# Patient Record
Sex: Female | Born: 1980 | Race: Black or African American | Hispanic: No | Marital: Single | Smoking: Never smoker
Health system: Southern US, Community
[De-identification: ages and names within clinical notes are randomized; demographics above are authoritative.]

## PROBLEM LIST (undated history)

## (undated) ENCOUNTER — Inpatient Hospital Stay (HOSPITAL_COMMUNITY): Payer: Self-pay

## (undated) DIAGNOSIS — H269 Unspecified cataract: Secondary | ICD-10-CM

## (undated) DIAGNOSIS — M1990 Unspecified osteoarthritis, unspecified site: Secondary | ICD-10-CM

## (undated) DIAGNOSIS — K219 Gastro-esophageal reflux disease without esophagitis: Secondary | ICD-10-CM

## (undated) DIAGNOSIS — F32A Depression, unspecified: Secondary | ICD-10-CM

## (undated) DIAGNOSIS — F419 Anxiety disorder, unspecified: Secondary | ICD-10-CM

## (undated) DIAGNOSIS — D649 Anemia, unspecified: Secondary | ICD-10-CM

## (undated) HISTORY — PX: EYE SURGERY: SHX253

## (undated) HISTORY — DX: Depression, unspecified: F32.A

## (undated) HISTORY — DX: Unspecified osteoarthritis, unspecified site: M19.90

## (undated) HISTORY — DX: Gastro-esophageal reflux disease without esophagitis: K21.9

## (undated) HISTORY — DX: Anxiety disorder, unspecified: F41.9

## (undated) HISTORY — DX: Anemia, unspecified: D64.9

## (undated) HISTORY — DX: Unspecified cataract: H26.9

---

## 2003-08-02 ENCOUNTER — Emergency Department (HOSPITAL_COMMUNITY): Admission: EM | Admit: 2003-08-02 | Discharge: 2003-08-02 | Payer: Self-pay | Admitting: Family Medicine

## 2004-05-04 ENCOUNTER — Emergency Department (HOSPITAL_COMMUNITY): Admission: EM | Admit: 2004-05-04 | Discharge: 2004-05-04 | Payer: Self-pay | Admitting: Family Medicine

## 2007-06-29 ENCOUNTER — Encounter: Admission: RE | Admit: 2007-06-29 | Discharge: 2007-06-29 | Payer: Self-pay | Admitting: Endocrinology

## 2011-11-01 ENCOUNTER — Ambulatory Visit (INDEPENDENT_AMBULATORY_CARE_PROVIDER_SITE_OTHER): Payer: BC Managed Care – PPO | Admitting: Physician Assistant

## 2011-11-01 VITALS — BP 128/84 | HR 86 | Temp 98.2°F | Resp 16 | Ht 67.5 in | Wt 257.0 lb

## 2011-11-01 DIAGNOSIS — J9801 Acute bronchospasm: Secondary | ICD-10-CM

## 2011-11-01 DIAGNOSIS — J019 Acute sinusitis, unspecified: Secondary | ICD-10-CM

## 2011-11-01 DIAGNOSIS — R05 Cough: Secondary | ICD-10-CM

## 2011-11-01 DIAGNOSIS — R059 Cough, unspecified: Secondary | ICD-10-CM

## 2011-11-01 DIAGNOSIS — H6690 Otitis media, unspecified, unspecified ear: Secondary | ICD-10-CM

## 2011-11-01 MED ORDER — FLUTICASONE PROPIONATE 50 MCG/ACT NA SUSP: 2.0000 | Freq: Every day | NASAL | Status: DC

## 2011-11-01 MED ORDER — AMOXICILLIN 500 MG PO CAPS: ORAL_CAPSULE | ORAL | Status: DC

## 2011-11-01 MED ORDER — ALBUTEROL SULFATE HFA 108 (90 BASE) MCG/ACT IN AERS: 2.0000 | INHALATION_SPRAY | Freq: Four times a day (QID) | RESPIRATORY_TRACT | Status: DC | PRN

## 2011-11-01 NOTE — Patient Instructions (Addendum)
-   Increase fluids and rest

## 2011-11-01 NOTE — Progress Notes (Signed)
  Subjective:    Patient ID: Patricia Boyer, female    DOB: 06/04/1980, 31 y.o.   MRN: [id number]  HPI 31 yr old AAF presents with a 5 day h/o sinus congestion, L ear pain, cough, wheezing, and she feels like she is getting worse.  Taking Aleve with little relief.  No f/c.  She has a h/o having to use an inhaler when she has URI.  She is a Proofreader.  She denies any health problems.  Review of Systems  All other systems reviewed and are negative.       Objective:   Physical Exam  Nursing note and vitals reviewed. Constitutional: She is oriented to person, place, and time. She appears well-developed and well-nourished.  HENT:  Head: Normocephalic and atraumatic.  Mouth/Throat: Oropharynx is clear and moist. No oropharyngeal exudate.       B TM bulging.  L is erythematous and landmarks slightly distorted.  Turbinates inflamed and greenish thick mucus in R nostril.  R maxillary TTP.  Cardiovascular: Normal rate, regular rhythm and normal heart sounds.   Pulmonary/Chest: Effort normal. No respiratory distress. She has wheezes (minimal wheezing with forced expirations.). She has no rales.  Neurological: She is alert and oriented to person, place, and time.  Skin: Skin is warm and dry.  Psychiatric: She has a normal mood and affect. Her behavior is normal.        Assessment & Plan:  Sinusitis with L AOM-add mucinex DM and sudafed. Fluids, rest.  See Rxs.  OOW tomorrow.

## 2011-11-13 ENCOUNTER — Telehealth: Payer: Self-pay

## 2011-11-13 NOTE — Telephone Encounter (Signed)
PATIENT STATES SHE SAW Patricia MCCLUNG A COUPLE OF WEEKS AGO FOR A SINUS INFECTION. SHE HAS FINISHED HER AMOXICILLIN AND IS STILL TAKING MUCINEX,FLONASE & SUDAFED. SHE STILL CONTINUES TO HAVE A SORE THROAT, HEAD & CHEST CONGESTION AND COUGH. SHOULD SHE GET A REFILL ON THE ANTIBOTIC OR BE PRESCRIBED SOMETHING ELSE? BEST PHONE [phone] (CELL)   PHARMACY CHOICE IS [facility] ON [address].  MBC

## 2011-11-14 MED ORDER — IPRATROPIUM BROMIDE 0.03 % NA SOLN: 2.0000 | Freq: Two times a day (BID) | NASAL | Status: DC

## 2011-11-14 MED ORDER — CEFDINIR 300 MG PO CAPS: 600.0000 mg | ORAL_CAPSULE | Freq: Every day | ORAL | Status: DC

## 2011-11-14 NOTE — Telephone Encounter (Signed)
Pt notified and agrees to RTC if not better in 5-7 days. JF

## 2011-11-14 NOTE — Telephone Encounter (Signed)
Please advise the patient to continue the Flonase.  I've sent in a different antibiotic and another nasal spray to ADD.  Rest, drink at least 64 ounces of water daily, and RTC if these additions do not improve/resolve her symptoms in the next 5-7 days, sooner if they worsen.

## 2012-05-17 ENCOUNTER — Ambulatory Visit (INDEPENDENT_AMBULATORY_CARE_PROVIDER_SITE_OTHER): Payer: BC Managed Care – PPO | Admitting: Internal Medicine

## 2012-05-17 ENCOUNTER — Ambulatory Visit: Payer: BC Managed Care – PPO

## 2012-05-17 VITALS — BP 141/71 | HR 85 | Temp 98.6°F | Resp 17 | Ht 68.0 in | Wt 253.0 lb

## 2012-05-17 DIAGNOSIS — R51 Headache: Secondary | ICD-10-CM

## 2012-05-17 DIAGNOSIS — R519 Headache, unspecified: Secondary | ICD-10-CM

## 2012-05-17 DIAGNOSIS — R5383 Other fatigue: Secondary | ICD-10-CM

## 2012-05-17 DIAGNOSIS — M542 Cervicalgia: Secondary | ICD-10-CM

## 2012-05-17 DIAGNOSIS — K219 Gastro-esophageal reflux disease without esophagitis: Secondary | ICD-10-CM

## 2012-05-17 DIAGNOSIS — R5381 Other malaise: Secondary | ICD-10-CM

## 2012-05-17 DIAGNOSIS — Z6838 Body mass index (BMI) 38.0-38.9, adult: Secondary | ICD-10-CM | POA: Insufficient documentation

## 2012-05-17 DIAGNOSIS — G4719 Other hypersomnia: Secondary | ICD-10-CM

## 2012-05-17 LAB — COMPREHENSIVE METABOLIC PANEL
ALT: 15 U/L (ref 0–35)
AST: 17 U/L (ref 0–37)
Calcium: 9.4 mg/dL (ref 8.4–10.5)
Chloride: 104 mEq/L (ref 96–112)
Creat: 0.91 mg/dL (ref 0.50–1.10)
Total Bilirubin: 0.7 mg/dL (ref 0.3–1.2)

## 2012-05-17 LAB — POCT CBC
Granulocyte percent: 69.5 %G (ref 37–80)
Hemoglobin: 14.7 g/dL (ref 12.2–16.2)
Lymph, poc: 1.6 (ref 0.6–3.4)
MCHC: 32.2 g/dL (ref 31.8–35.4)
MPV: 8.9 fL (ref 0–99.8)
POC Granulocyte: 4.7 (ref 2–6.9)
POC MID %: 7.1 %M (ref 0–12)

## 2012-05-17 LAB — POCT SEDIMENTATION RATE: POCT SED RATE: 13 mm/hr (ref 0–22)

## 2012-05-17 MED ORDER — CYCLOBENZAPRINE HCL 10 MG PO TABS: 10.0000 mg | ORAL_TABLET | Freq: Every day | ORAL | Status: DC

## 2012-05-17 MED ORDER — BUTALBITAL-APAP-CAFFEINE 50-325-40 MG PO TABS: 1.0000 | ORAL_TABLET | Freq: Two times a day (BID) | ORAL | Status: DC | PRN

## 2012-05-17 MED ORDER — MELOXICAM 15 MG PO TABS: 15.0000 mg | ORAL_TABLET | Freq: Every day | ORAL | Status: DC
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
## 2012-05-19 ENCOUNTER — Encounter: Payer: Self-pay | Admitting: Internal Medicine

## 2012-05-19 MED ORDER — ESOMEPRAZOLE MAGNESIUM 40 MG PO CPDR: 40.0000 mg | DELAYED_RELEASE_CAPSULE | Freq: Every day | ORAL | Status: DC

## 2012-05-19 NOTE — Addendum Note (Signed)
Addended by: Tonye Pearson on: 05/19/2012 01:33 PM   Modules accepted: Orders

## 2012-05-28 ENCOUNTER — Telehealth: Payer: Self-pay

## 2012-05-28 NOTE — Telephone Encounter (Signed)
PT STATES SHE HAD TESTS RUN AND WOULD LIKE TO KNOW RESULTS. SHE IS STILL REALLY HOARSE AND DIDN'T KNOW WHAT TO DO. PLEASE CALL PT AT [phone]

## 2012-05-28 NOTE — Telephone Encounter (Signed)
Left a message for patient to return call. Dr. Merla Riches, can you please comment on the labs on 05/17/12

## 2012-05-29 ENCOUNTER — Telehealth: Payer: Self-pay | Admitting: Radiology

## 2012-05-29 NOTE — Telephone Encounter (Signed)
Thanks, I have advised patient of lab results.

## 2012-05-29 NOTE — Telephone Encounter (Signed)
Spoke to patient regarding her complaints, she states she has pain with her neck/ head. She states she can not take Meloxicam at work because it makes her tired, when she takes it , it is helpful. She states her voice is hoarse. Advised not normal to have hoarseness for 2 weeks. I did advise her labs normal, you can disregard the previous message regarding them. Please advise, she is very concerned about her voice. I think she should return to clinic, but she advised she plans to come in as planned with you next week, Noelle Hoogland

## 2012-05-29 NOTE — Telephone Encounter (Signed)
Please see the letter I sent her

## 2012-06-02 ENCOUNTER — Encounter: Payer: Self-pay | Admitting: Internal Medicine

## 2012-06-02 ENCOUNTER — Ambulatory Visit (INDEPENDENT_AMBULATORY_CARE_PROVIDER_SITE_OTHER): Payer: BC Managed Care – PPO | Admitting: Internal Medicine

## 2012-06-02 VITALS — BP 122/88 | HR 78 | Temp 98.4°F | Resp 16 | Ht 68.0 in | Wt 259.0 lb

## 2012-06-02 DIAGNOSIS — R490 Dysphonia: Secondary | ICD-10-CM

## 2012-06-02 NOTE — Progress Notes (Signed)
  Subjective:    Patient ID: Patricia Boyer, female    DOB: 15-Apr-1980, 32 y.o.   MRN: [id number]  HPI See last ov Still hoarse all day long Labs were all normal nexium started last ov--belching better but has recurred last 2 days/did not have classic reflux sxt No allergic rhinitis  No cough   Review of Systems Insomnia thurs to sat last week for ? reason    Objective:   Physical Exam BP 122/88  Pulse 78  Temp(Src) 98.4 F (36.9 C)  Resp 16  Ht 5\' 8"  (1.727 m)  Wt 259 lb (117.482 kg)  BMI 39.39 kg/m2  LMP 05/17/2012 Perrla/conj clear Tms clear Nares boggy  thr clear No nodes or thyromeg Lungs clear       Assessment & Plan:  #1 persistent hoarseness To ENT next

## 2012-06-18 ENCOUNTER — Ambulatory Visit (INDEPENDENT_AMBULATORY_CARE_PROVIDER_SITE_OTHER): Payer: BC Managed Care – PPO | Admitting: Family Medicine

## 2012-06-18 VITALS — BP 140/94 | HR 85 | Temp 97.9°F | Resp 18 | Ht 68.0 in | Wt 263.0 lb

## 2012-06-18 DIAGNOSIS — J329 Chronic sinusitis, unspecified: Secondary | ICD-10-CM

## 2012-06-18 MED ORDER — FLUTICASONE PROPIONATE 50 MCG/ACT NA SUSP: 2.0000 | Freq: Every day | NASAL | Status: DC

## 2012-06-18 MED ORDER — AMOXICILLIN-POT CLAVULANATE 875-125 MG PO TABS: 1.0000 | ORAL_TABLET | Freq: Two times a day (BID) | ORAL | Status: DC

## 2012-06-18 NOTE — Progress Notes (Signed)
Patient is here for followup of her continued hoarseness. Patient was seen by ear nose and throat who stated that she did have inflammation of her vocal cords but they felt it would go away. They did not see any cyst. Patient continues to take the reflex medication without any side effects. Patient has changed her diet and states that this has not made any improvement either. Patient now states that she is starting to cough and had a significant amount of sinus congestion. Patient feels like she is attempting to get cold. Patient denies any trouble with swallowing and denies any shortness of breath. Patient does states that she is starting to get a headache more than usual. Denies any changes in vision.  Past medical history, social, surgical and family history all reviewed.   Review of systems is negative for fever, chills, any abnormal weight loss.  Physical Exam:  Blood pressure 140/94, pulse 85, temperature 97.9 F (36.6 C), temperature source Oral, resp. rate 18, height 5\' 8"  (1.727 m), weight 263 lb (119.296 kg), last menstrual period 05/05/2012, SpO2 100.00%. General: NAD, alert HEENT:  Posterior pharynx is erythremic with PND, TM visualized bilaterally with air-fluid levels. Patient does have tenderness to the maxillary sinuses bilaterally. Pupils are equal round reactive to light and accommodation, Patient does have some thyromegaly with no nodule present. extraocular movements intact. Moist mucous membranes Pulmonary: Clear to auscultation bilaterally Cardiac: Regular rate and rhythm no murmur appreciated Skin: No rash present.   Assessment:  Continued horseness with findings of vocal cord irratation by ENT evaluation and now developing Sinusitis.   Plan: Will treat as sinusitis that is occuring due to duration of symptoms, flonase for PND which may help with and likely seasonal allergies.  Patient will come back again in 3 weeks for further evaluation if contnue to have symptoms.

## 2012-06-18 NOTE — Patient Instructions (Signed)
I do think you will get better but it does appear you are developing sinus infection.   Try the medications I sent in for you Come back in 3 weeks if not better.   Sinusitis Sinusitis is redness, soreness, and swelling (inflammation) of the paranasal sinuses. Paranasal sinuses are air pockets within the bones of your face (beneath the eyes, the middle of the forehead, or above the eyes). In healthy paranasal sinuses, mucus is able to drain out, and air is able to circulate through them by way of your nose. However, when your paranasal sinuses are inflamed, mucus and air can become trapped. This can allow bacteria and other germs to grow and cause infection. Sinusitis can develop quickly and last only a short time (acute) or continue over a long period (chronic). Sinusitis that lasts for more than 12 weeks is considered chronic.  CAUSES  Causes of sinusitis include:  Allergies.  Structural abnormalities, such as displacement of the cartilage that separates your nostrils (deviated septum), which can decrease the air flow through your nose and sinuses and affect sinus drainage.  Functional abnormalities, such as when the small hairs (cilia) that line your sinuses and help remove mucus do not work properly or are not present. SYMPTOMS  Symptoms of acute and chronic sinusitis are the same. The primary symptoms are pain and pressure around the affected sinuses. Other symptoms include:  Upper toothache.  Earache.  Headache.  Bad breath.  Decreased sense of smell and taste.  A cough, which worsens when you are lying flat.  Fatigue.  Fever.  Thick drainage from your nose, which often is green and may contain pus (purulent).  Swelling and warmth over the affected sinuses. DIAGNOSIS  Your caregiver will perform a physical exam. During the exam, your caregiver may:  Look in your nose for signs of abnormal growths in your nostrils (nasal polyps).  Tap over the affected sinus to check  for signs of infection.  View the inside of your sinuses (endoscopy) with a special imaging device with a light attached (endoscope), which is inserted into your sinuses. If your caregiver suspects that you have chronic sinusitis, one or more of the following tests may be recommended:  Allergy tests.  Nasal culture A sample of mucus is taken from your nose and sent to a lab and screened for bacteria.  Nasal cytology A sample of mucus is taken from your nose and examined by your caregiver to determine if your sinusitis is related to an allergy. TREATMENT  Most cases of acute sinusitis are related to a viral infection and will resolve on their own within 10 days. Sometimes medicines are prescribed to help relieve symptoms (pain medicine, decongestants, nasal steroid sprays, or saline sprays).  However, for sinusitis related to a bacterial infection, your caregiver will prescribe antibiotic medicines. These are medicines that will help kill the bacteria causing the infection.  Rarely, sinusitis is caused by a fungal infection. In theses cases, your caregiver will prescribe antifungal medicine. For some cases of chronic sinusitis, surgery is needed. Generally, these are cases in which sinusitis recurs more than 3 times per year, despite other treatments. HOME CARE INSTRUCTIONS   Drink plenty of water. Water helps thin the mucus so your sinuses can drain more easily.  Use a humidifier.  Inhale steam 3 to 4 times a day (for example, sit in the bathroom with the shower running).  Apply a warm, moist washcloth to your face 3 to 4 times a day, or as  directed by your caregiver.  Use saline nasal sprays to help moisten and clean your sinuses.  Take over-the-counter or prescription medicines for pain, discomfort, or fever only as directed by your caregiver. SEEK IMMEDIATE MEDICAL CARE IF:  You have increasing pain or severe headaches.  You have nausea, vomiting, or drowsiness.  You have  swelling around your face.  You have vision problems.  You have a stiff neck.  You have difficulty breathing. MAKE SURE YOU:   Understand these instructions.  Will watch your condition.  Will get help right away if you are not doing well or get worse. Document Released: 01/29/2005 Document Revised: 04/23/2011 Document Reviewed: 02/13/2011 [facility] Patient Information 2013 [place], [place].

## 2012-06-19 ENCOUNTER — Telehealth: Payer: Self-pay

## 2012-06-19 MED ORDER — BENZONATATE 100 MG PO CAPS: 100.0000 mg | ORAL_CAPSULE | Freq: Three times a day (TID) | ORAL | Status: DC | PRN

## 2012-06-19 NOTE — Telephone Encounter (Signed)
Patient calling again regarding cough medication.  No cough medication prescribed at visit last night.  Tessalon Perles escribed to pharmacy.

## 2012-06-19 NOTE — Telephone Encounter (Signed)
PATIENT STATES SHE SAW DR. ZACK SMITH LAST NIGHT FOR A SINUS INFECTION. HE GAVE HER AUGMENTIN AND A NASAL SPRAY. SHE TOLD HIM SHE HAD A COUGH ALSO. SHE WANTS TO KNOW IF EITHER ONE OF THESE MEDICATIONS WILL HELP HER COUGH? THE "OVER-THE-COUNTER" MEDICATIONS ARE NOT WORKING. IF THESE MEDS. HE GAVE HER LAST NIGHT DOES NOT HELP, PLEASE CALL SOMETHING INTO THE PHARMACY FOR HER PLEASE. BEST PHONE [phone] (CELL)   PHARMACY CHOICE IS [facility] ON [address].   MBC

## 2012-06-19 NOTE — Telephone Encounter (Signed)
Please advise on Cough meds for her.

## 2013-01-23 ENCOUNTER — Encounter (HOSPITAL_COMMUNITY): Payer: Self-pay | Admitting: Emergency Medicine

## 2013-01-23 ENCOUNTER — Emergency Department (HOSPITAL_COMMUNITY)
Admission: EM | Admit: 2013-01-23 | Discharge: 2013-01-23 | Disposition: A | Discharge status: Home / Self Care | Payer: BC Managed Care – PPO | Source: Home / Self Care | Attending: Emergency Medicine | Admitting: Emergency Medicine

## 2013-01-23 DIAGNOSIS — J111 Influenza due to unidentified influenza virus with other respiratory manifestations: Secondary | ICD-10-CM

## 2013-01-23 MED ORDER — TRAMADOL HCL 50 MG PO TABS: 100.0000 mg | ORAL_TABLET | Freq: Three times a day (TID) | ORAL | Status: DC | PRN

## 2013-01-23 MED ORDER — ALBUTEROL SULFATE HFA 108 (90 BASE) MCG/ACT IN AERS: 2.0000 | INHALATION_SPRAY | RESPIRATORY_TRACT | Status: DC | PRN

## 2013-01-23 MED ORDER — HYDROCOD POLST-CHLORPHEN POLST 10-8 MG/5ML PO LQCR: 5.0000 mL | Freq: Two times a day (BID) | ORAL | Status: DC | PRN

## 2013-01-23 MED ORDER — OSELTAMIVIR PHOSPHATE 75 MG PO CAPS: 75.0000 mg | ORAL_CAPSULE | Freq: Two times a day (BID) | ORAL | Status: DC

## 2013-01-23 NOTE — ED Notes (Signed)
Pt  Reports  Symptoms  Of  Body  Aches  Chills  And  Fever   With  sorethroat         And  Burning sensation  -  Upper  Chest        Symptoms  Began today

## 2013-01-23 NOTE — ED Provider Notes (Signed)
Chief Complaint:   Chief Complaint  Patient presents with  . 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 She record her temperature of 99.5. She has not gotten the flu vaccine this year. She has been exposed to many students who had a febrile illness with cough. She denies any nasal congestion, rhinorrhea, or GI symptoms.  Review of Systems:  Other than noted above, the patient denies any of the following symptoms: Systemic:  No fevers, chills, sweats, weight loss or gain, fatigue, or tiredness. Eye:  No redness or discharge. ENT:  No ear pain, drainage, headache, nasal congestion, drainage, sinus pressure, difficulty swallowing, or sore throat. Neck:  No neck pain or swollen glands. Lungs:  No cough, sputum production, hemoptysis, wheezing, chest tightness, shortness of breath or chest pain. GI:  No abdominal pain, nausea, vomiting or diarrhea.  PMFSH:  Past medical history, family history, social history, meds, and allergies were reviewed. She takes birth control pills. 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  Physical Exam:   Vital signs:  BP 143/77  Pulse 105  Temp(Src) 99.2 F (37.3 C) (Oral)  Resp 18  SpO2 100%  LMP 12/26/2012 General:  Alert and oriented.  In no distress.  Skin warm and dry. Eye:  No conjunctival injection or drainage. Lids were normal. ENT:  TMs and canals were normal, without erythema or inflammation.  Nasal mucosa was clear and uncongested, without drainage.  Mucous membranes were moist.  Pharynx was clear with no exudate or drainage.  There were no oral ulcerations or lesions. Neck:  Supple, no adenopathy, tenderness or mass. Lungs:  No respiratory distress.  Lungs were clear to auscultation, without wheezes, rales or rhonchi.  Breath sounds were clear and equal  bilaterally.  Heart:  Regular rhythm, without gallops, murmers or rubs. Skin:  Clear, warm, and dry, without rash or lesions.   Assessment:  The encounter diagnosis was Influenza-like illness.  No indication for antibiotics. Since this is the first day of illness, she would be a candidate for the Tamiflu.  Plan:   1.  Meds:  The following meds were prescribed:   New Prescriptions   ALBUTEROL (PROVENTIL HFA;VENTOLIN HFA) 108 (90 BASE) MCG/ACT INHALER    Inhale 2 puffs into the lungs every 4 (four) hours as needed for wheezing or shortness of breath.   CHLORPHENIRAMINE-HYDROCODONE (TUSSIONEX) 10-8 MG/5ML LQCR    Take 5 mLs by mouth every 12 (twelve) hours as needed for cough.   OSELTAMIVIR (TAMIFLU) 75 MG CAPSULE    Take 1 capsule (75 mg total) by mouth every 12 (twelve) hours.   TRAMADOL (ULTRAM) 50 MG TABLET    Take 2 tablets (100 mg total) by mouth every 8 (eight) hours as needed.    2.  Patient Education/Counseling:  The patient was given appropriate handouts, self care instructions, and instructed in symptomatic relief.    3.  Follow up:  The patient was told to follow up if no better in 3 to 4 days, if becoming worse in any way, and given some red flag symptoms such as difficulty breathing or persistent vomiting which would prompt immediate return.  Follow up here as needed.      Reuben Likes, MD 01/23/13 2025

## 2013-03-11 ENCOUNTER — Ambulatory Visit (INDEPENDENT_AMBULATORY_CARE_PROVIDER_SITE_OTHER): Payer: BC Managed Care – PPO | Admitting: Emergency Medicine

## 2013-03-11 VITALS — BP 130/100 | HR 60 | Temp 98.4°F | Resp 16 | Ht 68.0 in | Wt 265.0 lb

## 2013-03-11 DIAGNOSIS — R519 Headache, unspecified: Secondary | ICD-10-CM

## 2013-03-11 DIAGNOSIS — G44209 Tension-type headache, unspecified, not intractable: Secondary | ICD-10-CM

## 2013-03-11 DIAGNOSIS — G4733 Obstructive sleep apnea (adult) (pediatric): Secondary | ICD-10-CM

## 2013-03-11 DIAGNOSIS — I10 Essential (primary) hypertension: Secondary | ICD-10-CM

## 2013-03-11 DIAGNOSIS — R51 Headache: Secondary | ICD-10-CM

## 2013-03-11 MED ORDER — HYDROCHLOROTHIAZIDE 25 MG PO TABS: 25.0000 mg | ORAL_TABLET | Freq: Every day | ORAL | Status: DC

## 2013-03-11 MED ORDER — BUTALBITAL-APAP-CAFFEINE 50-325-40 MG PO TABS: 1.0000 | ORAL_TABLET | Freq: Two times a day (BID) | ORAL | Status: DC | PRN

## 2013-03-11 MED ORDER — BUTALBITAL-APAP-CAFFEINE 50-325-40 MG PO TABS: 1.0000 | ORAL_TABLET | Freq: Four times a day (QID) | ORAL | Status: DC | PRN

## 2013-03-11 NOTE — Progress Notes (Signed)
[facility] [address], [place] [address] [phone]  Date:  03/11/2013   Name:  Patricia Boyer   DOB:  1980-11-13   MRN:  [id number]  PCP:  No PCP Per Patient    Chief Complaint: Headache and Hypertension   History of Present Illness:  Patricia Boyer is a 33 y.o. very pleasant female patient who presents with the following:  Three week history of increased headaches in back of head.  Last all day.  Not associated with neuro or visual symptoms.  Was told her blood pressure was elevated and came in to be checked.  She has a history of prior headaches and elevated pressure associated with excessive daytime sleepiness and need to nap on arrival home from work.  Denies snoring.  Was prescribed fioricet but can't remember result of treatment.  No nausea or vomiting, no cough or coryza.  No neuro symptoms related to headache.  No excess use of caffeine, alcohol, doesn't cook with salt.  No improvement with over the counter medications or other home remedies. Denies other complaint or health concern today.   Patient Active Problem List   Diagnosis Date Noted  . BMI 38.0-38.9,adult 05/17/2012    Past Medical History  Diagnosis Date  . Arthritis   . Anemia   . Cataract   . GERD (gastroesophageal reflux disease)     History reviewed. No pertinent past surgical history.  History  Substance Use Topics  . Smoking status: Never Smoker   . Smokeless tobacco: Not on file  . Alcohol Use: 0.6 oz/week    1 Cans of beer per week    Family History  Problem Relation Age of Onset  . Hypertension Mother     No Known Allergies  Medication list has been reviewed and updated.  Current Outpatient Prescriptions on File Prior to Visit  Medication Sig Dispense Refill  . esomeprazole (NEXIUM) 40 MG capsule Take 1 capsule (40 mg total) by mouth daily.  30 capsule  3  . fluticasone (FLONASE) 50 MCG/ACT nasal spray Place 2 sprays into the nose daily.  16 g  6  .  albuterol (PROVENTIL HFA;VENTOLIN HFA) 108 (90 BASE) MCG/ACT inhaler Inhale 2 puffs into the lungs every 4 (four) hours as needed for wheezing or shortness of breath.  1 Inhaler  0  . butalbital-acetaminophen-caffeine (FIORICET, ESGIC) 50-325-40 MG per tablet Take 1 tablet by mouth 2 (two) times daily as needed for headache.  14 tablet  0  . cyclobenzaprine (FLEXERIL) 10 MG tablet Take 1 tablet (10 mg total) by mouth at bedtime.  30 tablet  0  . meloxicam (MOBIC) 15 MG tablet Take 1 tablet (15 mg total) by mouth daily.  30 tablet  0  . Norethin Ace-Eth Estrad-FE (JUNEL FE 1/20 PO) Take by mouth.      . traMADol (ULTRAM) 50 MG tablet Take 2 tablets (100 mg total) by mouth every 8 (eight) hours as needed.  30 tablet  0   No current facility-administered medications on file prior to visit.    Review of Systems:  As per HPI, otherwise negative.    Physical Examination: Filed Vitals:   03/11/13 1931  BP: 130/100  Pulse: 60  Temp: 98.4 F (36.9 C)  Resp: 16   Filed Vitals:   03/11/13 1931  Height: 5\' 8"  (1.727 m)  Weight: 265 lb (120.203 kg)   Body mass index is 40.3 kg/(m^2). Ideal Body Weight: Weight in (lb) to have  BMI = 25: 164.1  GEN: very obese, NAD, Non-toxic, A & O x 3 HEENT: Atraumatic, Normocephalic. Neck supple. No masses, No LAD. Ears and Nose: No external deformity. CV: RRR, No M/G/R. No JVD. No thrill. No extra heart sounds. PULM: CTA B, no wheezes, crackles, rhonchi. No retractions. No resp. distress. No accessory muscle use. ABD: S, NT, ND, +BS. No rebound. No HSM. EXTR: No c/c/e NEURO Normal gait.  PSYCH: Normally interactive. Conversant. Not depressed or anxious appearing.  Calm demeanor.  Tender posterior neck  Assessment and Plan: Tension headache Borderline hypertension HCTZ ?sleep apnea  Signed,  Phillips OdorJeffery Mohmed Farver, MD

## 2013-03-11 NOTE — Patient Instructions (Signed)
Sleep Apnea  Sleep apnea is a sleep disorder characterized by abnormal pauses in breathing while you sleep. When your breathing pauses, the level of oxygen in your blood decreases. This causes you to move out of deep sleep and into light sleep. As a result, your quality of sleep is poor, and the system that carries your blood throughout your body (cardiovascular system) experiences stress. If sleep apnea remains untreated, the following conditions can develop:  High blood pressure (hypertension).  Coronary artery disease.  Inability to achieve or maintain an erection (impotence).  Impairment of your thought process (cognitive dysfunction). There are three types of sleep apnea: 1. Obstructive sleep apnea Pauses in breathing during sleep because of a blocked airway. 2. Central sleep apnea Pauses in breathing during sleep because the area of the brain that controls your breathing does not send the correct signals to the muscles that control breathing. 3. Mixed sleep apnea A combination of both obstructive and central sleep apnea. RISK FACTORS The following risk factors can increase your risk of developing sleep apnea:  Being overweight.  Smoking.  Having narrow passages in your nose and throat.  Being of older age.  Being female.  Alcohol use.  Sedative and tranquilizer use.  Ethnicity. Among individuals younger than 35 years, African Americans are at increased risk of sleep apnea. SYMPTOMS   Difficulty staying asleep.  Daytime sleepiness and fatigue.  Loss of energy.  Irritability.  Loud, heavy snoring.  Morning headaches.  Trouble concentrating.  Forgetfulness.  Decreased interest in sex. DIAGNOSIS  In order to diagnose sleep apnea, your caregiver will perform a physical examination. Your caregiver may suggest that you take a home sleep test. Your caregiver may also recommend that you spend the night in a sleep lab. In the sleep lab, several monitors record  information about your heart, lungs, and brain while you sleep. Your leg and arm movements and blood oxygen level are also recorded. TREATMENT The following actions may help to resolve mild sleep apnea:  Sleeping on your side.   Using a decongestant if you have nasal congestion.   Avoiding the use of depressants, including alcohol, sedatives, and narcotics.   Losing weight and modifying your diet if you are overweight. There also are devices and treatments to help open your airway:  Oral appliances. These are custom-made mouthpieces that shift your lower jaw forward and slightly open your bite. This opens your airway.  Devices that create positive airway pressure. This positive pressure "splints" your airway open to help you breathe better during sleep. The following devices create positive airway pressure:  Continuous positive airway pressure (CPAP) device. The CPAP device creates a continuous level of air pressure with an air pump. The air is delivered to your airway through a mask while you sleep. This continuous pressure keeps your airway open.  Nasal expiratory positive airway pressure (EPAP) device. The EPAP device creates positive air pressure as you exhale. The device consists of single-use valves, which are inserted into each nostril and held in place by adhesive. The valves create very little resistance when you inhale but create much more resistance when you exhale. That increased resistance creates the positive airway pressure. This positive pressure while you exhale keeps your airway open, making it easier to breath when you inhale again.  Bilevel positive airway pressure (BPAP) device. The BPAP device is used mainly in patients with central sleep apnea. This device is similar to the CPAP device because it also uses an air pump to deliver  continuous air pressure through a mask. However, with the BPAP machine, the pressure is set at two different levels. The pressure when you  exhale is lower than the pressure when you inhale.  Surgery. Typically, surgery is only done if you cannot comply with less invasive treatments or if the less invasive treatments do not improve your condition. Surgery involves removing excess tissue in your airway to create a wider passage way. Document Released: 01/19/2002 Document Revised: 05/26/2012 Document Reviewed: 06/07/2011 [facility] Patient Information 2014 [place], [place]. Hypertension As your heart beats, it forces blood through your arteries. This force is your blood pressure. If the pressure is too high, it is called hypertension (HTN) or high blood pressure. HTN is dangerous because you may have it and not know it. High blood pressure may mean that your heart has to work harder to pump blood. Your arteries may be narrow or stiff. The extra work puts you at risk for heart disease, stroke, and other problems.  Blood pressure consists of two numbers, a higher number over a lower, 110/72, for example. It is stated as "110 over 72." The ideal is below 120 for the top number (systolic) and under 80 for the bottom (diastolic). Write down your blood pressure today. You should pay close attention to your blood pressure if you have certain conditions such as:  Heart failure.  Prior heart attack.  Diabetes  Chronic kidney disease.  Prior stroke.  Multiple risk factors for heart disease. To see if you have HTN, your blood pressure should be measured while you are seated with your arm held at the level of the heart. It should be measured at least twice. A one-time elevated blood pressure reading (especially in the Emergency Department) does not mean that you need treatment. There may be conditions in which the blood pressure is different between your right and left arms. It is important to see your caregiver soon for a recheck. Most people have essential hypertension which means that there is not a specific cause. This type of high blood  pressure may be lowered by changing lifestyle factors such as:  Stress.  Smoking.  Lack of exercise.  Excessive weight.  Drug/tobacco/alcohol use.  Eating less salt. Most people do not have symptoms from high blood pressure until it has caused damage to the body. Effective treatment can often prevent, delay or reduce that damage. TREATMENT  When a cause has been identified, treatment for high blood pressure is directed at the cause. There are a large number of medications to treat HTN. These fall into several categories, and your caregiver will help you select the medicines that are best for you. Medications may have side effects. You should review side effects with your caregiver. If your blood pressure stays high after you have made lifestyle changes or started on medicines,   Your medication(s) may need to be changed.  Other problems may need to be addressed.  Be certain you understand your prescriptions, and know how and when to take your medicine.  Be sure to follow up with your caregiver within the time frame advised (usually within two weeks) to have your blood pressure rechecked and to review your medications.  If you are taking more than one medicine to lower your blood pressure, make sure you know how and at what times they should be taken. Taking two medicines at the same time can result in blood pressure that is too low. SEEK IMMEDIATE MEDICAL CARE IF:  You develop a severe headache,  blurred or changing vision, or confusion.  You have unusual weakness or numbness, or a faint feeling.  You have severe chest or abdominal pain, vomiting, or breathing problems. MAKE SURE YOU:   Understand these instructions.  Will watch your condition.  Will get help right away if you are not doing well or get worse. Document Released: 01/29/2005 Document Revised: 04/23/2011 Document Reviewed: 09/19/2007 [facility] Patient Information 2014 [place], [place].

## 2013-03-20 ENCOUNTER — Encounter (INDEPENDENT_AMBULATORY_CARE_PROVIDER_SITE_OTHER): Payer: Self-pay

## 2013-03-20 ENCOUNTER — Ambulatory Visit (INDEPENDENT_AMBULATORY_CARE_PROVIDER_SITE_OTHER): Payer: BC Managed Care – PPO | Admitting: Neurology

## 2013-03-20 ENCOUNTER — Encounter: Payer: Self-pay | Admitting: Neurology

## 2013-03-20 VITALS — BP 125/83 | HR 83 | Temp 96.6°F | Ht 68.0 in | Wt 260.0 lb

## 2013-03-20 DIAGNOSIS — I10 Essential (primary) hypertension: Secondary | ICD-10-CM

## 2013-03-20 DIAGNOSIS — G4710 Hypersomnia, unspecified: Secondary | ICD-10-CM

## 2013-03-20 DIAGNOSIS — R51 Headache: Secondary | ICD-10-CM

## 2013-03-20 DIAGNOSIS — E669 Obesity, unspecified: Secondary | ICD-10-CM

## 2013-03-20 DIAGNOSIS — R519 Headache, unspecified: Secondary | ICD-10-CM

## 2013-03-20 DIAGNOSIS — R400 Somnolence: Secondary | ICD-10-CM

## 2013-03-20 NOTE — Patient Instructions (Signed)

## 2013-03-20 NOTE — Progress Notes (Signed)
Subjective:    Patient ID: Patricia Boyer is a 33 y.o. female.  HPI    Patricia Foley, MD, PhD [facility] [address], [address] [address] [place], [place] [address]   Dear Dr. Dareen Piano,  I saw your patient, Patricia Boyer, upon your kind request in my neurologic clinic today for initial consultation of her sleep disorder, in particular, concern for obstructive sleep apnea. The patient is unaccompanied today. As you know, Patricia Boyer is a very pleasant 33 year old right-handed woman with an underlying medical history of hypertension, reflux disease, arthritis, anemia, cataracts, asthma, LBP, obesity and recurrent headaches, who recently saw you on 03/11/2013 for recurrent headaches as well as high blood pressure. Her blood pressure was indeed elevated at the time at 130/100. She reported increased frequency of headaches which are posterior in location and reported excessive daytime somnolence. She started having a HA about a month and 2 ago and someone suggested checking her BP and she noted, it was indeed up. She has gained weight over the last years and was treated with high dose steroids with slow wean about 4 years ago for LBP. She goes to bed at 11 PM to MN and falls asleep within minutes. She wakes up tired and has to wake up at 6 AM, but has overslept recently. She has EDS and and ESS of 8/24 today. She feels sleepy at work. She falls asleep often on the couch between 5-8 PM after she comes home from work. She lives alone and has no children. She works FT as a Runner, broadcasting/film/video at [employer]. She does not smoke and drinks EtOH once a week. She does not take illicits. She does not have RLS Sx and is not known to kick in her sleep. She has had AM HAs, especially last month. She has been started on HCTZ on 03/11/13. Her BP is better and she had it check at school by the school nurse as well and her HAs are better, but not gone. She has taken Fioricet infrequently. She drinks  caffeine, coffee in AM. She has not been told that she snores, but snores when congested. She denies gasping for air at night and has not been reported to have apneas. She sleeps with the TV on. She sleeps alone for the most part and has no pets. Her mother has been diagnosed with OSA.   Her Past Medical History Is Significant For: Past Medical History  Diagnosis Date  . Arthritis   . Anemia   . Cataract   . GERD (gastroesophageal reflux disease)     Her Past Surgical History Is Significant For: History reviewed. No pertinent past surgical history.  Her Family History Is Significant For: Family History  Problem Relation Age of Onset  . Hypertension Mother     Her Social History Is Significant For: History   Social History  . Marital Status: Single    Spouse Name: N/A    Number of Children: N/A  . Years of Education: N/A   Social History Main Topics  . Smoking status: Never Smoker   . Smokeless tobacco: None  . Alcohol Use: 0.6 oz/week    1 Cans of beer per week  . Drug Use: No  . Sexual Activity: No   Other Topics Concern  . None   Social History Narrative  . None    Her Allergies Are:  No Known Allergies:   Her Current Medications Are:  Outpatient Encounter Prescriptions as of 03/20/2013  Medication  Sig  . butalbital-acetaminophen-caffeine (FIORICET, ESGIC) 50-325-40 MG per tablet Take 1-2 tablets by mouth every 6 (six) hours as needed for headache.  . hydrochlorothiazide (HYDRODIURIL) 25 MG tablet Take 1 tablet (25 mg total) by mouth daily.  . Multiple Vitamins-Minerals (WOMENS MULTI PO) Take by mouth.  Marland Kitchen albuterol (PROVENTIL HFA;VENTOLIN HFA) 108 (90 BASE) MCG/ACT inhaler Inhale 2 puffs into the lungs every 4 (four) hours as needed for wheezing or shortness of breath.  . cyclobenzaprine (FLEXERIL) 10 MG tablet Take 1 tablet (10 mg total) by mouth at bedtime.  Marland Kitchen esomeprazole (NEXIUM) 40 MG capsule Take 1 capsule (40 mg total) by mouth daily.  . fluticasone  (FLONASE) 50 MCG/ACT nasal spray Place 2 sprays into the nose daily.  . meloxicam (MOBIC) 15 MG tablet Take 1 tablet (15 mg total) by mouth daily.  . Norethin Ace-Eth Estrad-FE (JUNEL FE 1/20 PO) Take by mouth.  . traMADol (ULTRAM) 50 MG tablet Take 2 tablets (100 mg total) by mouth every 8 (eight) hours as needed.   Review of Systems:  Out of a complete 14 point review of systems, all are reviewed and negative with the exception of these symptoms as listed below:   Review of Systems  Constitutional: Positive for fatigue.  Eyes: Negative.   Respiratory: Negative.   Cardiovascular: Negative.   Gastrointestinal: Negative.   Endocrine: Positive for heat intolerance.  Genitourinary: Negative.   Musculoskeletal: Negative.   Skin: Negative.   Allergic/Immunologic: Positive for environmental allergies.  Neurological: Positive for headaches.  Hematological: Negative.   Psychiatric/Behavioral: Positive for confusion and sleep disturbance (not enough sleep).    Objective:  Neurologic Exam  Physical Exam Physical Examination:   Filed Vitals:   03/20/13 1037  BP: 125/83  Pulse: 83  Temp: 96.6 F (35.9 C)    General Examination: The patient is a very pleasant 33 y.o. female in no acute distress. She appears well-developed and well-nourished and well groomed.   HEENT: Normocephalic, atraumatic, pupils are equal, round and reactive to light and accommodation. Funduscopic exam is normal with sharp disc margins noted. Extraocular tracking is good without limitation to gaze excursion or nystagmus noted. She has a L esotropia. Normal smooth pursuit is noted. Hearing is grossly intact. Tympanic membranes are clear bilaterally. Face is symmetric with normal facial animation and normal facial sensation. Speech is clear with no dysarthria noted. There is no hypophonia. There is no lip, neck/head, jaw or voice tremor. Neck is supple with full range of passive and active motion. There are no carotid  bruits on auscultation. Oropharynx exam reveals: mild mouth dryness, adequate dental hygiene and mild airway crowding, due to larger tongue and tonsils in place. Mallampati is class II. Tongue protrudes centrally and palate elevates symmetrically. Tonsils are 1+ in size. Neck size is 15 inches. She has a tiny overbite. Nasal inspection reveals fairly significant nasal mucosal bogginess, mild redness and no septal deviation and she has evidence of inferior turbinate hypertrophy.   Chest: Clear to auscultation without wheezing, rhonchi or crackles noted.  Heart: S1+S2+0, regular and normal without murmurs, rubs or gallops noted.   Abdomen: Soft, non-tender and non-distended with normal bowel sounds appreciated on auscultation.  Extremities: There is no pitting edema in the distal lower extremities bilaterally. Pedal pulses are intact.  Skin: Warm and dry without trophic changes noted. There are no varicose veins.  Musculoskeletal: exam reveals no obvious joint deformities, tenderness or joint swelling or erythema.   Neurologically:  Mental status: The patient is awake,  alert and oriented in all 4 spheres. Her immediate and remote memory, attention, language skills and fund of knowledge are appropriate. There is no evidence of aphasia, agnosia, apraxia or anomia. Speech is clear with normal prosody and enunciation. Thought process is linear. Mood is normal and affect is normal.  Cranial nerves II - XII are as described above under HEENT exam. In addition: shoulder shrug is normal with equal shoulder height noted. Motor exam: Normal bulk, strength and tone is noted. There is no drift, tremor or rebound. Romberg is negative. Reflexes are 2+ throughout. Babinski: Toes are flexor bilaterally. Fine motor skills and coordination: intact with normal finger taps, normal hand movements, normal rapid alternating patting, normal foot taps and normal foot agility.  Cerebellar testing: No dysmetria or intention  tremor on finger to nose testing. Heel to shin is unremarkable bilaterally. There is no truncal or gait ataxia.  Sensory exam: intact to light touch, pinprick, vibration, temperature sense in the upper and lower extremities.  Gait, station and balance: She stands easily. No veering to one side is noted. No leaning to one side is noted. Posture is age-appropriate and stance is narrow based. Gait shows normal stride length and normal pace. No problems turning are noted. She turns en bloc. Tandem walk is unremarkable. Intact toe and heel stance is noted.               Assessment and Plan:   In summary, Patricia Boyer is a very pleasant 33 y.o.-year old female with recent onset of recurrent HAs and morning HAs as well as recently increasing BP values, who has a history and physical exam somewhat concerning for obstructive sleep apnea (OSA), given her tighter looking airway, her obesity, her FHx of OSA, and her recent onset hypertension and headaches with morning headaches reported, there certainly is enough concern for underlying OSA as the culprit of some of her problems. She also endorses daytime somnolence and not waking up rested.  Her exam is otherwise nonfocal. I had a long chat with the patient about my findings and the diagnosis, its prognosis and treatment options. We talked about medical treatments and non-pharmacological approaches. I explained in particular the risks and ramifications of untreated moderate to severe OSA, especially with respect to developing cardiovascular disease down the Road, including congestive heart failure, difficult to treat hypertension, cardiac arrhythmias, or stroke. Even type 2 diabetes has in part been linked to untreated OSA. We talked about trying to maintain a healthy lifestyle in general, as well as the importance of weight control. I encouraged the patient to eat healthy, exercise daily and keep well hydrated, to keep a scheduled bedtime and wake time routine, to  not skip any meals and eat healthy snacks in between meals.  I recommended the following at this time: sleep study with potential positive airway pressure titration.  I explained the sleep test procedure to the patient and also outlined possible surgical and non-surgical treatment options of OSA, including the use of a custom-made dental device, upper airway surgical options, such as pillar implants, radiofrequency surgery, tongue base surgery, and UPPP. I also explained the CPAP treatment option to the patient, who indicated that she would be willing to try CPAP if the need arises. I explained the importance of being compliant with PAP treatment, not only for insurance purposes but primarily to improve Her symptoms, and for the patient's long term health benefit, including to reduce Her cardiovascular risks. I answered all her questions today and the  patient was in agreement. I would like to see her back after the sleep study is completed and encouraged her to call with any interim questions, concerns, problems or updates.   Thank you very much for allowing me to participate in the care of this nice patient. If I can be of any further assistance to you please do not hesitate to call me at [phone].  Sincerely,   Patricia FoleySaima Becki Mccaskill, MD, PhD

## 2013-04-10 ENCOUNTER — Ambulatory Visit (INDEPENDENT_AMBULATORY_CARE_PROVIDER_SITE_OTHER): Payer: BC Managed Care – PPO

## 2013-04-10 ENCOUNTER — Institutional Professional Consult (permissible substitution): Payer: BC Managed Care – PPO | Admitting: Neurology

## 2013-04-10 DIAGNOSIS — R51 Headache: Secondary | ICD-10-CM

## 2013-04-10 DIAGNOSIS — R400 Somnolence: Secondary | ICD-10-CM

## 2013-04-10 DIAGNOSIS — I10 Essential (primary) hypertension: Secondary | ICD-10-CM

## 2013-04-10 DIAGNOSIS — G479 Sleep disorder, unspecified: Secondary | ICD-10-CM

## 2013-04-10 DIAGNOSIS — E669 Obesity, unspecified: Secondary | ICD-10-CM

## 2013-04-10 DIAGNOSIS — R0609 Other forms of dyspnea: Secondary | ICD-10-CM

## 2013-04-10 DIAGNOSIS — R519 Headache, unspecified: Secondary | ICD-10-CM

## 2013-04-10 DIAGNOSIS — R0989 Other specified symptoms and signs involving the circulatory and respiratory systems: Secondary | ICD-10-CM

## 2013-04-17 ENCOUNTER — Telehealth: Payer: Self-pay | Admitting: Neurology

## 2013-04-17 NOTE — Telephone Encounter (Signed)
Please call and notify the patient that the recent sleep study did not show any significant obstructive sleep apnea. Please inform patient that I would like to go over the details of the study during a follow up appointment and if not already previously scheduled, arrange a followup appointment (please utilize a followu-up slot). Also, route or fax report to PCP and referring MD, if other than PCP.  Once you have spoken to patient, you can close this encounter.   Thanks,  Jesse Nosbisch, MD, PhD [facility] ([facility]  

## 2013-04-20 ENCOUNTER — Encounter: Payer: Self-pay | Admitting: *Deleted

## 2013-04-20 NOTE — Telephone Encounter (Signed)
I called and left a message for the patient about her recent sleep study results. I informed the patient that the study did not show any significant obstructive sleep apnea and that Dr. Frances FurbishAthar would like to discuss in detail her results. I informed the patient to callback to schedule a follow up appointment with Dr. Frances FurbishAthar. I will fax a copy of the report to Dr. Leotis ShamesJeffery Anderson's office and mail the report to the patient.

## 2013-05-04 ENCOUNTER — Encounter: Payer: Self-pay | Admitting: Neurology

## 2013-05-04 ENCOUNTER — Ambulatory Visit (INDEPENDENT_AMBULATORY_CARE_PROVIDER_SITE_OTHER): Payer: BC Managed Care – PPO | Admitting: Neurology

## 2013-05-04 ENCOUNTER — Encounter (INDEPENDENT_AMBULATORY_CARE_PROVIDER_SITE_OTHER): Payer: Self-pay

## 2013-05-04 VITALS — BP 136/95 | HR 90 | Temp 97.8°F | Ht 67.0 in | Wt 257.0 lb

## 2013-05-04 DIAGNOSIS — E669 Obesity, unspecified: Secondary | ICD-10-CM

## 2013-05-04 DIAGNOSIS — R51 Headache: Secondary | ICD-10-CM

## 2013-05-04 DIAGNOSIS — R519 Headache, unspecified: Secondary | ICD-10-CM

## 2013-05-04 NOTE — Progress Notes (Signed)
Subjective:    Patient ID: Patricia Boyer is a 33 y.o. female.  HPI    Interim history:   Patricia Boyer is a very pleasant 33 year old right-handed woman with an underlying medical history of hypertension, reflux disease, arthritis, anemia, cataracts, asthma, LBP, obesity and recurrent headaches, who presents for followup consultation after a recent sleep study. She is unaccompanied today. I first met her on 03/20/2013, at which time she reported recurrent headaches as well as morning headaches and increasing blood pressure values. I felt her physical exam and history were somewhat concerning for underlying obstructive sleep apnea as she had a titer looking airway, obesity and a family history of OSA and reported recent onset hypertension and recurrent morning headaches, daytime somnolence and nonrestorative sleep. Her exam was otherwise nonfocal. I requested that she return for a sleep study. She had a diagnostic sleep study on 04/10/2013 and I went over her test results with her in detail today. Her sleep efficiency was minimally reduced at 89.3% with a latency to sleep of 5.5 minutes and wake after sleep onset of 36 minutes with moderate sleep fragmentation noted. She had a normal arousal index. She had a normal percentage of stage I sleep, an increased percentage of stage II sleep, normal percentage of deep sleep, and a decreased percentage of REM sleep at 11.6% with a prolonged REM latency of 177.5 minutes. She had no significant periodic leg movements or cardiac arrhythmias on EKG. She had very mild intermittent snoring. She slept mostly on her back. Her total AHI was only 0.3 per hour. Baseline oxygen saturation was 97% with a nadir of 93%.   Today, she reports feeling better. She has been trying to walk regularly, for about an hour. Her HAs have been less frequent, her BP values are better and when she walks, she has better sleep that night. She denies any new symptoms or problems. She feels  overall better. She has been trying to lose weight. She is trying to turn off the TV at night. She has reduced soda intake.   Her Past Medical History Is Significant For: Past Medical History  Diagnosis Date  . Arthritis   . Anemia   . Cataract   . GERD (gastroesophageal reflux disease)     Her Past Surgical History Is Significant For: History reviewed. No pertinent past surgical history.  Her Family History Is Significant For: Family History  Problem Relation Age of Onset  . Hypertension Mother     Her Social History Is Significant For: History   Social History  . Marital Status: Single    Spouse Name: N/A    Number of Children: N/A  . Years of Education: N/A   Social History Main Topics  . Smoking status: Never Smoker   . Smokeless tobacco: None  . Alcohol Use: 0.6 oz/week    1 Cans of beer per week  . Drug Use: No  . Sexual Activity: No   Other Topics Concern  . None   Social History Narrative  . None    Her Allergies Are:  No Known Allergies:   Her Current Medications Are:  Outpatient Encounter Prescriptions as of 05/04/2013  Medication Sig  . albuterol (PROVENTIL HFA;VENTOLIN HFA) 108 (90 BASE) MCG/ACT inhaler Inhale 2 puffs into the lungs every 4 (four) hours as needed for wheezing or shortness of breath.  . butalbital-acetaminophen-caffeine (FIORICET, ESGIC) 50-325-40 MG per tablet Take 1-2 tablets by mouth every 6 (six) hours as needed for headache.  . esomeprazole (  NEXIUM) 40 MG capsule Take 1 capsule (40 mg total) by mouth daily.  . fluticasone (FLONASE) 50 MCG/ACT nasal spray Place 2 sprays into the nose daily.  . hydrochlorothiazide (HYDRODIURIL) 25 MG tablet Take 1 tablet (25 mg total) by mouth daily.  . Multiple Vitamins-Minerals (WOMENS MULTI PO) Take by mouth.  . traMADol (ULTRAM) 50 MG tablet Take 2 tablets (100 mg total) by mouth every 8 (eight) hours as needed.  . cyclobenzaprine (FLEXERIL) 10 MG tablet Take 1 tablet (10 mg total) by mouth at  bedtime.  . Norethin Ace-Eth Estrad-FE (JUNEL FE 1/20 PO) Take by mouth.  . [DISCONTINUED] meloxicam (MOBIC) 15 MG tablet Take 1 tablet (15 mg total) by mouth daily.  :  Review of Systems:  Out of a complete 14 point review of systems, all are reviewed and negative with the exception of these symptoms as listed below:  Review of Systems  Constitutional: Positive for appetite change and fatigue.  HENT: Negative.   Eyes: Positive for itching.  Respiratory: Negative.   Cardiovascular: Negative.   Gastrointestinal: Negative.   Endocrine: Negative.   Genitourinary: Negative.   Musculoskeletal: Negative.   Skin: Negative.   Allergic/Immunologic: Negative.   Neurological: Negative.   Hematological: Negative.   Psychiatric/Behavioral: Negative.     Objective:  Neurologic Exam  Physical Exam Physical Examination:   Filed Vitals:   05/04/13 1530  BP: 136/95  Pulse: 90  Temp: 97.8 F (36.6 C)    General Examination: The patient is a very pleasant 33 y.o. female in no acute distress. She appears well-developed and well-nourished and well groomed.   HEENT: Normocephalic, atraumatic, pupils are equal, round and reactive to light and accommodation. Funduscopic exam is normal with sharp disc margins noted. Extraocular tracking is good without limitation to gaze excursion or nystagmus noted. She has a L esotropia. Normal smooth pursuit is noted. Hearing is grossly intact. Face is symmetric with normal facial animation and normal facial sensation. Speech is clear with no dysarthria noted. There is no hypophonia. There is no lip, neck/head, jaw or voice tremor. Neck is supple with full range of passive and active motion. There are no carotid bruits on auscultation. Oropharynx exam reveals: mild mouth dryness, adequate dental hygiene and mild airway crowding, due to larger tongue and tonsils in place. Mallampati is class II. Tongue protrudes centrally and palate elevates symmetrically. Tonsils  are 1+ in size.   Chest: Clear to auscultation without wheezing, rhonchi or crackles noted.  Heart: S1+S2+0, regular and normal without murmurs, rubs or gallops noted.   Abdomen: Soft, non-tender and non-distended with normal bowel sounds appreciated on auscultation.  Extremities: There is no pitting edema in the distal lower extremities bilaterally. Pedal pulses are intact.  Skin: Warm and dry without trophic changes noted. There are no varicose veins.  Musculoskeletal: exam reveals no obvious joint deformities, tenderness or joint swelling or erythema.   Neurologically:  Mental status: The patient is awake, alert and oriented in all 4 spheres. Her immediate and remote memory, attention, language skills and fund of knowledge are appropriate. There is no evidence of aphasia, agnosia, apraxia or anomia. Speech is clear with normal prosody and enunciation. Thought process is linear. Mood is normal and affect is normal.  Cranial nerves II - XII are as described above under HEENT exam. In addition: shoulder shrug is normal with equal shoulder height noted. Motor exam: Normal bulk, strength and tone is noted. There is no drift, tremor or rebound. Romberg is negative. Reflexes are  2+ throughout. Babinski: Toes are flexor bilaterally. Fine motor skills and coordination: intact with normal finger taps, normal hand movements, normal rapid alternating patting, normal foot taps and normal foot agility.  Cerebellar testing: No dysmetria or intention tremor on finger to nose testing. Heel to shin is unremarkable bilaterally. There is no truncal or gait ataxia.  Sensory exam: intact to light touch, pinprick, vibration, temperature sense in the upper and lower extremities.  Gait, station and balance: She stands easily. No veering to one side is noted. No leaning to one side is noted. Posture is age-appropriate and stance is narrow based. Gait shows normal stride length and normal pace. No problems turning are  noted. She turns en bloc. Tandem walk is unremarkable. Intact toe and heel stance is noted.               Assessment and Plan:   In summary, Patricia Boyer is a very pleasant 33 year old female with recent onset of recurrent HAs and morning HAs as well as recently increasing BP values, who had a recent sleep study, which failed to show significant obstructive sleep apnea (OSA) and therefore no significant explanation for recurrent headaches or blood pressure values. In the interim, she has improved. She reports less frequent headaches and has not been using the tramadol as much. In fact she has a prescription still from January when she filled it. She has been working on her weight loss and feels that her sleep has improved with walking regularly. Her exam continues to be benign. I explained her sleep study findings to her and given her benign exam and benign sleep study findings and improved headaches and improved sleep I suggested that I see her back as needed. She was in agreement.

## 2013-05-04 NOTE — Patient Instructions (Addendum)
Please remember to try to maintain good sleep hygiene, which means: Keep a regular sleep and wake schedule, try not to exercise or have a meal within 2 hours of your bedtime, try to keep your bedroom conducive for sleep, that is, cool and dark, without light distractors such as an illuminated alarm clock, and refrain from watching TV right before sleep or in the middle of the night and do not keep the TV or radio on during the night. Also, try not to use or play on electronic devices at bedtime, such as your cell phone, tablet PC or laptop. If you like to read at bedtime on an electronic device, try to dim the background light as much as possible. Do not eat in the middle of the night.   Keep going with your regular walks, which seems to help your sleep and your headaches. Try to continue to lose weight. I can see you back as needed.

## 2014-06-16 ENCOUNTER — Ambulatory Visit (INDEPENDENT_AMBULATORY_CARE_PROVIDER_SITE_OTHER): Payer: BC Managed Care – PPO | Admitting: Physician Assistant

## 2014-06-16 VITALS — BP 142/84 | HR 80 | Temp 98.3°F | Resp 16 | Ht 67.25 in | Wt 265.4 lb

## 2014-06-16 DIAGNOSIS — R0689 Other abnormalities of breathing: Secondary | ICD-10-CM

## 2014-06-16 DIAGNOSIS — R070 Pain in throat: Secondary | ICD-10-CM | POA: Diagnosis not present

## 2014-06-16 DIAGNOSIS — R0600 Dyspnea, unspecified: Secondary | ICD-10-CM | POA: Diagnosis not present

## 2014-06-16 DIAGNOSIS — R0989 Other specified symptoms and signs involving the circulatory and respiratory systems: Secondary | ICD-10-CM

## 2014-06-16 DIAGNOSIS — R0981 Nasal congestion: Secondary | ICD-10-CM

## 2014-06-16 DIAGNOSIS — K219 Gastro-esophageal reflux disease without esophagitis: Secondary | ICD-10-CM | POA: Diagnosis not present

## 2014-06-16 DIAGNOSIS — R0609 Other forms of dyspnea: Secondary | ICD-10-CM

## 2014-06-16 LAB — POCT RAPID STREP A (OFFICE): Rapid Strep A Screen: NEGATIVE

## 2014-06-16 MED ORDER — IPRATROPIUM BROMIDE 0.03 % NA SOLN: 2.0000 | Freq: Two times a day (BID) | NASAL | Status: DC

## 2014-06-16 MED ORDER — OMEPRAZOLE 20 MG PO CPDR
20.0000 mg | DELAYED_RELEASE_CAPSULE | Freq: Every day | ORAL | Status: DC
Start: 2014-06-16 — End: 2018-05-15

## 2014-06-16 MED ORDER — GUAIFENESIN ER 1200 MG PO TB12: 1.0000 | ORAL_TABLET | Freq: Two times a day (BID) | ORAL | Status: DC | PRN

## 2014-06-16 MED ORDER — CETIRIZINE HCL 10 MG PO TABS: 10.0000 mg | ORAL_TABLET | Freq: Every day | ORAL | Status: DC

## 2014-06-16 NOTE — Patient Instructions (Addendum)
We will treat this supportively with mucinex, and ipratropium.  Please drink water with this medication.  This is either an upper respiratory infection of viral etiology.

## 2014-06-16 NOTE — Progress Notes (Signed)
[facility] [address], [place] [id number] [phone]  Date:  06/16/2014   Name:  Patricia Boyer   DOB:  Jun 30, 1980   MRN:  [id number]  PCP:  No PCP Per Patient    Chief Complaint: Sore Throat; Neck Pain; Generalized Body Aches; Cough; and Nasal Congestion   History of Present Illness:  Patricia Stallsakita R Obremski is a 34 y.o. very pleasant female patient who presents with the following:  2 weeks ago had symptoms of sore throat, body aches, and runny-nose, and sneezing which lasted 2 days.   5 days ago, the sore throat and nasal congestion and cough, that has progressively worsened.  Ears are itching.  She has neck pain.  She has no fever or chills.  She coughs up phlegm in the morning that is now green.  Dry coughing spells.  She has no dyspnea and sob.  She has no allergies.  She does not complains.  Alka seltzer, thera flu aleve cold and sinus.  She has no known sick contacts but works at a [employer].  Her generalized body aches are while she is at work.  The temperature in the classroom is extremely cold.  She reports that she feels stiff and achy while she is in the classroom, but when she is not in the cold room, the symptoms resolve.  She reports similar symptoms that occurred about 2 weeks ago which had runny nose, sneezing, and sore throat, that lasted for about 2 days.    Patient Active Problem List   Diagnosis Date Noted  . BMI 38.0-38.9,adult 05/17/2012    Past Medical History  Diagnosis Date  . Arthritis   . Anemia   . Cataract   . GERD (gastroesophageal reflux disease)     History reviewed. No pertinent past surgical history.  History  Substance Use Topics  . Smoking status: Never Smoker   . Smokeless tobacco: Not on file  . Alcohol Use: 0.6 oz/week    1 Cans of beer per week    Family History  Problem Relation Age of Onset  . Hypertension Mother     No Known Allergies  Medication list has been reviewed and updated.  Current  Outpatient Prescriptions on File Prior to Visit  Medication Sig Dispense Refill  . fluticasone (FLONASE) 50 MCG/ACT nasal spray Place 2 sprays into the nose daily. 16 g 6  . Multiple Vitamins-Minerals (WOMENS MULTI PO) Take by mouth.    Marland Kitchen. albuterol (PROVENTIL HFA;VENTOLIN HFA) 108 (90 BASE) MCG/ACT inhaler Inhale 2 puffs into the lungs every 4 (four) hours as needed for wheezing or shortness of breath. (Patient not taking: Reported on 06/16/2014) 1 Inhaler 0  . butalbital-acetaminophen-caffeine (FIORICET, ESGIC) 50-325-40 MG per tablet Take 1-2 tablets by mouth every 6 (six) hours as needed for headache. (Patient not taking: Reported on 06/16/2014) 40 tablet 0  . cyclobenzaprine (FLEXERIL) 10 MG tablet Take 1 tablet (10 mg total) by mouth at bedtime. (Patient not taking: Reported on 06/16/2014) 30 tablet 0  . esomeprazole (NEXIUM) 40 MG capsule Take 1 capsule (40 mg total) by mouth daily. (Patient not taking: Reported on 06/16/2014) 30 capsule 3  . hydrochlorothiazide (HYDRODIURIL) 25 MG tablet Take 1 tablet (25 mg total) by mouth daily. (Patient not taking: Reported on 06/16/2014) 90 tablet 3  . Norethin Ace-Eth Estrad-FE (JUNEL FE 1/20 PO) Take by mouth.    . traMADol (ULTRAM) 50 MG tablet Take 2 tablets (100 mg total) by mouth every 8 (  eight) hours as needed. (Patient not taking: Reported on 06/16/2014) 30 tablet 0   No current facility-administered medications on file prior to visit.    Review of Systems: ROS otherwise unremarkable unless listed above.   Physical Examination: Filed Vitals:   06/16/14 1810  BP: 142/84  Pulse: 80  Temp: 98.3 F (36.8 C)  Resp: 16   Filed Vitals:   06/16/14 1810  Height: 5' 7.25" (1.708 m)  Weight: 265 lb 6.4 oz (120.385 kg)   Body mass index is 41.27 kg/(m^2). Ideal Body Weight: Weight in (lb) to have BMI = 25: 160.5 Physical Exam  Constitutional: She is oriented to person, place, and time. She appears well-developed and well-nourished.  HENT:  Head:  Normocephalic and atraumatic.  Right Ear: Tympanic membrane, external ear and ear canal normal.  Left Ear: Tympanic membrane, external ear and ear canal normal.  Nose: Mucosal edema (Slightly pale and mildly cyanotic) and rhinorrhea present. Right sinus exhibits no maxillary sinus tenderness and no frontal sinus tenderness. Left sinus exhibits no maxillary sinus tenderness and no frontal sinus tenderness.  Mouth/Throat: No uvula swelling. No oropharyngeal exudate, posterior oropharyngeal edema or posterior oropharyngeal erythema.  Crease over bridge of nose.  Eyes: Conjunctivae and EOM are normal. Pupils are equal, round, and reactive to light.  No cobblestoning or inflammation of inner eyelid.  Cardiovascular: Normal rate, regular rhythm, normal heart sounds and intact distal pulses.   Pulmonary/Chest: Effort normal and breath sounds normal. No respiratory distress. She has no wheezes.  Neurological: She is alert and oriented to person, place, and time.  Skin: Skin is warm and dry. No erythema. No pallor.  Psychiatric: She has a normal mood and affect. Her behavior is normal.   Results for orders placed or performed in visit on 06/16/14  POCT rapid strep A  Result Value Ref Range   Rapid Strep A Screen Negative Negative     Assessment and Plan: 34 year old female is here today for a chief complaint of throat pain, nasal congestion, cough, and "body aches".  The body aches are secondary to her environment.  I believe that these are allergies.  Starting ipratropium, mucinex, and zyrtec.  Advised to contact if symptoms continue, for possible abx treatment.  This is unlikely a double sickening based upon time lapse.  -Culture placed, will contact for abx if positive growth   Throat pain in adult - Plan: POCT rapid strep A, Culture, Group A Strep, omeprazole (PRILOSEC) 20 MG capsule  Symptoms of upper respiratory infection (URI) - Plan: Guaifenesin (MUCINEX MAXIMUM STRENGTH) 1200 MG TB12,  cetirizine (ZYRTEC) 10 MG tablet  Gastroesophageal reflux disease, esophagitis presence not specified - Plan: omeprazole (PRILOSEC) 20 MG capsule  Nasal congestion - Plan: ipratropium (ATROVENT) 0.03 % nasal spray  Trena PlattStephanie Oletha Tolson, PA-C [facility] 5/4/201611:00 PM

## 2014-06-19 LAB — CULTURE, GROUP A STREP: ORGANISM ID, BACTERIA: NORMAL

## 2014-06-23 ENCOUNTER — Telehealth: Payer: Self-pay

## 2014-06-23 NOTE — Telephone Encounter (Signed)
Assessment and Plan: 34 year old female is here today for a chief complaint of throat pain, nasal congestion, cough, and "body aches". The body aches are secondary to her environment. I believe that these are allergies. Starting ipratropium, mucinex, and zyrtec. Advised to contact if symptoms continue, for possible abx treatment. This is unlikely a double sickening based upon time lapse.  -Culture placed, will contact for abx if positive growth   Throat pain in adult - Plan: POCT rapid strep A, Culture, Group A Strep, omeprazole (PRILOSEC) 20 MG capsule  Symptoms of upper respiratory infection (URI) - Plan: Guaifenesin (MUCINEX MAXIMUM STRENGTH) 1200 MG TB12, cetirizine (ZYRTEC) 10 MG tablet  Gastroesophageal reflux disease, esophagitis presence not specified - Plan: omeprazole (PRILOSEC) 20 MG capsule  Nasal congestion - Plan: ipratropium (ATROVENT) 0.03 % nasal spray  Trena PlattStephanie English, PA-C [facility] 5/4/201611:00 PM

## 2014-06-23 NOTE — Telephone Encounter (Signed)
Pt says she is not feeling better and was told to call back if symptoms did not improve. Please call her at [phone] ext [phone]

## 2014-06-25 ENCOUNTER — Other Ambulatory Visit: Payer: Self-pay | Admitting: Physician Assistant

## 2014-06-25 DIAGNOSIS — J069 Acute upper respiratory infection, unspecified: Secondary | ICD-10-CM

## 2014-06-25 DIAGNOSIS — R05 Cough: Secondary | ICD-10-CM

## 2014-06-25 DIAGNOSIS — R059 Cough, unspecified: Secondary | ICD-10-CM

## 2014-06-25 MED ORDER — BENZONATATE 100 MG PO CAPS: 100.0000 mg | ORAL_CAPSULE | Freq: Three times a day (TID) | ORAL | Status: DC | PRN

## 2014-06-25 MED ORDER — AMOXICILLIN-POT CLAVULANATE 875-125 MG PO TABS
1.0000 | ORAL_TABLET | Freq: Two times a day (BID) | ORAL | Status: AC
Start: 2014-06-25 — End: 2014-07-05

## 2014-06-25 MED ORDER — HYDROCOD POLST-CPM POLST ER 10-8 MG/5ML PO SUER: 5.0000 mL | Freq: Two times a day (BID) | ORAL | Status: DC | PRN

## 2014-06-25 NOTE — Telephone Encounter (Signed)
Contacted extension and mobile.  Left message with patient, that I will contact in 30 minutes to an hour to discuss symptoms.

## 2014-06-25 NOTE — Addendum Note (Signed)
Addended by: Morrell RiddleWEBER, SARAH L on: 06/25/2014 02:04 PM   Modules accepted: Orders

## 2014-06-25 NOTE — Progress Notes (Signed)
Contacted patient: States she is still not feeling well.  The nasal congestion is improving, though she does have some sinus pressure.  Ear symptoms have resolved.  She states that she is coughing and sorethroat continues.  Sputum remains greenish.  She has no fever, chills, or fatigue.  She has no dyspnea or sob.  She is taking the augmentin and  I am issuing augmentin to cover for both upper and lower respiratory.   -Ordered augmentin bid 10 days -Supportive treatment for cough, both tussionex and tessalon pearls. -rtc if sxs worsen. -Advised patient to rtc to pick up tussionex

## 2014-07-03 ENCOUNTER — Telehealth: Payer: Self-pay

## 2014-07-03 NOTE — Telephone Encounter (Signed)
Pt is needing to talk with someone about possible side effects

## 2014-07-05 NOTE — Telephone Encounter (Signed)
lmom to cb. 

## 2014-07-06 NOTE — Telephone Encounter (Signed)
Left message for pt to call back  °

## 2014-10-22 ENCOUNTER — Other Ambulatory Visit: Payer: Self-pay | Admitting: Physician Assistant

## 2017-07-18 ENCOUNTER — Encounter (INDEPENDENT_AMBULATORY_CARE_PROVIDER_SITE_OTHER): Payer: BC Managed Care – PPO

## 2017-07-29 ENCOUNTER — Encounter (INDEPENDENT_AMBULATORY_CARE_PROVIDER_SITE_OTHER): Payer: Self-pay

## 2017-07-29 ENCOUNTER — Ambulatory Visit (INDEPENDENT_AMBULATORY_CARE_PROVIDER_SITE_OTHER): Payer: BC Managed Care – PPO | Admitting: Family Medicine

## 2017-08-01 ENCOUNTER — Ambulatory Visit (INDEPENDENT_AMBULATORY_CARE_PROVIDER_SITE_OTHER): Payer: BC Managed Care – PPO | Admitting: Family Medicine

## 2017-09-04 ENCOUNTER — Encounter: Payer: Self-pay | Admitting: Registered"

## 2017-09-04 ENCOUNTER — Encounter: Payer: BC Managed Care – PPO | Attending: Internal Medicine | Admitting: Registered"

## 2017-09-04 DIAGNOSIS — Z713 Dietary counseling and surveillance: Secondary | ICD-10-CM | POA: Diagnosis present

## 2017-09-04 DIAGNOSIS — E669 Obesity, unspecified: Secondary | ICD-10-CM

## 2017-09-04 NOTE — Progress Notes (Signed)
Medical Nutrition Therapy:  Appt start time: 11:10 end time:  12:15.   Assessment:  Primary concerns today: wants to know the right things to eat for weight loss.   Pt expectations: what to buy, what to eat, snack options, and portions  Pt states she wants to eat healthy. Pt states she does not want to restrict too much. Pt states she wants to lose weight to improve energy and to be smaller. Pt reports history of dieting (weight watchers and weight loss pills).   Pt states she her workload increased in July causing her be more depressed. Pt begins to cry during appointment stating that she cries easily now. Pt states she balances school full-time (pursuing PhD) and work full-time as Psychologist, forensichigh school teacher.  Pt states her job is stressful and currently looking for new employment.   Pt states she has been averaging about 4 hours of sleep and night during the summer. Pt states she sleeps about 6 hours/night during school year.   Pt states she sees school as a Optometristpersonal accomplishment and ready to begin her career.   Pt she avoids bread. Pt states she does not drink sodas but that is the only option at her job. Pt states her parents are currently visiting which is helping her to be more organized with eating, having more than 1 meal a day. Pt states she has been eating more fruit as well.    Preferred Learning Style:   No preference indicated   Learning Readiness:   Ready  Change in progress   MEDICATIONS: See list   DIETARY INTAKE:  Usual eating pattern includes 1-3 meals and 1-2 snacks per day.  Everyday foods include granola bars, protein bars, chips, popcorn, cheez-its, and fast food.  Avoided foods include bread.    24-hr recall:  B ( AM): coffee (with cream), cheez-its  Snk ( AM): 2-3 granola bar or protein bar  L ( PM): typically skips; cheez-its or granola bar or fast food-chicken tenders, fries Snk ( PM): sometimes popcorn D ( PM): sometimes skips; salad or meat, rice,  and vegetables Snk ( PM): grapes, crackers Beverages: coffee, water, juice, sweet tea (sometimes)  Usual physical activity: was walking 60 min, 3x/week for 2 months  Estimated energy needs: 1800 calories 200 g carbohydrates 135 g protein 50 g fat  Progress Towards Goal(s):  In progress.   Nutritional Diagnosis:  NB-1.1 Food and nutrition-related knowledge deficit As related to lack of prior nutrition-related education.  As evidenced by pt reports inaccurate and incomplete information.    Intervention:  Nutrition education and counseling. Pt was educated and counseled on the stress management, the importance of a mental health professional, meal planning, My Plate, and eating more than 1 meal a day. Pt was in agreement with goals listed.  Goals: - Contact Mental Health professional to help with life transitions. - Snack options include: fruit and nuts, greek yogurt, vegetables with peanut butter, etc.  - Add in breakfast option of carbohydrates and protein such as oatmeal and eggs. - Aim to eat 3 meals a day.  - Meal plan and grocery shop on weekends for upcoming week.   Teaching Method Utilized:  Visual Auditory Hands on  Handouts given during visit include:  MyPlate   Barriers to learning/adherence to lifestyle change: work-life-school balance. Pt is stressed and overwhelmed with responsibilities  Demonstrated degree of understanding via:  Teach Back   Monitoring/Evaluation:  Dietary intake, exercise, and body weight prn. Pt states she will call  if she feels the needs for a follow-up appointment.

## 2017-09-04 NOTE — Patient Instructions (Addendum)
-   Contact Mental Health professional to help with life transitions.  - Snack options include: fruit and nuts, greek yogurt, vegetables with peanut butter, etc.   - Add in breakfast option of carbohydrates and protein such as oatmeal and eggs.  - Aim to eat 3 meals a day.   - Meal plan and grocery shop on weekends for upcoming week.

## 2018-04-24 ENCOUNTER — Encounter: Payer: BC Managed Care – PPO | Admitting: Nurse Practitioner

## 2018-05-15 ENCOUNTER — Other Ambulatory Visit: Payer: Self-pay

## 2018-05-15 ENCOUNTER — Ambulatory Visit: Payer: BC Managed Care – PPO | Admitting: Nurse Practitioner

## 2018-05-15 ENCOUNTER — Other Ambulatory Visit (HOSPITAL_COMMUNITY)
Admission: RE | Admit: 2018-05-15 | Discharge: 2018-05-15 | Disposition: A | Discharge status: Home / Self Care | Payer: BC Managed Care – PPO | Source: Ambulatory Visit | Attending: Nurse Practitioner | Admitting: Nurse Practitioner

## 2018-05-15 ENCOUNTER — Encounter: Payer: Self-pay | Admitting: Nurse Practitioner

## 2018-05-15 VITALS — BP 140/78 | HR 78 | Temp 98.2°F | Ht 67.8 in | Wt 303.4 lb

## 2018-05-15 DIAGNOSIS — Z113 Encounter for screening for infections with a predominantly sexual mode of transmission: Secondary | ICD-10-CM | POA: Diagnosis present

## 2018-05-15 DIAGNOSIS — Z124 Encounter for screening for malignant neoplasm of cervix: Secondary | ICD-10-CM

## 2018-05-15 DIAGNOSIS — Z6841 Body Mass Index (BMI) 40.0 and over, adult: Secondary | ICD-10-CM

## 2018-05-15 DIAGNOSIS — Z0000 Encounter for general adult medical examination without abnormal findings: Secondary | ICD-10-CM

## 2018-05-15 DIAGNOSIS — F320 Major depressive disorder, single episode, mild: Secondary | ICD-10-CM

## 2018-05-15 DIAGNOSIS — F32A Depression, unspecified: Secondary | ICD-10-CM

## 2018-05-15 DIAGNOSIS — Z23 Encounter for immunization: Secondary | ICD-10-CM | POA: Diagnosis not present

## 2018-05-15 LAB — POCT URINALYSIS DIPSTICK
Bilirubin, UA: NEGATIVE
Blood, UA: NEGATIVE
Glucose, UA: NEGATIVE
Ketones, UA: NEGATIVE
Leukocytes, UA: NEGATIVE
Nitrite, UA: NEGATIVE
Protein, UA: NEGATIVE
Spec Grav, UA: 1.015 (ref 1.010–1.025)
Urobilinogen, UA: 0.2 E.U./dL
pH, UA: 6 (ref 5.0–8.0)

## 2018-05-15 MED ORDER — LIRAGLUTIDE -WEIGHT MANAGEMENT 18 MG/3ML ~~LOC~~ SOPN: 3.0000 mg | PEN_INJECTOR | Freq: Every day | SUBCUTANEOUS | 1 refills | Status: DC

## 2018-05-15 MED ORDER — TETANUS-DIPHTH-ACELL PERTUSSIS 5-2.5-18.5 LF-MCG/0.5 IM SUSP
0.5000 mL | Freq: Once | INTRAMUSCULAR | Status: AC
  Administered 2018-05-15: 0.5 mL via INTRAMUSCULAR

## 2018-05-15 NOTE — Progress Notes (Signed)
Subjective:     Patient ID: Patricia Boyer , female    DOB: [birth year] , 38 y.o.   MRN: [id number]   Chief Complaint  Patient presents with  . Annual Exam   The patient states she uses none for birth control. Last LMP was No LMP recorded.. Negative for Dysmenorrhea and Negative for Menorrhagia Mammogram never done age is 38 y/o.  Negative for: breast discharge, breast lump(s), breast pain and breast self exam.  Pertinent negatives include abnormal bleeding (hematology), anxiety, decreased libido, depression, difficulty falling sleep, dyspareunia, history of infertility, nocturia, sexual dysfunction, sleep disturbances, urinary incontinence, urinary urgency, vaginal discharge and vaginal itching. Diet regular in the last 2 weeks. The patient states her exercise level is  minimal.      The patient's tobacco use is:  Social History   Tobacco Use  Smoking Status Never Smoker  Smokeless Tobacco Never Used   She has been exposed to passive smoke. The patient's alcohol use is:  Social History   Substance and Sexual Activity  Alcohol Use Yes  . Alcohol/week: 1.0 standard drinks  . Types: 1 Cans of beer per week   Additional information: Last pap was more than 3 years, needs a PAP now.    HPI  Here for HM    Past Medical History:  Diagnosis Date  . Anemia   . Arthritis   . Cataract   . GERD (gastroesophageal reflux disease)      Family History  Problem Relation Age of Onset  . Hypertension Mother      Current Outpatient Medications:  .  albuterol (PROVENTIL HFA;VENTOLIN HFA) 108 (90 BASE) MCG/ACT inhaler, Inhale 2 puffs into the lungs every 4 (four) hours as needed for wheezing or shortness of breath. (Patient not taking: Reported on 06/16/2014), Disp: 1 Inhaler, Rfl: 0   No Known Allergies   Review of Systems  Constitutional: Negative.   HENT: Negative.   Eyes: Negative.   Respiratory: Negative.  Negative for cough.   Cardiovascular: Negative.   Gastrointestinal:  Negative.   Endocrine: Negative.   Genitourinary: Negative.   Musculoskeletal: Negative.   Skin: Negative.   Allergic/Immunologic: Negative.   Neurological: Negative.  Negative for dizziness and headaches.  Hematological: Negative.   Psychiatric/Behavioral: Negative.      Today's Vitals   05/15/18 1511  BP: 140/78  Pulse: 78  Temp: 98.2 F (36.8 C)  TempSrc: Oral  SpO2: 97%  Weight: (!) 303 lb 6.4 oz (137.6 kg)  Height: 5' 7.8" (1.722 m)   Body mass index is 46.4 kg/m.   Objective:  Physical Exam Constitutional:      General: She is not in acute distress.    Appearance: Normal appearance. She is well-developed. She is obese.  HENT:     Head: Normocephalic and atraumatic.     Right Ear: Hearing, tympanic membrane, ear canal and external ear normal.     Left Ear: Hearing, tympanic membrane, ear canal and external ear normal.     Nose: Nose normal.     Mouth/Throat:     Mouth: Mucous membranes are moist.     Pharynx: Oropharynx is clear.  Eyes:     General: Lids are normal.     Conjunctiva/sclera: Conjunctivae normal.     Pupils: Pupils are equal, round, and reactive to light.     Funduscopic exam:    Right eye: No papilledema.        Left eye: No papilledema.  Neck:  Musculoskeletal: Full passive range of motion without pain, normal range of motion and neck supple.     Thyroid: No thyroid mass.     Vascular: No carotid bruit.  Cardiovascular:     Rate and Rhythm: Normal rate and regular rhythm.     Pulses: Normal pulses.     Heart sounds: Normal heart sounds. No murmur.  Pulmonary:     Effort: Pulmonary effort is normal.     Breath sounds: Normal breath sounds.  Chest:     Chest wall: No mass.     Breasts:        Right: Normal. No mass or tenderness.        Left: Normal. No mass or tenderness.  Abdominal:     General: Abdomen is flat. Bowel sounds are normal.     Palpations: Abdomen is soft.  Genitourinary:    Vagina: Vaginal discharge (white  discharge no odor) present.     Uterus: Normal.      Adnexa: Right adnexa normal and left adnexa normal.       Right: No mass or tenderness.         Left: No mass or tenderness.       Comments: Unable to visualize cervix likely due to body habitus Musculoskeletal: Normal range of motion.        General: No swelling.     Right lower leg: No edema.     Left lower leg: No edema.  Skin:    General: Skin is warm and dry.     Capillary Refill: Capillary refill takes less than 2 seconds.  Neurological:     General: No focal deficit present.     Mental Status: She is alert and oriented to person, place, and time.     Cranial Nerves: No cranial nerve deficit.     Sensory: No sensory deficit.  Psychiatric:        Mood and Affect: Mood normal.        Behavior: Behavior normal.        Thought Content: Thought content normal.        Judgment: Judgment normal.         Assessment And Plan:     1. BMI 45.0-49.9, adult [facility])  Will restart her on Saxenda reports she did well  Discussed importance of increasing her physical activity. - Ambulatory referral to Psychology - TSH - Lipid Profile - Liraglutide -Weight Management (SAXENDA) 18 MG/3ML SOPN; Inject 3 mg into the skin daily.  Dispense: 5 pen; Refill: 1  2. Mild depression ([place])  Depression screen is 10, she is willing to have counseling a referral has been placed - Ambulatory referral to Psychology  3. Health maintenance examination . Behavior modifications discussed and diet history reviewed.   . Pt will continue to exercise regularly and modify diet with low GI, plant based foods and decrease intake of processed foods.  . Recommend intake of daily multivitamin, Vitamin D, and calcium.  . Recommend for preventive screenings, as well as recommend immunizations that include TDAP (done today) . Manual breast exam done without abnormal findings. - CBC no Diff - BMP8+eGFR - Liraglutide -Weight Management (SAXENDA) 18 MG/3ML SOPN;  Inject 3 mg into the skin daily.  Dispense: 5 pen; Refill: 1  4. Encounter for Papanicolaou smear of cervix  White discharge present in vaginal vault otherwise no abnormal findings  5. Screen for STD (sexually transmitted disease)   6. Encounter for immunization  Will give tetanus vaccine  today while in office. Refer to order management. TDAP will be administered to adults 50-40 years old every 10 years. - Tdap (BOOSTRIX) injection 0.5 mL   Minette Brine, FNP    THE PATIENT IS ENCOURAGED TO PRACTICE SOCIAL DISTANCING DUE TO THE COVID-19 PANDEMIC.

## 2018-05-16 LAB — LIPID PANEL
Chol/HDL Ratio: 2.5 ratio (ref 0.0–4.4)
Cholesterol, Total: 180 mg/dL (ref 100–199)
HDL: 71 mg/dL (ref >39)
LDL Calculated: 96 mg/dL (ref 0–99)
Triglycerides: 63 mg/dL (ref 0–149)
VLDL Cholesterol Cal: 13 mg/dL (ref 5–40)

## 2018-05-16 LAB — CBC
Hematocrit: 39.4 % (ref 34.0–46.6)
Hemoglobin: 13.3 g/dL (ref 11.1–15.9)
MCH: 27.5 pg (ref 26.6–33.0)
MCHC: 33.8 g/dL (ref 31.5–35.7)
MCV: 82 fL (ref 79–97)
Platelets: 331 10*3/uL (ref 150–450)
RBC: 4.83 x10E6/uL (ref 3.77–5.28)
RDW: 14.6 % (ref 11.7–15.4)
WBC: 8.2 10*3/uL (ref 3.4–10.8)

## 2018-05-16 LAB — BMP8+EGFR
BUN/Creatinine Ratio: 12 (ref 9–23)
BUN: 9 mg/dL (ref 6–20)
CO2: 22 mmol/L (ref 20–29)
Calcium: 9.3 mg/dL (ref 8.7–10.2)
Chloride: 105 mmol/L (ref 96–106)
Creatinine, Ser: 0.77 mg/dL (ref 0.57–1.00)
GFR calc Af Amer: 115 mL/min/{1.73_m2} (ref >59)
GFR calc non Af Amer: 100 mL/min/{1.73_m2} (ref >59)
Glucose: 68 mg/dL (ref 65–99)
Potassium: 4.1 mmol/L (ref 3.5–5.2)
Sodium: 139 mmol/L (ref 134–144)

## 2018-05-16 LAB — TSH: TSH: 5.62 u[IU]/mL — ABNORMAL HIGH (ref 0.450–4.500)

## 2018-05-20 ENCOUNTER — Telehealth: Payer: Self-pay | Admitting: Nurse Practitioner

## 2018-05-20 LAB — CERVICOVAGINAL ANCILLARY ONLY
Bacterial vaginitis: POSITIVE — AB
Candida vaginitis: POSITIVE — AB
Chlamydia: NEGATIVE
Neisseria Gonorrhea: NEGATIVE
Trichomonas: POSITIVE — AB

## 2018-05-20 LAB — CYTOLOGY - PAP: Diagnosis: NEGATIVE

## 2018-05-20 NOTE — Telephone Encounter (Signed)
PA STARTED KEY #[id number]

## 2018-05-22 ENCOUNTER — Other Ambulatory Visit: Payer: Self-pay | Admitting: Nurse Practitioner

## 2018-05-22 DIAGNOSIS — B3731 Acute candidiasis of vulva and vagina: Secondary | ICD-10-CM

## 2018-05-22 DIAGNOSIS — B9689 Other specified bacterial agents as the cause of diseases classified elsewhere: Secondary | ICD-10-CM

## 2018-05-22 DIAGNOSIS — A599 Trichomoniasis, unspecified: Secondary | ICD-10-CM

## 2018-05-22 DIAGNOSIS — N760 Acute vaginitis: Secondary | ICD-10-CM

## 2018-05-22 DIAGNOSIS — B373 Candidiasis of vulva and vagina: Secondary | ICD-10-CM

## 2018-05-22 MED ORDER — FLUCONAZOLE 150 MG PO TABS: 150.0000 mg | ORAL_TABLET | Freq: Every day | ORAL | 0 refills | Status: DC

## 2018-05-22 MED ORDER — METRONIDAZOLE 500 MG PO TABS: 500.0000 mg | ORAL_TABLET | Freq: Three times a day (TID) | ORAL | 0 refills | Status: AC

## 2018-05-26 ENCOUNTER — Other Ambulatory Visit: Payer: Self-pay

## 2018-05-26 ENCOUNTER — Telehealth: Payer: Self-pay

## 2018-05-26 DIAGNOSIS — Z6841 Body Mass Index (BMI) 40.0 and over, adult: Secondary | ICD-10-CM

## 2018-05-26 MED ORDER — INSULIN PEN NEEDLE 30G X 8 MM MISC: 4.0000 | Status: DC | PRN

## 2018-05-26 NOTE — Telephone Encounter (Signed)
Pt notified of prior auth approval

## 2018-06-09 ENCOUNTER — Other Ambulatory Visit: Payer: Self-pay

## 2018-06-09 DIAGNOSIS — Z6838 Body mass index (BMI) 38.0-38.9, adult: Secondary | ICD-10-CM

## 2018-06-09 MED ORDER — INSULIN PEN NEEDLE 30G X 8 MM MISC: 4.0000 | Status: DC | PRN

## 2018-06-11 ENCOUNTER — Other Ambulatory Visit: Payer: Self-pay

## 2018-06-11 ENCOUNTER — Telehealth: Payer: Self-pay

## 2018-06-11 ENCOUNTER — Telehealth: Payer: Self-pay | Admitting: Nurse Practitioner

## 2018-06-11 NOTE — Telephone Encounter (Signed)
Pharmacy called asking if it is ok to switch the novofine needles to the BD needles because her ins does not cover the novofine needles. YRL,RMA

## 2018-06-11 NOTE — Telephone Encounter (Signed)
PT CALLED STATED THAT THE PEN NEEDLES WERE NOT AT PHARMACY PLS RESEND

## 2018-06-19 ENCOUNTER — Ambulatory Visit (INDEPENDENT_AMBULATORY_CARE_PROVIDER_SITE_OTHER): Payer: BC Managed Care – PPO | Admitting: Psychology

## 2018-06-19 DIAGNOSIS — F3289 Other specified depressive episodes: Secondary | ICD-10-CM

## 2018-06-19 DIAGNOSIS — F418 Other specified anxiety disorders: Secondary | ICD-10-CM | POA: Diagnosis not present

## 2018-06-27 ENCOUNTER — Ambulatory Visit (INDEPENDENT_AMBULATORY_CARE_PROVIDER_SITE_OTHER): Payer: BC Managed Care – PPO | Admitting: Psychology

## 2018-06-27 DIAGNOSIS — F3289 Other specified depressive episodes: Secondary | ICD-10-CM | POA: Diagnosis not present

## 2018-06-27 DIAGNOSIS — F418 Other specified anxiety disorders: Secondary | ICD-10-CM | POA: Diagnosis not present

## 2018-07-02 ENCOUNTER — Ambulatory Visit (INDEPENDENT_AMBULATORY_CARE_PROVIDER_SITE_OTHER): Payer: BC Managed Care – PPO | Admitting: Psychology

## 2018-07-02 DIAGNOSIS — F3289 Other specified depressive episodes: Secondary | ICD-10-CM | POA: Diagnosis not present

## 2018-07-11 ENCOUNTER — Ambulatory Visit (INDEPENDENT_AMBULATORY_CARE_PROVIDER_SITE_OTHER): Payer: BC Managed Care – PPO | Admitting: Psychology

## 2018-07-11 ENCOUNTER — Other Ambulatory Visit: Payer: Self-pay | Admitting: Nurse Practitioner

## 2018-07-11 DIAGNOSIS — F3289 Other specified depressive episodes: Secondary | ICD-10-CM | POA: Diagnosis not present

## 2018-07-11 DIAGNOSIS — F418 Other specified anxiety disorders: Secondary | ICD-10-CM

## 2018-07-11 DIAGNOSIS — Z0000 Encounter for general adult medical examination without abnormal findings: Secondary | ICD-10-CM

## 2018-07-11 DIAGNOSIS — Z6841 Body Mass Index (BMI) 40.0 and over, adult: Secondary | ICD-10-CM

## 2018-07-21 ENCOUNTER — Ambulatory Visit (INDEPENDENT_AMBULATORY_CARE_PROVIDER_SITE_OTHER): Payer: BC Managed Care – PPO | Admitting: Psychology

## 2018-07-21 DIAGNOSIS — F418 Other specified anxiety disorders: Secondary | ICD-10-CM | POA: Diagnosis not present

## 2018-08-04 ENCOUNTER — Telehealth: Payer: Self-pay

## 2018-08-04 ENCOUNTER — Other Ambulatory Visit: Payer: Self-pay | Admitting: Nurse Practitioner

## 2018-08-04 DIAGNOSIS — Z0000 Encounter for general adult medical examination without abnormal findings: Secondary | ICD-10-CM

## 2018-08-04 DIAGNOSIS — Z6841 Body Mass Index (BMI) 40.0 and over, adult: Secondary | ICD-10-CM

## 2018-08-04 NOTE — Telephone Encounter (Signed)
Left vm for pt to return call. Pt needs a weight check/medication follow up for saxenda

## 2018-08-06 ENCOUNTER — Ambulatory Visit (INDEPENDENT_AMBULATORY_CARE_PROVIDER_SITE_OTHER): Payer: BC Managed Care – PPO | Admitting: Psychology

## 2018-08-06 DIAGNOSIS — F3289 Other specified depressive episodes: Secondary | ICD-10-CM

## 2018-08-06 DIAGNOSIS — F418 Other specified anxiety disorders: Secondary | ICD-10-CM

## 2018-08-20 ENCOUNTER — Ambulatory Visit (INDEPENDENT_AMBULATORY_CARE_PROVIDER_SITE_OTHER): Payer: BC Managed Care – PPO | Admitting: Psychology

## 2018-08-20 DIAGNOSIS — F418 Other specified anxiety disorders: Secondary | ICD-10-CM | POA: Diagnosis not present

## 2018-08-20 DIAGNOSIS — F3289 Other specified depressive episodes: Secondary | ICD-10-CM | POA: Diagnosis not present

## 2018-08-28 ENCOUNTER — Telehealth: Payer: Self-pay

## 2018-08-28 NOTE — Telephone Encounter (Signed)
I called pt to schedule her for a office visit weight check and I seen in the chart that she has already been scheduled for 07/28. YRL,RMA

## 2018-09-01 ENCOUNTER — Ambulatory Visit (INDEPENDENT_AMBULATORY_CARE_PROVIDER_SITE_OTHER): Payer: BC Managed Care – PPO | Admitting: Psychology

## 2018-09-01 DIAGNOSIS — F418 Other specified anxiety disorders: Secondary | ICD-10-CM | POA: Diagnosis not present

## 2018-09-01 DIAGNOSIS — F3289 Other specified depressive episodes: Secondary | ICD-10-CM | POA: Diagnosis not present

## 2018-09-08 ENCOUNTER — Other Ambulatory Visit: Payer: Self-pay

## 2018-09-09 ENCOUNTER — Ambulatory Visit: Payer: BC Managed Care – PPO | Admitting: Nurse Practitioner

## 2018-09-09 ENCOUNTER — Encounter: Payer: Self-pay | Admitting: Nurse Practitioner

## 2018-09-09 VITALS — BP 132/88 | HR 84 | Temp 97.9°F | Ht 66.6 in | Wt 279.6 lb

## 2018-09-09 DIAGNOSIS — F320 Major depressive disorder, single episode, mild: Secondary | ICD-10-CM | POA: Diagnosis not present

## 2018-09-09 DIAGNOSIS — Z0000 Encounter for general adult medical examination without abnormal findings: Secondary | ICD-10-CM

## 2018-09-09 DIAGNOSIS — Z6841 Body Mass Index (BMI) 40.0 and over, adult: Secondary | ICD-10-CM

## 2018-09-09 DIAGNOSIS — F32A Depression, unspecified: Secondary | ICD-10-CM

## 2018-09-09 MED ORDER — SAXENDA 18 MG/3ML ~~LOC~~ SOPN: 3.0000 mg | PEN_INJECTOR | Freq: Every day | SUBCUTANEOUS | 1 refills | Status: DC

## 2018-09-09 MED ORDER — INSULIN PEN NEEDLE 31G X 8 MM MISC: 1.0000 | Freq: Every day | 1 refills | Status: DC

## 2018-09-09 NOTE — Progress Notes (Signed)
  Subjective:     Patient ID: Patricia Boyer , female    DOB: 1981-01-13 , 38 y.o.   MRN: [id number]   Chief Complaint  Patient presents with  . Weight Check    HPI  Here for weight check - She is taking Saxenda 3 mg daily.  She is not eating as much.  She is cutting out her sugar.  She is no longer eating out as much.  She is working out 20 minutes 3-4 days.  She is drinking on average 4- 16 oz bottles of water a day.    Wt Readings from Last 3 Encounters: 09/09/18 : 279 lb 9.6 oz (126.8 kg) 05/15/18 : (!) 303 lb 6.4 oz (137.6 kg) 06/16/14 : 265 lb 6.4 oz (120.4 kg)     Past Medical History:  Diagnosis Date  . Anemia   . Arthritis   . Cataract   . GERD (gastroesophageal reflux disease)      Family History  Problem Relation Age of Onset  . Hypertension Mother      Current Outpatient Medications:  .  SAXENDA 18 MG/3ML SOPN, INJECT 3 MG INTO THE SKIN DAILY., Disp: 3 pen, Rfl: 3  Current Facility-Administered Medications:  .  Insulin Pen Needle (NOVOFINE) 40 each, 4 packet, Subcutaneous, PRN, Minette Brine, FNP .  Insulin Pen Needle (NOVOFINE) 40 each, 4 packet, Subcutaneous, PRN, Minette Brine, FNP   No Known Allergies   Review of Systems  Constitutional: Negative.   Respiratory: Negative.   Cardiovascular: Negative.  Negative for chest pain, palpitations and leg swelling.  Neurological: Negative for dizziness and headaches.  Psychiatric/Behavioral: Negative for agitation.     Today's Vitals   09/09/18 0837  BP: 132/88  Pulse: 84  Temp: 97.9 F (36.6 C)  TempSrc: Oral  Weight: 279 lb 9.6 oz (126.8 kg)  Height: 5' 6.6" (1.692 m)  PainSc: 0-No pain   Body mass index is 44.32 kg/m.   Objective:  Physical Exam Constitutional:      Appearance: Normal appearance.  Cardiovascular:     Rate and Rhythm: Normal rate and regular rhythm.  Skin:    Capillary Refill: Capillary refill takes less than 2 seconds.  Neurological:     General: No focal deficit  present.     Mental Status: She is alert and oriented to person, place, and time.  Psychiatric:        Mood and Affect: Mood normal.        Behavior: Behavior normal.        Thought Content: Thought content normal.        Judgment: Judgment normal.         Assessment And Plan:     1. Mild depression ([place])  She is going to counseling  2. Class 3 severe obesity due to excess calories without serious comorbidity with body mass index (BMI) of 40.0 to 44.9 in adult [facility])  Chronic  She is doing well on Saxenda, denies any side effects  Continue Saxenda at 3mg  daily  Increase physical activity to at least 30 minutes each time with a goal of 150 minutes weekly       Minette Brine, FNP    THE PATIENT IS ENCOURAGED TO PRACTICE SOCIAL DISTANCING DUE TO THE COVID-19 PANDEMIC.

## 2018-09-09 NOTE — Patient Instructions (Signed)
   Great job on your weight loss  Increase physical activity to 30 minutes each time

## 2018-09-15 ENCOUNTER — Ambulatory Visit (INDEPENDENT_AMBULATORY_CARE_PROVIDER_SITE_OTHER): Payer: BC Managed Care – PPO | Admitting: Psychology

## 2018-09-15 DIAGNOSIS — F3289 Other specified depressive episodes: Secondary | ICD-10-CM

## 2018-09-15 DIAGNOSIS — F418 Other specified anxiety disorders: Secondary | ICD-10-CM | POA: Diagnosis not present

## 2018-09-24 ENCOUNTER — Telehealth: Payer: Self-pay

## 2018-09-24 NOTE — Telephone Encounter (Signed)
PA for saxenda approved cost to pt $24.99 pt picked up from pharm

## 2018-10-04 ENCOUNTER — Ambulatory Visit (INDEPENDENT_AMBULATORY_CARE_PROVIDER_SITE_OTHER): Payer: BC Managed Care – PPO | Admitting: Psychology

## 2018-10-04 DIAGNOSIS — F418 Other specified anxiety disorders: Secondary | ICD-10-CM | POA: Diagnosis not present

## 2018-10-04 DIAGNOSIS — F3289 Other specified depressive episodes: Secondary | ICD-10-CM

## 2018-10-16 ENCOUNTER — Ambulatory Visit: Payer: BC Managed Care – PPO | Admitting: Psychology

## 2018-10-27 ENCOUNTER — Other Ambulatory Visit: Payer: Self-pay | Admitting: Nurse Practitioner

## 2018-10-27 DIAGNOSIS — Z0000 Encounter for general adult medical examination without abnormal findings: Secondary | ICD-10-CM

## 2018-10-27 DIAGNOSIS — Z6841 Body Mass Index (BMI) 40.0 and over, adult: Secondary | ICD-10-CM

## 2018-11-06 ENCOUNTER — Ambulatory Visit (INDEPENDENT_AMBULATORY_CARE_PROVIDER_SITE_OTHER): Payer: BC Managed Care – PPO | Admitting: Psychology

## 2018-11-06 DIAGNOSIS — F418 Other specified anxiety disorders: Secondary | ICD-10-CM

## 2018-11-06 DIAGNOSIS — F3289 Other specified depressive episodes: Secondary | ICD-10-CM | POA: Diagnosis not present

## 2018-11-11 ENCOUNTER — Ambulatory Visit: Payer: BC Managed Care – PPO | Admitting: Nurse Practitioner

## 2018-11-23 ENCOUNTER — Other Ambulatory Visit: Payer: Self-pay | Admitting: Nurse Practitioner

## 2018-11-23 DIAGNOSIS — Z0000 Encounter for general adult medical examination without abnormal findings: Secondary | ICD-10-CM

## 2018-11-23 DIAGNOSIS — Z6841 Body Mass Index (BMI) 40.0 and over, adult: Secondary | ICD-10-CM

## 2018-11-27 ENCOUNTER — Ambulatory Visit (INDEPENDENT_AMBULATORY_CARE_PROVIDER_SITE_OTHER): Payer: BC Managed Care – PPO | Admitting: Psychology

## 2018-11-27 DIAGNOSIS — F418 Other specified anxiety disorders: Secondary | ICD-10-CM

## 2018-11-27 DIAGNOSIS — F3289 Other specified depressive episodes: Secondary | ICD-10-CM | POA: Diagnosis not present

## 2018-12-14 ENCOUNTER — Other Ambulatory Visit: Payer: Self-pay | Admitting: Nurse Practitioner

## 2018-12-14 DIAGNOSIS — Z6841 Body Mass Index (BMI) 40.0 and over, adult: Secondary | ICD-10-CM

## 2018-12-14 DIAGNOSIS — Z0000 Encounter for general adult medical examination without abnormal findings: Secondary | ICD-10-CM

## 2018-12-16 ENCOUNTER — Ambulatory Visit (INDEPENDENT_AMBULATORY_CARE_PROVIDER_SITE_OTHER): Payer: BC Managed Care – PPO | Admitting: Psychology

## 2018-12-16 DIAGNOSIS — F3289 Other specified depressive episodes: Secondary | ICD-10-CM | POA: Diagnosis not present

## 2018-12-16 DIAGNOSIS — F418 Other specified anxiety disorders: Secondary | ICD-10-CM

## 2018-12-23 ENCOUNTER — Telehealth: Payer: Self-pay

## 2018-12-23 NOTE — Telephone Encounter (Signed)
We received a refill request for Saxenda but patient has not been seen since July she was supposed to f/u in Sept. So I have called her to schedule an appt. I left her a v/m to call the office. YRL,RMA

## 2019-01-15 ENCOUNTER — Ambulatory Visit (INDEPENDENT_AMBULATORY_CARE_PROVIDER_SITE_OTHER): Payer: BC Managed Care – PPO | Admitting: Psychology

## 2019-01-15 DIAGNOSIS — F3289 Other specified depressive episodes: Secondary | ICD-10-CM

## 2019-01-15 DIAGNOSIS — F418 Other specified anxiety disorders: Secondary | ICD-10-CM | POA: Diagnosis not present

## 2019-02-02 ENCOUNTER — Ambulatory Visit (INDEPENDENT_AMBULATORY_CARE_PROVIDER_SITE_OTHER): Payer: BC Managed Care – PPO | Admitting: Psychology

## 2019-02-02 DIAGNOSIS — F418 Other specified anxiety disorders: Secondary | ICD-10-CM

## 2019-02-02 DIAGNOSIS — F3289 Other specified depressive episodes: Secondary | ICD-10-CM | POA: Diagnosis not present

## 2019-02-25 ENCOUNTER — Ambulatory Visit (INDEPENDENT_AMBULATORY_CARE_PROVIDER_SITE_OTHER): Payer: BC Managed Care – PPO | Admitting: Psychology

## 2019-02-25 DIAGNOSIS — F3289 Other specified depressive episodes: Secondary | ICD-10-CM

## 2019-02-25 DIAGNOSIS — F418 Other specified anxiety disorders: Secondary | ICD-10-CM

## 2019-03-11 ENCOUNTER — Ambulatory Visit: Payer: BC Managed Care – PPO | Admitting: Psychology

## 2019-03-19 ENCOUNTER — Ambulatory Visit (INDEPENDENT_AMBULATORY_CARE_PROVIDER_SITE_OTHER): Payer: BC Managed Care – PPO | Admitting: Psychology

## 2019-03-19 DIAGNOSIS — F3289 Other specified depressive episodes: Secondary | ICD-10-CM | POA: Diagnosis not present

## 2019-03-19 DIAGNOSIS — F418 Other specified anxiety disorders: Secondary | ICD-10-CM

## 2019-04-01 ENCOUNTER — Ambulatory Visit: Payer: BC Managed Care – PPO | Admitting: Psychology

## 2019-04-16 ENCOUNTER — Ambulatory Visit (INDEPENDENT_AMBULATORY_CARE_PROVIDER_SITE_OTHER): Payer: BC Managed Care – PPO | Admitting: Psychology

## 2019-04-16 DIAGNOSIS — F3289 Other specified depressive episodes: Secondary | ICD-10-CM

## 2019-04-16 DIAGNOSIS — F418 Other specified anxiety disorders: Secondary | ICD-10-CM | POA: Diagnosis not present

## 2019-05-05 ENCOUNTER — Ambulatory Visit (INDEPENDENT_AMBULATORY_CARE_PROVIDER_SITE_OTHER): Payer: BC Managed Care – PPO | Admitting: Psychology

## 2019-05-05 DIAGNOSIS — F3289 Other specified depressive episodes: Secondary | ICD-10-CM | POA: Diagnosis not present

## 2019-05-05 DIAGNOSIS — F418 Other specified anxiety disorders: Secondary | ICD-10-CM | POA: Diagnosis not present

## 2019-05-13 ENCOUNTER — Ambulatory Visit: Payer: Self-pay | Admitting: Psychology

## 2019-05-21 ENCOUNTER — Encounter: Payer: Self-pay | Admitting: Nurse Practitioner

## 2019-05-21 ENCOUNTER — Ambulatory Visit: Payer: BC Managed Care – PPO | Admitting: Nurse Practitioner

## 2019-05-21 ENCOUNTER — Other Ambulatory Visit: Payer: Self-pay

## 2019-05-21 VITALS — BP 122/80 | HR 65 | Temp 98.1°F | Ht 67.8 in | Wt 285.2 lb

## 2019-05-21 DIAGNOSIS — E66813 Obesity, class 3: Secondary | ICD-10-CM

## 2019-05-21 DIAGNOSIS — Z6841 Body Mass Index (BMI) 40.0 and over, adult: Secondary | ICD-10-CM

## 2019-05-21 DIAGNOSIS — Z0000 Encounter for general adult medical examination without abnormal findings: Secondary | ICD-10-CM | POA: Diagnosis not present

## 2019-05-21 MED ORDER — INSULIN PEN NEEDLE 31G X 8 MM MISC: 1.0000 | Freq: Every day | 1 refills | Status: DC

## 2019-05-21 MED ORDER — SAXENDA 18 MG/3ML ~~LOC~~ SOPN: 3.0000 mg | PEN_INJECTOR | Freq: Every day | SUBCUTANEOUS | 1 refills | Status: DC

## 2019-05-21 MED ORDER — SAXENDA 18 MG/3ML ~~LOC~~ SOPN: 3.0000 mg | PEN_INJECTOR | Freq: Every day | SUBCUTANEOUS | 0 refills | Status: DC

## 2019-05-21 NOTE — Patient Instructions (Signed)
Health Maintenance, Female Adopting a healthy lifestyle and getting preventive care are important in promoting health and wellness. Ask your health care provider about:  The right schedule for you to have regular tests and exams.  Things you can do on your own to prevent diseases and keep yourself healthy. What should I know about diet, weight, and exercise? Eat a healthy diet   Eat a diet that includes plenty of vegetables, fruits, low-fat dairy products, and lean protein.  Do not eat a lot of foods that are high in solid fats, added sugars, or sodium. Maintain a healthy weight Body mass index (BMI) is used to identify weight problems. It estimates body fat based on height and weight. Your health care provider can help determine your BMI and help you achieve or maintain a healthy weight. Get regular exercise Get regular exercise. This is one of the most important things you can do for your health. Most adults should:  Exercise for at least 150 minutes each week. The exercise should increase your heart rate and make you sweat (moderate-intensity exercise).  Do strengthening exercises at least twice a week. This is in addition to the moderate-intensity exercise.  Spend less time sitting. Even light physical activity can be beneficial. Watch cholesterol and blood lipids Have your blood tested for lipids and cholesterol at 38 years of age, then have this test every 5 years. Have your cholesterol levels checked more often if:  Your lipid or cholesterol levels are high.  You are older than 38 years of age.  You are at high risk for heart disease. What should I know about cancer screening? Depending on your health history and family history, you may need to have cancer screening at various ages. This may include screening for:  Breast cancer.  Cervical cancer.  Colorectal cancer.  Skin cancer.  Lung cancer. What should I know about heart disease, diabetes, and high blood  pressure? Blood pressure and heart disease  High blood pressure causes heart disease and increases the risk of stroke. This is more likely to develop in people who have high blood pressure readings, are of African descent, or are overweight.  Have your blood pressure checked: ? Every 3-5 years if you are 18-39 years of age. ? Every year if you are 40 years old or older. Diabetes Have regular diabetes screenings. This checks your fasting blood sugar level. Have the screening done:  Once every three years after age 40 if you are at a normal weight and have a low risk for diabetes.  More often and at a younger age if you are overweight or have a high risk for diabetes. What should I know about preventing infection? Hepatitis B If you have a higher risk for hepatitis B, you should be screened for this virus. Talk with your health care provider to find out if you are at risk for hepatitis B infection. Hepatitis C Testing is recommended for:  Everyone born from 1945 through 1965.  Anyone with known risk factors for hepatitis C. Sexually transmitted infections (STIs)  Get screened for STIs, including gonorrhea and chlamydia, if: ? You are sexually active and are younger than 38 years of age. ? You are older than 38 years of age and your health care provider tells you that you are at risk for this type of infection. ? Your sexual activity has changed since you were last screened, and you are at increased risk for chlamydia or gonorrhea. Ask your health care provider if   you are at risk.  Ask your health care provider about whether you are at high risk for HIV. Your health care provider may recommend a prescription medicine to help prevent HIV infection. If you choose to take medicine to prevent HIV, you should first get tested for HIV. You should then be tested every 3 months for as long as you are taking the medicine. Pregnancy  If you are about to stop having your period (premenopausal) and  you may become pregnant, seek counseling before you get pregnant.  Take 400 to 800 micrograms (mcg) of folic acid every day if you become pregnant.  Ask for birth control (contraception) if you want to prevent pregnancy. Osteoporosis and menopause Osteoporosis is a disease in which the bones lose minerals and strength with aging. This can result in bone fractures. If you are 65 years old or older, or if you are at risk for osteoporosis and fractures, ask your health care provider if you should:  Be screened for bone loss.  Take a calcium or vitamin D supplement to lower your risk of fractures.  Be given hormone replacement therapy (HRT) to treat symptoms of menopause. Follow these instructions at home: Lifestyle  Do not use any products that contain nicotine or tobacco, such as cigarettes, e-cigarettes, and chewing tobacco. If you need help quitting, ask your health care provider.  Do not use street drugs.  Do not share needles.  Ask your health care provider for help if you need support or information about quitting drugs. Alcohol use  Do not drink alcohol if: ? Your health care provider tells you not to drink. ? You are pregnant, may be pregnant, or are planning to become pregnant.  If you drink alcohol: ? Limit how much you use to 0-1 drink a day. ? Limit intake if you are breastfeeding.  Be aware of how much alcohol is in your drink. In the [place] one drink equals one 12 oz bottle of beer (355 mL), one 5 oz glass of wine (148 mL), or one 1 oz glass of hard liquor (44 mL). General instructions  Schedule regular health, dental, and eye exams.  Stay current with your vaccines.  Tell your health care provider if: ? You often feel depressed. ? You have ever been abused or do not feel safe at home. Summary  Adopting a healthy lifestyle and getting preventive care are important in promoting health and wellness.  Follow your health care provider's instructions about healthy  diet, exercising, and getting tested or screened for diseases.  Follow your health care provider's instructions on monitoring your cholesterol and blood pressure. This information is not intended to replace advice given to you by your health care provider. Make sure you discuss any questions you have with your health care provider. Document Revised: 01/22/2018 Document Reviewed: 01/22/2018 Elsevier Patient Education  2020 Elsevier Inc.  

## 2019-05-21 NOTE — Progress Notes (Signed)
Subjective:     Patient ID: Patricia Boyer , female    DOB: 1981/02/05 , 39 y.o.   MRN: [id number]   Chief Complaint  Patient presents with  . Annual Exam   The patient states she uses none for birth control.  Patient's last menstrual period was 05/05/2019.. Negative for Dysmenorrhea and Negative for Menorrhagia Mammogram never done age is 39 y/o.  Negative for: breast discharge, breast lump(s), breast pain and breast self exam.  Pertinent negatives include abnormal bleeding (hematology), anxiety, decreased libido, depression, difficulty falling sleep, dyspareunia, history of infertility, nocturia, sexual dysfunction, sleep disturbances, urinary incontinence, urinary urgency, vaginal discharge and vaginal itching. Diet regular, she is eating more bad foods. She is drinking more sodas.  The patient states her exercise level is minimal, she is walking more.      The patient's tobacco use is:  Social History   Tobacco Use  Smoking Status Never Smoker  Smokeless Tobacco Never Used   She has been exposed to passive smoke. The patient's alcohol use is:  Social History   Substance and Sexual Activity  Alcohol Use Yes  . Alcohol/week: 1.0 standard drinks  . Types: 1 Cans of beer per week   Additional information: Last pap 2020  HPI  Here for HM  She is working at [employer]. She is to graduate in May 2021 with her doctorate.   She had been on Saxenda.  She had gotten pregnant in July then August she had an abortion. She stopped the Saxenda. She had been having abdominal pain.  Her menstrual cycle has been regular since November.    Wt Readings from Last 3 Encounters: 05/21/19 : 285 lb 3.2 oz (129.4 kg) 09/09/18 : 279 lb 9.6 oz (126.8 kg) 05/15/18 : (!) 303 lb 6.4 oz (137.6 kg)  In August her weight was down to 271 lbs.       Past Medical History:  Diagnosis Date  . Anemia   . Arthritis   . Cataract   . GERD (gastroesophageal reflux disease)      Family History   Problem Relation Age of Onset  . Hypertension Mother     No current outpatient medications on file.  Current Facility-Administered Medications:  .  Insulin Pen Needle (NOVOFINE) 40 each, 4 packet, Subcutaneous, PRN, [name], FNP .  Insulin Pen Needle (NOVOFINE) 40 each, 4 packet, Subcutaneous, PRN, [name], FNP   No Known Allergies   Review of Systems  Constitutional: Negative.   HENT: Negative.   Eyes: Negative.   Respiratory: Negative.  Negative for cough.   Cardiovascular: Negative.   Gastrointestinal: Negative.   Endocrine: Negative.   Genitourinary: Negative.   Musculoskeletal: Negative.   Skin: Negative.   Allergic/Immunologic: Negative.   Neurological: Negative.  Negative for dizziness and headaches.  Hematological: Negative.   Psychiatric/Behavioral: Negative.      Today's Vitals   05/21/19 1434  BP: 122/80  Pulse: 65  Temp: 98.1 F (36.7 C)  TempSrc: Oral  Weight: 285 lb 3.2 oz (129.4 kg)  Height: 5' 7.8" (1.722 m)  PainSc: 0-No pain   Body mass index is 43.62 kg/m.   Objective:  Physical Exam Constitutional:      General: She is not in acute distress.    Appearance: Normal appearance. She is well-developed. She is obese.  HENT:     Head: Normocephalic and atraumatic.     Right Ear: Hearing, tympanic membrane, ear canal and external ear normal. There is no  impacted cerumen.     Left Ear: Hearing, tympanic membrane, ear canal and external ear normal. There is no impacted cerumen.     Nose:     Comments: Deferred - mask    Mouth/Throat:     Comments: Deferred - mask  Eyes:     General: Lids are normal.     Conjunctiva/sclera: Conjunctivae normal.     Pupils: Pupils are equal, round, and reactive to light.     Funduscopic exam:    Right eye: No papilledema.        Left eye: No papilledema.  Neck:     Thyroid: No thyroid mass.     Vascular: No carotid bruit.  Cardiovascular:     Rate and Rhythm: Normal rate and regular rhythm.      Pulses: Normal pulses.     Heart sounds: Normal heart sounds. No murmur.  Pulmonary:     Effort: Pulmonary effort is normal. No respiratory distress.     Breath sounds: Normal breath sounds.  Chest:     Chest wall: No mass.     Breasts:        Right: Normal. No mass or tenderness.        Left: Normal. No mass or tenderness.  Abdominal:     General: Abdomen is flat. Bowel sounds are normal.     Palpations: Abdomen is soft.  Genitourinary:    Uterus: Normal.      Adnexa: Right adnexa normal and left adnexa normal.       Right: No mass or tenderness.         Left: No mass or tenderness.    Musculoskeletal:        General: No swelling. Normal range of motion.     Cervical back: Full passive range of motion without pain, normal range of motion and neck supple.     Right lower leg: No edema.     Left lower leg: No edema.  Skin:    General: Skin is warm and dry.     Capillary Refill: Capillary refill takes less than 2 seconds.  Neurological:     General: No focal deficit present.     Mental Status: She is alert and oriented to person, place, and time.     Cranial Nerves: No cranial nerve deficit.     Sensory: No sensory deficit.  Psychiatric:        Mood and Affect: Mood normal.        Behavior: Behavior normal.        Thought Content: Thought content normal.        Judgment: Judgment normal.         Assessment And Plan:     1. Health maintenance examination . Behavior modifications discussed and diet history reviewed.   . Pt will continue to exercise regularly and modify diet with low GI, plant based foods and decrease intake of processed foods.  . Recommend intake of daily multivitamin, Vitamin D, and calcium.  . Recommend for preventive screenings, as well as recommend immunizations that include influenza, TDAP - Liraglutide -Weight Management (SAXENDA) 18 MG/3ML SOPN; Inject 0.5 mLs (3 mg total) into the skin daily.  Dispense: 5 pen; Refill: 0  2. Class 3 severe obesity  due to excess calories without serious comorbidity with body mass index (BMI) of 40.0 to 44.9 in adult [facility])  Chronic  Discussed healthy diet and regular exercise options   Encouraged to exercise at least 150 minutes per  week with 2 days of strength training  Return in 2 months for weight check. - Insulin Pen Needle 31G X 8 MM MISC; 1 each by Does not apply route daily.  Dispense: 90 each; Refill: 1 - Liraglutide -Weight Management (SAXENDA) 18 MG/3ML SOPN; Inject 0.5 mLs (3 mg total) into the skin daily.  Dispense: 5 pen; Refill: 0  3. BMI 45.0-49.9, adult ([facility]CC)  - Liraglutide -Weight Management (SAXENDA) 18 MG/3ML SOPN; Inject 0.5 mLs (3 mg total) into the skin daily.  Dispense: 5 pen; Refill: 1     [name], FNP    THE PATIENT IS ENCOURAGED TO PRACTICE SOCIAL DISTANCING DUE TO THE COVID-19 PANDEMIC.

## 2019-05-22 LAB — CMP14+EGFR
ALT: 12 IU/L (ref 0–32)
AST: 18 IU/L (ref 0–40)
Albumin/Globulin Ratio: 1.4 (ref 1.2–2.2)
Albumin: 4.1 g/dL (ref 3.8–4.8)
Alkaline Phosphatase: 110 IU/L (ref 39–117)
BUN/Creatinine Ratio: 14 (ref 9–23)
BUN: 13 mg/dL (ref 6–20)
Bilirubin Total: 0.4 mg/dL (ref 0.0–1.2)
CO2: 23 mmol/L (ref 20–29)
Calcium: 9.3 mg/dL (ref 8.7–10.2)
Chloride: 103 mmol/L (ref 96–106)
Creatinine, Ser: 0.92 mg/dL (ref 0.57–1.00)
GFR calc Af Amer: 92 mL/min/{1.73_m2} (ref >59)
GFR calc non Af Amer: 80 mL/min/{1.73_m2} (ref >59)
Globulin, Total: 2.9 g/dL (ref 1.5–4.5)
Glucose: 63 mg/dL — ABNORMAL LOW (ref 65–99)
Potassium: 3.8 mmol/L (ref 3.5–5.2)
Sodium: 138 mmol/L (ref 134–144)
Total Protein: 7 g/dL (ref 6.0–8.5)

## 2019-05-22 LAB — LIPID PANEL
Chol/HDL Ratio: 2.2 ratio (ref 0.0–4.4)
Cholesterol, Total: 173 mg/dL (ref 100–199)
HDL: 80 mg/dL (ref >39)
LDL Chol Calc (NIH): 82 mg/dL (ref 0–99)
Triglycerides: 53 mg/dL (ref 0–149)
VLDL Cholesterol Cal: 11 mg/dL (ref 5–40)

## 2019-05-22 LAB — CBC
Hematocrit: 38.3 % (ref 34.0–46.6)
Hemoglobin: 13.1 g/dL (ref 11.1–15.9)
MCH: 29.4 pg (ref 26.6–33.0)
MCHC: 34.2 g/dL (ref 31.5–35.7)
MCV: 86 fL (ref 79–97)
Platelets: 295 10*3/uL (ref 150–450)
RBC: 4.46 x10E6/uL (ref 3.77–5.28)
RDW: 14.1 % (ref 11.7–15.4)
WBC: 7 10*3/uL (ref 3.4–10.8)

## 2019-05-22 LAB — HEMOGLOBIN A1C
Est. average glucose Bld gHb Est-mCnc: 103 mg/dL
Hgb A1c MFr Bld: 5.2 % (ref 4.8–5.6)

## 2019-06-01 ENCOUNTER — Ambulatory Visit (INDEPENDENT_AMBULATORY_CARE_PROVIDER_SITE_OTHER): Payer: BC Managed Care – PPO | Admitting: Psychology

## 2019-06-01 DIAGNOSIS — F3289 Other specified depressive episodes: Secondary | ICD-10-CM | POA: Diagnosis not present

## 2019-06-01 DIAGNOSIS — F418 Other specified anxiety disorders: Secondary | ICD-10-CM | POA: Diagnosis not present

## 2019-06-22 ENCOUNTER — Ambulatory Visit (INDEPENDENT_AMBULATORY_CARE_PROVIDER_SITE_OTHER): Payer: BC Managed Care – PPO | Admitting: Psychology

## 2019-06-22 DIAGNOSIS — F3289 Other specified depressive episodes: Secondary | ICD-10-CM | POA: Diagnosis not present

## 2019-06-22 DIAGNOSIS — F418 Other specified anxiety disorders: Secondary | ICD-10-CM

## 2019-07-17 ENCOUNTER — Ambulatory Visit (INDEPENDENT_AMBULATORY_CARE_PROVIDER_SITE_OTHER): Payer: BC Managed Care – PPO | Admitting: Psychology

## 2019-07-17 DIAGNOSIS — F418 Other specified anxiety disorders: Secondary | ICD-10-CM

## 2019-07-17 DIAGNOSIS — F3289 Other specified depressive episodes: Secondary | ICD-10-CM

## 2019-07-21 ENCOUNTER — Other Ambulatory Visit: Payer: Self-pay

## 2019-07-21 ENCOUNTER — Ambulatory Visit: Payer: BC Managed Care – PPO | Admitting: Nurse Practitioner

## 2019-07-21 ENCOUNTER — Encounter: Payer: Self-pay | Admitting: Nurse Practitioner

## 2019-07-21 VITALS — BP 130/80 | HR 94 | Temp 97.9°F | Ht 66.8 in | Wt 275.0 lb

## 2019-07-21 DIAGNOSIS — H1031 Unspecified acute conjunctivitis, right eye: Secondary | ICD-10-CM

## 2019-07-21 DIAGNOSIS — R4586 Emotional lability: Secondary | ICD-10-CM | POA: Diagnosis not present

## 2019-07-21 DIAGNOSIS — Z6841 Body Mass Index (BMI) 40.0 and over, adult: Secondary | ICD-10-CM

## 2019-07-21 MED ORDER — BACITRACIN-POLYMYXIN B 500-10000 UNIT/GM OP OINT: TOPICAL_OINTMENT | Freq: Two times a day (BID) | OPHTHALMIC | 0 refills | Status: AC

## 2019-07-21 MED ORDER — BUPROPION HCL ER (XL) 150 MG PO TB24: 150.0000 mg | ORAL_TABLET | ORAL | 2 refills | Status: DC

## 2019-07-21 MED ORDER — SAXENDA 18 MG/3ML ~~LOC~~ SOPN: 3.0000 mg | PEN_INJECTOR | Freq: Every day | SUBCUTANEOUS | 1 refills | Status: DC

## 2019-07-21 NOTE — Progress Notes (Signed)
This visit occurred during the SARS-CoV-2 public health emergency.  Safety protocols were in place, including screening questions prior to the visit, additional usage of staff PPE, and extensive cleaning of exam room while observing appropriate contact time as indicated for disinfecting solutions.  Subjective:     Patient ID: Patricia Boyer , female    DOB: 1980-12-31 , 39 y.o.   MRN: [id number]   Chief Complaint  Patient presents with  . Weight Check    HPI  She was seen at the Urgent care in April with an allergic reaction to her head, arms and hands.  She has been using the same products for years. She reports she had pus to her hair.  She had a steroid prednisone, cephalexin with medicated shampoo.  She would like a referral to an allergist to see what she is allergic.    She is here for weight check Has not been on Saxenda for the last 3 weeks. Has gained 3 lbs during that time frame  Wt Readings from Last 3 Encounters: 07/21/19 : 275 lb (124.7 kg) 05/21/19 : 285 lb 3.2 oz (129.4 kg) 09/09/18 : 279 lb 9.6 oz (126.8 kg)  She is seeing a therapist who feels she is worrying too much and recommends to start an antidepressant.   She took her Covid vaccine in March.    Right eye redness    Past Medical History:  Diagnosis Date  . Anemia   . Arthritis   . Cataract   . GERD (gastroesophageal reflux disease)      Family History  Problem Relation Age of Onset  . Hypertension Mother      Current Outpatient Medications:  .  Insulin Pen Needle 31G X 8 MM MISC, 1 each by Does not apply route daily., Disp: 90 each, Rfl: 1 .  Liraglutide -Weight Management (SAXENDA) 18 MG/3ML SOPN, Inject 0.5 mLs (3 mg total) into the skin daily., Disp: 5 pen, Rfl: 1  Current Facility-Administered Medications:  .  Insulin Pen Needle (NOVOFINE) 40 each, 4 packet, Subcutaneous, PRN, Minette Brine, FNP .  Insulin Pen Needle (NOVOFINE) 40 each, 4 packet, Subcutaneous, PRN, Minette Brine, FNP    No Known Allergies   Review of Systems  Constitutional: Negative.  Negative for fatigue.  Eyes: Negative for redness.  Respiratory: Negative.  Negative for cough.   Cardiovascular: Negative.  Negative for chest pain, palpitations and leg swelling.  Endocrine: Negative for polydipsia, polyphagia and polyuria.  Neurological: Negative for dizziness and headaches.  Psychiatric/Behavioral: Negative.      Today's Vitals   07/21/19 1459  BP: 130/80  Pulse: 94  Temp: 97.9 F (36.6 C)  Weight: 275 lb (124.7 kg)  Height: 5' 6.8" (1.697 m)  PainSc: 0-No pain   Body mass index is 43.33 kg/m.   Objective:  Physical Exam Constitutional:      General: She is not in acute distress.    Appearance: Normal appearance. She is obese.  Eyes:     General: Lids are normal.     Comments: Right sclera with erythema and has tearing to right eye  Cardiovascular:     Rate and Rhythm: Normal rate and regular rhythm.  Skin:    General: Skin is warm and dry.     Comments: Few areas of hypopigmentation to arms bilaterally  Neurological:     Mental Status: She is alert.         Assessment And Plan:     1. Class 3 severe  obesity due to excess calories without serious comorbidity with body mass index (BMI) of 40.0 to 44.9 in adult [facility])  Chronic she has lost 10 lbs  Tolerating saxenda well  Continue with regular exercise - Liraglutide -Weight Management (SAXENDA) 18 MG/3ML SOPN; Inject 0.5 mLs (3 mg total) into the skin daily.  Dispense: 5 pen; Refill: 1 - buPROPion (WELLBUTRIN XL) 150 MG 24 hr tablet; Take 1 tablet (150 mg total) by mouth every morning.  Dispense: 30 tablet; Refill: 2  2. BMI 45.0-49.9, adult (HCC)  - Liraglutide -Weight Management (SAXENDA) 18 MG/3ML SOPN; Inject 0.5 mLs (3 mg total) into the skin daily.  Dispense: 5 pen; Refill: 1  3. Acute conjunctivitis of right eye, unspecified acute conjunctivitis type  Erythema present to sclera with tearing  Will treat with  antibiotic eye drops and recommend to avoid wearing contacts until completion of antibiotics - bacitracin-polymyxin b (POLYSPORIN) ophthalmic ointment; Place into both eyes 2 (two) times daily for 10 days. Place a 1/2 inch ribbon of ointment into the lower eyelid.  Dispense: 3.5 g; Refill: 0  4. Mood changes  Recommended by her therapist to start her on medications  Will try buproprion and discussed side effects to include seizure risk and thought changes, she is advised to notify me with any of these symptoms  Discussed also staying on medication for at least 6 months and is aware takes up to 6 weeks for therapeutic effect - buPROPion (WELLBUTRIN XL) 150 MG 24 hr tablet; Take 1 tablet (150 mg total) by mouth every morning.  Dispense: 30 tablet; Refill: 2   [name], FNP    THE PATIENT IS ENCOURAGED TO PRACTICE SOCIAL DISTANCING DUE TO THE COVID-19 PANDEMIC.

## 2019-08-10 ENCOUNTER — Ambulatory Visit (INDEPENDENT_AMBULATORY_CARE_PROVIDER_SITE_OTHER): Payer: BC Managed Care – PPO | Admitting: Psychology

## 2019-08-10 DIAGNOSIS — F418 Other specified anxiety disorders: Secondary | ICD-10-CM | POA: Diagnosis not present

## 2019-08-10 DIAGNOSIS — F3289 Other specified depressive episodes: Secondary | ICD-10-CM

## 2019-08-12 ENCOUNTER — Other Ambulatory Visit: Payer: Self-pay | Admitting: Nurse Practitioner

## 2019-08-12 DIAGNOSIS — Z6841 Body Mass Index (BMI) 40.0 and over, adult: Secondary | ICD-10-CM

## 2019-08-12 DIAGNOSIS — R4586 Emotional lability: Secondary | ICD-10-CM

## 2019-09-07 ENCOUNTER — Ambulatory Visit (INDEPENDENT_AMBULATORY_CARE_PROVIDER_SITE_OTHER): Payer: BC Managed Care – PPO | Admitting: Psychology

## 2019-09-07 DIAGNOSIS — F418 Other specified anxiety disorders: Secondary | ICD-10-CM | POA: Diagnosis not present

## 2019-09-07 DIAGNOSIS — F3289 Other specified depressive episodes: Secondary | ICD-10-CM | POA: Diagnosis not present

## 2019-09-21 ENCOUNTER — Other Ambulatory Visit: Payer: Self-pay

## 2019-09-21 ENCOUNTER — Encounter: Payer: Self-pay | Admitting: Nurse Practitioner

## 2019-09-21 ENCOUNTER — Ambulatory Visit: Payer: BC Managed Care – PPO | Admitting: Nurse Practitioner

## 2019-09-21 VITALS — BP 132/84 | HR 87 | Temp 97.6°F | Ht 66.4 in | Wt 276.6 lb

## 2019-09-21 DIAGNOSIS — R4586 Emotional lability: Secondary | ICD-10-CM

## 2019-09-21 DIAGNOSIS — Z6841 Body Mass Index (BMI) 40.0 and over, adult: Secondary | ICD-10-CM

## 2019-09-21 MED ORDER — SAXENDA 18 MG/3ML ~~LOC~~ SOPN: 3.0000 mg | PEN_INJECTOR | Freq: Every day | SUBCUTANEOUS | 1 refills | Status: DC

## 2019-09-21 MED ORDER — VORTIOXETINE HBR 5 MG PO TABS: 5.0000 mg | ORAL_TABLET | Freq: Every day | ORAL | 2 refills | Status: DC

## 2019-09-21 NOTE — Progress Notes (Signed)
This visit occurred during the SARS-CoV-2 public health emergency.  Safety protocols were in place, including screening questions prior to the visit, additional usage of staff PPE, and extensive cleaning of exam room while observing appropriate contact time as indicated for disinfecting solutions.  Subjective:     Patient ID: Patricia Boyer , female    DOB: 1980-07-25 , 39 y.o.   MRN: [id number]   Chief Complaint  Patient presents with  . Weight Check    HPI  She presents today for weight check for saxenda currently at 2.4 mg. She is eating a balanced diet but reports that she has not exercised at all. 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 She is off on the weekends but states that she is catching up on sleep and needs to recover. She will have better hours in September and plans to start an exercise regimen.  She continues to get full and will not eat a lot.  She is having cotton mouth and has to make herself drink water.  She drinks approximately 36 oz a day.    She stopped the Wellbutrin a week ago due to hair loss. She is interested in trying something else.  She is eating protein shakes and packs her lunch. She eats almonds, pistachios and cheez it's. She is interested in core life.  Wt Readings from Last 3 Encounters: 09/21/19 : 276 lb 9.6 oz (125.5 kg) 07/21/19 : 275 lb (124.7 kg) 05/21/19 : 285 lb 3.2 oz (129.4 kg)    Past Medical History:  Diagnosis Date  . Anemia   . Arthritis   . Cataract   . GERD (gastroesophageal reflux disease)      Family History  Problem Relation Age of Onset  . Hypertension Mother      Current Outpatient Medications:  .  Insulin Pen Needle 31G X 8 MM MISC, 1 each by Does not apply route daily., Disp: 90 each, Rfl: 1 .  Liraglutide -Weight Management (SAXENDA) 18 MG/3ML SOPN, Inject 0.5 mLs (3 mg total) into the skin daily., Disp: 15 mL, Rfl: 1 .  vortioxetine HBr (TRINTELLIX) 5 MG TABS tablet, Take 1  tablet (5 mg total) by mouth daily., Disp: 30 tablet, Rfl: 2  Current Facility-Administered Medications:  .  Insulin Pen Needle (NOVOFINE) 40 each, 4 packet, Subcutaneous, PRN, [name], FNP .  Insulin Pen Needle (NOVOFINE) 40 each, 4 packet, Subcutaneous, PRN, [name], FNP   No Known Allergies   Review of Systems  Constitutional: Negative.  Negative for fatigue.  Respiratory: Negative.   Cardiovascular: Negative.  Negative for chest pain and leg swelling.  Musculoskeletal: Negative.      Today's Vitals   09/21/19 1003  BP: 132/84  Pulse: 87  Temp: 97.6 F (36.4 C)  TempSrc: Oral  Weight: 276 lb 9.6 oz (125.5 kg)  Height: 5' 6.4" (1.687 m)  PainSc: 0-No pain   Body mass index is 44.11 kg/m.   Objective:  Physical Exam Vitals reviewed.  Constitutional:      General: She is not in acute distress.    Appearance: Normal appearance. She is well-developed. She is obese.  Cardiovascular:     Rate and Rhythm: Normal rate and regular rhythm.     Pulses: Normal pulses.     Heart sounds: Normal heart sounds. No murmur heard.   Pulmonary:     Effort: Pulmonary effort is normal. No respiratory distress.     Breath sounds: Normal breath  sounds.  Chest:     Chest wall: No tenderness.  Musculoskeletal:        General: Normal range of motion.  Skin:    General: Skin is warm and dry.     Capillary Refill: Capillary refill takes less than 2 seconds.  Neurological:     General: No focal deficit present.     Mental Status: She is alert and oriented to person, place, and time.     Cranial Nerves: No cranial nerve deficit.  Psychiatric:        Mood and Affect: Mood normal.        Behavior: Behavior normal.        Thought Content: Thought content normal.        Judgment: Judgment normal.         Assessment And Plan:     1. Class 3 severe obesity due to excess calories without serious comorbidity with body mass index (BMI) of 40.0 to 44.9 in adult  [facility])  Chronic  Discussed healthy diet and regular exercise options  Encouraged to exercise at least 150 minutes per week with 2 days of strength training, increase slowly Encouraged to park further when at the store, take stairs instead of elevators and to walk in place during commercials. Increase water intake to at least one gallon of water daily.  She is not at the maximum dose of Saxenda, she is advised to increase by 5 clicks for a week then increase to the 3 mg, this may help with her weight loss.   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    Return in 2 months for weight check.  At this time she is not interested in Core Life as she gets her life back on track - Liraglutide -Weight Management (SAXENDA) 18 MG/3ML SOPN; Inject 0.5 mLs (3 mg total) into the skin daily.  Dispense: 15 mL; Refill: 1  2. Mood changes  She feels like her wellbutrin caused hair loss will change to trintellix and encouraged to not stop abruptly - vortioxetine HBr (TRINTELLIX) 5 MG TABS tablet; Take 1 tablet (5 mg total) by mouth daily.  Dispense: 30 tablet; Refill: 2  3. BMI 45.0-49.9, adult [facility])  She is encouraged to strive for BMI less than 30 to decrease cardiac risk. - Liraglutide -Weight Management (SAXENDA) 18 MG/3ML SOPN; Inject 0.5 mLs (3 mg total) into the skin daily.  Dispense: 15 mL; Refill: 1     Patient was given opportunity to ask questions. Patient verbalized understanding of the plan and was able to repeat key elements of the plan. All questions were answered to their satisfaction.  [name], FNP   I, [name], FNP, have reviewed all documentation for this visit. The documentation on 09/21/19 for the exam, diagnosis, procedures, and orders are all accurate and complete.  THE PATIENT IS ENCOURAGED TO PRACTICE SOCIAL DISTANCING DUE TO THE COVID-19 PANDEMIC.

## 2019-09-21 NOTE — Progress Notes (Deleted)
132/85 This visit occurred during the SARS-CoV-2 public health emergency.  Safety protocols were in place, including screening questions prior to the visit, additional usage of staff PPE, and extensive cleaning of exam room while observing appropriate contact time as indicated for disinfecting solutions.  Subjective:     Patient ID: Patricia Boyer , female    DOB: 10-14-1980 , 39 y.o.   MRN: [id number]   Chief Complaint  Patient presents with  . Weight Check    HPI  Here for weight loss. She is at 2.4 mg. She continues to get full and will not eat a lot.  She is having cotton mouth and has to make herself drink water.  She drinks approximately 36 oz a day.    Wt Readings from Last 3 Encounters: 09/21/19 : 276 lb 9.6 oz (125.5 kg) 07/21/19 : 275 lb (124.7 kg) 05/21/19 : 285 lb 3.2 oz (129.4 kg)  She continues to take [place] daily. When her hours at work changed she has been unable to exercise.  She has tried to continue to monitor her eating.    She reports she has stopped taking wellbutrin due to she feels her hair was thinning - she lost 2 "hair locs".      Past Medical History:  Diagnosis Date  . Anemia   . Arthritis   . Cataract   . GERD (gastroesophageal reflux disease)      Family History  Problem Relation Age of Onset  . Hypertension Mother      Current Outpatient Medications:  .  Insulin Pen Needle 31G X 8 MM MISC, 1 each by Does not apply route daily., Disp: 90 each, Rfl: 1 .  Liraglutide -Weight Management (SAXENDA) 18 MG/3ML SOPN, Inject 0.5 mLs (3 mg total) into the skin daily., Disp: 5 pen, Rfl: 1 .  buPROPion (WELLBUTRIN XL) 150 MG 24 hr tablet, TAKE 1 TABLET BY MOUTH EVERY DAY IN THE MORNING (Patient not taking: Reported on 09/21/2019), Disp: 90 tablet, Rfl: 1 .  vortioxetine HBr (TRINTELLIX) 5 MG TABS tablet, Take 1 tablet (5 mg total) by mouth daily., Disp: 30 tablet, Rfl: 2  Current Facility-Administered Medications:  .  Insulin Pen Needle (NOVOFINE) 40  each, 4 packet, Subcutaneous, PRN, [name], FNP .  Insulin Pen Needle (NOVOFINE) 40 each, 4 packet, Subcutaneous, PRN, [name], FNP   No Known Allergies   Review of Systems  Constitutional: Negative.  Negative for fatigue.  Respiratory: Negative.   Cardiovascular: Negative.  Negative for chest pain, palpitations and leg swelling.  Neurological: Negative for dizziness and headaches.  Psychiatric/Behavioral: Negative.      Today's Vitals   09/21/19 1003  BP: 132/84  Pulse: 87  Temp: 97.6 F (36.4 C)  TempSrc: Oral  Weight: 276 lb 9.6 oz (125.5 kg)  Height: 5' 6.4" (1.687 m)  PainSc: 0-No pain   Body mass index is 44.11 kg/m.   Objective:  Physical Exam Constitutional:      General: She is not in acute distress.    Appearance: Normal appearance.  Cardiovascular:     Rate and Rhythm: Normal rate and regular rhythm.     Pulses: Normal pulses.  Skin:    General: Skin is warm and dry.     Coloration: Skin is not jaundiced.  Neurological:     General: No focal deficit present.     Mental Status: She is alert and oriented to person, place, and time.     Cranial Nerves: No cranial nerve  deficit.  Psychiatric:        Mood and Affect: Mood normal.        Thought Content: Thought content normal.        Judgment: Judgment normal.         Assessment And Plan:     1. Class 3 severe obesity due to excess calories without serious comorbidity with body mass index (BMI) of 40.0 to 44.9 in adult [facility])  2. Mood changes - vortioxetine HBr (TRINTELLIX) 5 MG TABS tablet; Take 1 tablet (5 mg total) by mouth daily.  Dispense: 30 tablet; Refill: 2     Patient was given opportunity to ask questions. Patient verbalized understanding of the plan and was able to repeat key elements of the plan. All questions were answered to their satisfaction.  [name], FNP   I, [name], FNP, have reviewed all documentation for this visit. The documentation on 09/21/19 for the  exam, diagnosis, procedures, and orders are all accurate and complete.   THE PATIENT IS ENCOURAGED TO PRACTICE SOCIAL DISTANCING DUE TO THE COVID-19 PANDEMIC.

## 2019-09-21 NOTE — Patient Instructions (Addendum)
Declined flu vaccine - Explained risk of not taking influenza vaccine. Encouraged to take vitamin c and zinc to help boost immune system   [email or web address].org/  Weight watchers for healthy foods.

## 2019-10-05 ENCOUNTER — Ambulatory Visit (INDEPENDENT_AMBULATORY_CARE_PROVIDER_SITE_OTHER): Payer: BC Managed Care – PPO | Admitting: Psychology

## 2019-10-05 DIAGNOSIS — F418 Other specified anxiety disorders: Secondary | ICD-10-CM

## 2019-10-05 DIAGNOSIS — F3289 Other specified depressive episodes: Secondary | ICD-10-CM | POA: Diagnosis not present

## 2019-10-22 ENCOUNTER — Ambulatory Visit: Payer: BC Managed Care – PPO | Admitting: Nurse Practitioner

## 2019-10-22 ENCOUNTER — Encounter: Payer: Self-pay | Admitting: Nurse Practitioner

## 2019-10-22 ENCOUNTER — Other Ambulatory Visit: Payer: Self-pay

## 2019-10-22 VITALS — BP 148/102 | HR 76 | Temp 98.4°F | Ht 66.4 in | Wt 275.0 lb

## 2019-10-22 DIAGNOSIS — R4586 Emotional lability: Secondary | ICD-10-CM | POA: Diagnosis not present

## 2019-10-22 DIAGNOSIS — R109 Unspecified abdominal pain: Secondary | ICD-10-CM | POA: Diagnosis not present

## 2019-10-22 DIAGNOSIS — R030 Elevated blood-pressure reading, without diagnosis of hypertension: Secondary | ICD-10-CM

## 2019-10-22 DIAGNOSIS — Z6841 Body Mass Index (BMI) 40.0 and over, adult: Secondary | ICD-10-CM

## 2019-10-22 LAB — POCT URINALYSIS DIPSTICK
Bilirubin, UA: NEGATIVE
Blood, UA: NEGATIVE
Glucose, UA: NEGATIVE
Ketones, UA: NEGATIVE
Leukocytes, UA: NEGATIVE
Nitrite, UA: NEGATIVE
Protein, UA: NEGATIVE
Spec Grav, UA: 1.015 (ref 1.010–1.025)
Urobilinogen, UA: 0.2 E.U./dL
pH, UA: 5.5 (ref 5.0–8.0)

## 2019-10-22 NOTE — Progress Notes (Signed)
This visit occurred during the SARS-CoV-2 public health emergency.  Safety protocols were in place, including screening questions prior to the visit, additional usage of staff PPE, and extensive cleaning of exam room while observing appropriate contact time as indicated for disinfecting solutions.  Subjective:     Patient ID: Patricia Boyer , female    DOB: 05-May-1980 , 39 y.o.   MRN: [id number]   Chief Complaint  Patient presents with  . Weight Check  . Flank Pain    HPI     Her weight at home was 271 this morning.  She has been eating more healthier options. She is drinking up to 4 bottles of water.  She is feeling like her clothes are feeling more loose.  The Saxenda is helpful with her not overeating.    Wt Readings from Last 3 Encounters: 10/22/19 : 275 lb (124.7 kg) 09/21/19 : 276 lb 9.6 oz (125.5 kg) 07/21/19 : 275 lb (124.7 kg)  She had been on Saxenda in August 2020 she stopped taking and then she restarted in April she went down 10 lbs.  She is exercising in the house.  She feels it is hard to get in her water intake   Flank Pain This is a chronic problem. The current episode started 1 to 4 weeks ago (2 weeks). The problem occurs rarely. The quality of the pain is described as aching. Pertinent negatives include no abdominal pain, chest pain, dysuria, headaches, numbness or pelvic pain. (Left side pain and will have pain to groin area, will have pain when she walks.  Will cause her to slow down a little bit. ) Risk factors include obesity. She has tried nothing for the symptoms.     Past Medical History:  Diagnosis Date  . Anemia   . Arthritis   . Cataract   . GERD (gastroesophageal reflux disease)      Family History  Problem Relation Age of Onset  . Hypertension Mother      Current Outpatient Medications:  .  Insulin Pen Needle 31G X 8 MM MISC, 1 each by Does not apply route daily., Disp: 90 each, Rfl: 1 .  Liraglutide -Weight Management (SAXENDA) 18  MG/3ML SOPN, Inject 0.5 mLs (3 mg total) into the skin daily., Disp: 15 mL, Rfl: 1 .  vortioxetine HBr (TRINTELLIX) 5 MG TABS tablet, Take 1 tablet (5 mg total) by mouth daily., Disp: 30 tablet, Rfl: 2  Current Facility-Administered Medications:  .  Insulin Pen Needle (NOVOFINE) 40 each, 4 packet, Subcutaneous, PRN, [name], FNP .  Insulin Pen Needle (NOVOFINE) 40 each, 4 packet, Subcutaneous, PRN, [name], FNP   No Known Allergies   Review of Systems  Constitutional: Negative.  Negative for fatigue.  Respiratory: Negative.   Cardiovascular: Negative.  Negative for chest pain and leg swelling.  Gastrointestinal: Negative for abdominal pain.  Genitourinary: Positive for flank pain. Negative for dysuria and pelvic pain.  Neurological: Negative for numbness and headaches.  Psychiatric/Behavioral: Negative.      Today's Vitals   10/22/19 1613  BP: (!) 148/116  Pulse: 76  Temp: 98.4 F (36.9 C)  TempSrc: Oral  Weight: 275 lb (124.7 kg)  Height: 5' 6.4" (1.687 m)  PainSc: 6    Body mass index is 43.85 kg/m.   Objective:  Physical Exam Vitals reviewed.  Constitutional:      General: She is not in acute distress.    Appearance: Normal appearance. She is well-developed. She is obese.  Cardiovascular:  Rate and Rhythm: Normal rate and regular rhythm.     Pulses: Normal pulses.     Heart sounds: Normal heart sounds. No murmur heard.   Pulmonary:     Effort: Pulmonary effort is normal. No respiratory distress.     Breath sounds: Normal breath sounds.  Chest:     Chest wall: No tenderness.  Musculoskeletal:        General: Normal range of motion.  Skin:    General: Skin is warm and dry.     Capillary Refill: Capillary refill takes less than 2 seconds.  Neurological:     General: No focal deficit present.     Mental Status: She is alert and oriented to person, place, and time.     Cranial Nerves: No cranial nerve deficit.  Psychiatric:        Mood and  Affect: Mood normal.        Behavior: Behavior normal.        Thought Content: Thought content normal.        Judgment: Judgment normal.         Assessment And Plan:     1. Class 3 severe obesity due to excess calories without serious comorbidity with body mass index (BMI) of 40.0 to 44.9 in adult [facility])  Chronic, she is currently taking Saxenda 3 mg and tolerating well  Her weight has maintained however she feels her clothes are fitting more loosely, discussed losing inches is good as well  Her insurance denied Saxenda due to her weight had increased slightly  She is encouraged to continue trying to increase her physical activity to at least 30 minutes a day 5 days a week and to continue with a healthy diet. - Insulin, random(561)  2. Mood changes  trintellix is working good but may need to increase the dose  We will see how she is doing at her next visit  3. Left flank pain  Urinalysis is normal  Encouraged to stretch her hip flexors  - POCT Urinalysis Dipstick (81002)  4. Elevated blood-pressure reading without diagnosis of hypertension  Repeat blood pressure remains elevated but a little better  She is to take magnesium 250 mg with evening meal and return next week for a nurse visit blood pressure check  Encouraged to avoid high salt foods.       Patient was given opportunity to ask questions. Patient verbalized understanding of the plan and was able to repeat key elements of the plan. All questions were answered to their satisfaction.  [name], FNP   I, [name], FNP, have reviewed all documentation for this visit. The documentation on 10/22/19 for the exam, diagnosis, procedures, and orders are all accurate and complete.   THE PATIENT IS ENCOURAGED TO PRACTICE SOCIAL DISTANCING DUE TO THE COVID-19 PANDEMIC.

## 2019-10-22 NOTE — Patient Instructions (Signed)
Hip Exercises Ask your health care provider which exercises are safe for you. Do exercises exactly as told by your health care provider and adjust them as directed. It is normal to feel mild stretching, pulling, tightness, or discomfort as you do these exercises. Stop right away if you feel sudden pain or your pain gets worse. Do not begin these exercises until told by your health care provider. Stretching and range-of-motion exercises These exercises warm up your muscles and joints and improve the movement and flexibility of your hip. These exercises also help to relieve pain, numbness, and tingling. You may be asked to limit your range of motion if you had a hip replacement. Talk to your health care provider about these restrictions. Hamstrings, supine  1. Lie on your back (supine position). 2. Loop a belt or towel over the ball of your left / right foot. The ball of your foot is on the walking surface, right under your toes. 3. Straighten your left / right knee and slowly pull on the belt or towel to raise your leg until you feel a gentle stretch behind your knee (hamstring). ? Do not let your knee bend while you do this. ? Keep your other leg flat on the floor. 4. Hold this position for __________ seconds. 5. Slowly return your leg to the starting position. Repeat __________ times. Complete this exercise __________ times a day. Hip rotation  1. Lie on your back on a firm surface. 2. With your left / right hand, gently pull your left / right knee toward the shoulder that is on the same side of the body. Stop when your knee is pointing toward the ceiling. 3. Hold your left / right ankle with your other hand. 4. Keeping your knee steady, gently pull your left / right ankle toward your other shoulder until you feel a stretch in your buttocks. ? Keep your hips and shoulders firmly planted while you do this stretch. 5. Hold this position for __________ seconds. Repeat __________ times. Complete  this exercise __________ times a day. Seated stretch This exercise is sometimes called hamstrings and adductors stretch. 1. Sit on the floor with your legs stretched wide. Keep your knees straight during this exercise. 2. Keeping your head and back in a straight line, bend at your waist to reach for your left foot (position A). You should feel a stretch in your right inner thigh (adductors). 3. Hold this position for __________ seconds. Then slowly return to the upright position. 4. Keeping your head and back in a straight line, bend at your waist to reach forward (position B). You should feel a stretch behind both of your thighs and knees (hamstrings). 5. Hold this position for __________ seconds. Then slowly return to the upright position. 6. Keeping your head and back in a straight line, bend at your waist to reach for your right foot (position C). You should feel a stretch in your left inner thigh (adductors). 7. Hold this position for __________ seconds. Then slowly return to the upright position. Repeat __________ times. Complete this exercise __________ times a day. Lunge This exercise stretches the muscles of the hip (hip flexors). 1. Place your left / right knee on the floor and bend your other knee so that is directly over your ankle. You should be half-kneeling. 2. Keep good posture with your head over your shoulders. 3. Tighten your buttocks to point your tailbone downward. This will prevent your back from arching too much. 4. You should feel a   gentle stretch in the front of your left / right thigh and hip. If you do not feel a stretch, slide your other foot forward slightly and then slowly lunge forward with your chest up until your knee once again lines up over your ankle. ? Make sure your tailbone continues to point downward. 5. Hold this position for __________ seconds. 6. Slowly return to the starting position. Repeat __________ times. Complete this exercise __________ times a  day. Strengthening exercises These exercises build strength and endurance in your hip. Endurance is the ability to use your muscles for a long time, even after they get tired. Bridge This exercise strengthens the muscles of your hip (hip extensors). 1. Lie on your back on a firm surface with your knees bent and your feet flat on the floor. 2. Tighten your buttocks muscles and lift your bottom off the floor until the trunk of your body and your hips are level with your thighs. ? Do not arch your back. ? You should feel the muscles working in your buttocks and the back of your thighs. If you do not feel these muscles, slide your feet 1-2 inches (2.5-5 cm) farther away from your buttocks. 3. Hold this position for __________ seconds. 4. Slowly lower your hips to the starting position. 5. Let your muscles relax completely between repetitions. Repeat __________ times. Complete this exercise __________ times a day. Straight leg raises, side-lying This exercise strengthens the muscles that move the hip joint away from the center of the body (hip abductors). 1. Lie on your side with your left / right leg in the top position. Lie so your head, shoulder, hip, and knee line up. You may bend your bottom knee slightly to help you balance. 2. Roll your hips slightly forward, so your hips are stacked directly over each other and your left / right knee is facing forward. 3. Leading with your heel, lift your top leg 4-6 inches (10-15 cm). You should feel the muscles in your top hip lifting. ? Do not let your foot drift forward. ? Do not let your knee roll toward the ceiling. 4. Hold this position for __________ seconds. 5. Slowly return to the starting position. 6. Let your muscles relax completely between repetitions. Repeat __________ times. Complete this exercise __________ times a day. Straight leg raises, side-lying This exercise strengthens the muscles that move the hip joint toward the center of the  body (hip adductors). 1. Lie on your side with your left / right leg in the bottom position. Lie so your head, shoulder, hip, and knee line up. You may place your upper foot in front to help you balance. 2. Roll your hips slightly forward, so your hips are stacked directly over each other and your left / right knee is facing forward. 3. Tense the muscles in your inner thigh and lift your bottom leg 4-6 inches (10-15 cm). 4. Hold this position for __________ seconds. 5. Slowly return to the starting position. 6. Let your muscles relax completely between repetitions. Repeat __________ times. Complete this exercise __________ times a day. Straight leg raises, supine This exercise strengthens the muscles in the front of your thigh (quadriceps). 1. Lie on your back (supine position) with your left / right leg extended and your other knee bent. 2. Tense the muscles in the front of your left / right thigh. You should see your kneecap slide up or see increased dimpling just above your knee. 3. Keep these muscles tight as you raise your   leg 4-6 inches (10-15 cm) off the floor. Do not let your knee bend. 4. Hold this position for __________ seconds. 5. Keep these muscles tense as you lower your leg. 6. Relax the muscles slowly and completely between repetitions. Repeat __________ times. Complete this exercise __________ times a day. Hip abductors, standing This exercise strengthens the muscles that move the leg and hip joint away from the center of the body (hip abductors). 1. Tie one end of a rubber exercise band or tubing to a secure surface, such as a chair, table, or pole. 2. Loop the other end of the band or tubing around your left / right ankle. 3. Keeping your ankle with the band or tubing directly opposite the secured end, step away until there is tension in the tubing or band. Hold on to a chair, table, or pole as needed for balance. 4. Lift your left / right leg out to your side. While you do  this: ? Keep your back upright. ? Keep your shoulders over your hips. ? Keep your toes pointing forward. ? Make sure to use your hip muscles to slowly lift your leg. Do not tip your body or forcefully lift your leg. 5. Hold this position for __________ seconds. 6. Slowly return to the starting position. Repeat __________ times. Complete this exercise __________ times a day. Squats This exercise strengthens the muscles in the front of your thigh (quadriceps). 1. Stand in a door frame so your feet and knees are in line with the frame. You may place your hands on the frame for balance. 2. Slowly bend your knees and lower your hips like you are going to sit in a chair. ? Keep your lower legs in a straight-up-and-down position. ? Do not let your hips go lower than your knees. ? Do not bend your knees lower than told by your health care provider. ? If your hip pain increases, do not bend as low. 3. Hold this position for ___________ seconds. 4. Slowly push with your legs to return to standing. Do not use your hands to pull yourself to standing. Repeat __________ times. Complete this exercise __________ times a day. This information is not intended to replace advice given to you by your health care provider. Make sure you discuss any questions you have with your health care provider. Document Revised: 09/04/2018 Document Reviewed: 12/10/2017 Elsevier Patient Education  2020 [employer]  

## 2019-10-23 LAB — INSULIN, RANDOM: INSULIN: 15 u[IU]/mL (ref 2.6–24.9)

## 2019-10-26 ENCOUNTER — Ambulatory Visit (INDEPENDENT_AMBULATORY_CARE_PROVIDER_SITE_OTHER): Payer: BC Managed Care – PPO | Admitting: Psychology

## 2019-10-26 DIAGNOSIS — F418 Other specified anxiety disorders: Secondary | ICD-10-CM | POA: Diagnosis not present

## 2019-10-26 DIAGNOSIS — F3289 Other specified depressive episodes: Secondary | ICD-10-CM

## 2019-10-27 ENCOUNTER — Ambulatory Visit: Payer: BC Managed Care – PPO

## 2019-10-27 ENCOUNTER — Other Ambulatory Visit: Payer: Self-pay

## 2019-10-27 VITALS — BP 140/80 | HR 77 | Temp 98.0°F | Ht 66.4 in | Wt 276.0 lb

## 2019-10-27 DIAGNOSIS — R030 Elevated blood-pressure reading, without diagnosis of hypertension: Secondary | ICD-10-CM

## 2019-10-27 NOTE — Progress Notes (Signed)
Pt is here today for a b/p check she is currently not taking any b/p medications her b/p today 140/80  Per JM: Okay this is better see if she is taking magnesium?   she has started taking it did you want to start her on something?  Per JM: no continue with the magnesium and see when her next appt is, if not in 4-6 weeks schedule a visit with me

## 2019-11-18 ENCOUNTER — Ambulatory Visit (INDEPENDENT_AMBULATORY_CARE_PROVIDER_SITE_OTHER): Payer: BC Managed Care – PPO | Admitting: Psychology

## 2019-11-18 DIAGNOSIS — F3289 Other specified depressive episodes: Secondary | ICD-10-CM | POA: Diagnosis not present

## 2019-11-18 DIAGNOSIS — F418 Other specified anxiety disorders: Secondary | ICD-10-CM

## 2019-11-23 ENCOUNTER — Ambulatory Visit: Payer: Self-pay | Admitting: Nurse Practitioner

## 2019-12-16 ENCOUNTER — Ambulatory Visit (INDEPENDENT_AMBULATORY_CARE_PROVIDER_SITE_OTHER): Payer: BC Managed Care – PPO | Admitting: Psychology

## 2019-12-16 DIAGNOSIS — F418 Other specified anxiety disorders: Secondary | ICD-10-CM | POA: Diagnosis not present

## 2019-12-16 DIAGNOSIS — F3289 Other specified depressive episodes: Secondary | ICD-10-CM | POA: Diagnosis not present

## 2019-12-24 ENCOUNTER — Encounter: Payer: Self-pay | Admitting: Nurse Practitioner

## 2019-12-24 ENCOUNTER — Other Ambulatory Visit: Payer: Self-pay

## 2019-12-24 ENCOUNTER — Ambulatory Visit: Payer: BC Managed Care – PPO | Admitting: Nurse Practitioner

## 2019-12-24 VITALS — BP 128/84 | HR 76 | Temp 98.3°F | Ht 67.2 in | Wt 271.6 lb

## 2019-12-24 DIAGNOSIS — R4586 Emotional lability: Secondary | ICD-10-CM | POA: Diagnosis not present

## 2019-12-24 DIAGNOSIS — R1032 Left lower quadrant pain: Secondary | ICD-10-CM | POA: Diagnosis not present

## 2019-12-24 DIAGNOSIS — Z6841 Body Mass Index (BMI) 40.0 and over, adult: Secondary | ICD-10-CM

## 2019-12-24 DIAGNOSIS — R109 Unspecified abdominal pain: Secondary | ICD-10-CM

## 2019-12-24 LAB — POCT URINALYSIS DIPSTICK
Bilirubin, UA: NEGATIVE
Blood, UA: NEGATIVE
Glucose, UA: NEGATIVE
Leukocytes, UA: NEGATIVE
Nitrite, UA: NEGATIVE
Protein, UA: NEGATIVE
Spec Grav, UA: >=1.03 — AB (ref 1.010–1.025)
Urobilinogen, UA: 0.2 E.U./dL
pH, UA: 5.5 (ref 5.0–8.0)

## 2019-12-24 NOTE — Patient Instructions (Signed)

## 2019-12-24 NOTE — Progress Notes (Signed)
I,Yamilka Roman [employer] as a Neurosurgeon for [employer], FNP.,have documented all relevant documentation on the behalf of [name], FNP,as directed by  [name], FNP while in the presence of [name], FNP. This visit occurred during the SARS-CoV-2 public health emergency.  Safety protocols were in place, including screening questions prior to the visit, additional usage of staff PPE, and extensive cleaning of exam room while observing appropriate contact time as indicated for disinfecting solutions.  Subjective:     Patient ID: Patricia Boyer , female    DOB: [birth year] , 39 y.o.   MRN: [id number]   Chief Complaint  Patient presents with  . Weight Check  . Flank Pain    HPI  Patient here for a weight check.  Currently taking Saxenda - she is at the maximum dose, she is noticing she is not eating a whole sandwich.  She is using [employer] twice a day, she is eating her heavier meal at work.    Wt Readings from Last 3 Encounters: 12/24/19 : 271 lb 9.6 oz (123.2 kg) 10/27/19 : 276 lb (125.2 kg) 10/22/19 : 275 lb (124.7 kg)    She is taking trintellix now and feels like 3 weeks ago began having "vivid dreams/weird thoughts".  She describes the thoughts are being "odd".  Did you check to see if a squirrel in the trash is an example.  She is also in a more high stress job, she does not feel the trintellix is helping with her mood.  She is going to counseling - next appt is next week.  Denies suicidal or homicidal thoughts.      She continues to have left groin/low abdomen pain after sitting for long periods. Unable to illicit pain with palpation mostly after sitting for long periods. Currently her pain is 2/10 when she stands rates 10/10 lasting for approximately 20-30 minutes.  She has taken aleve for her pain.  Will hurt each time she sits for a long time.  Denies previous problems with her left hip.      Past Medical History:  Diagnosis Date  . Anemia   . Arthritis    . Cataract   . GERD (gastroesophageal reflux disease)      Family History  Problem Relation Age of Onset  . Hypertension Mother      Current Outpatient Medications:  .  Insulin Pen Needle 31G X 8 MM MISC, 1 each by Does not apply route daily., Disp: 90 each, Rfl: 1 .  Liraglutide -Weight Management (SAXENDA) 18 MG/3ML SOPN, Inject 0.5 mLs (3 mg total) into the skin daily., Disp: 15 mL, Rfl: 1  Current Facility-Administered Medications:  .  Insulin Pen Needle (NOVOFINE) 40 each, 4 packet, Subcutaneous, PRN, [name], FNP .  Insulin Pen Needle (NOVOFINE) 40 each, 4 packet, Subcutaneous, PRN, [name], FNP   No Known Allergies   Review of Systems  Constitutional: Negative.   Respiratory: Negative.  Negative for cough.   Cardiovascular: Negative.  Negative for chest pain, palpitations and leg swelling.    Today's Vitals   12/24/19 1142  BP: 128/84  Pulse: 76  Temp: 98.3 F (36.8 C)  Weight: 271 lb 9.6 oz (123.2 kg)  Height: 5' 7.2" (1.707 m)  PainSc: 0-No pain   Body mass index is 42.29 kg/m.   Objective:  Physical Exam Vitals reviewed.  Constitutional:      General: She is not in acute distress.    Appearance: Normal appearance. She is obese.  Neurological:     General: No focal deficit present.     Mental Status: She is alert and oriented to person, place, and time.     Cranial Nerves: No cranial nerve deficit.  Psychiatric:        Mood and Affect: Mood normal.        Behavior: Behavior normal.        Thought Content: Thought content normal.        Judgment: Judgment normal.         Assessment And Plan:     1. Class 3 severe obesity due to excess calories without serious comorbidity with body mass index (BMI) of 40.0 to 44.9 in adult [facility])  Chronic, she has lost 5 lbs since her last visit  She is tolerating saxenda well and we are awaiting PA  Wt Readings from Last 3 Encounters:  12/24/19 271 lb 9.6 oz (123.2 kg)  10/27/19 276 lb (125.2  kg)  10/22/19 275 lb (124.7 kg)     2. Flank pain  Urinalysis is normal  3. Left groin pain  Will check pelvic and transvaginal ultrasound to check for structural damage  Pain is worse with movement - POCT Urinalysis Dipstick (14970) - US Pelvic Complete With Transvaginal; Future  4. Mood changes  She does not want to see psychiatry at this time would like to think about it. She is to stop the trintellix due to the thoughts she is having   Patient was given opportunity to ask questions. Patient verbalized understanding of the plan and was able to repeat key elements of the plan. All questions were answered to their satisfaction.    Jeanell Sparrow, FNP, have reviewed all documentation for this visit. The documentation on 01/12/20 for the exam, diagnosis, procedures, and orders are all accurate and complete.  THE PATIENT IS ENCOURAGED TO PRACTICE SOCIAL DISTANCING DUE TO THE COVID-19 PANDEMIC.

## 2020-01-05 ENCOUNTER — Ambulatory Visit
Admission: RE | Admit: 2020-01-05 | Discharge: 2020-01-05 | Disposition: A | Discharge status: Home / Self Care | Payer: BC Managed Care – PPO | Source: Ambulatory Visit | Attending: Nurse Practitioner | Admitting: Nurse Practitioner

## 2020-01-05 DIAGNOSIS — R1032 Left lower quadrant pain: Secondary | ICD-10-CM

## 2020-01-10 ENCOUNTER — Other Ambulatory Visit: Payer: Self-pay | Admitting: Nurse Practitioner

## 2020-01-10 DIAGNOSIS — R1032 Left lower quadrant pain: Secondary | ICD-10-CM

## 2020-01-10 NOTE — Progress Notes (Signed)
Referral has been placed. 

## 2020-01-13 ENCOUNTER — Ambulatory Visit: Payer: BC Managed Care – PPO | Admitting: Psychology

## 2020-01-28 ENCOUNTER — Other Ambulatory Visit: Payer: Self-pay | Admitting: Nurse Practitioner

## 2020-01-28 DIAGNOSIS — Z6841 Body Mass Index (BMI) 40.0 and over, adult: Secondary | ICD-10-CM

## 2020-02-16 ENCOUNTER — Ambulatory Visit (INDEPENDENT_AMBULATORY_CARE_PROVIDER_SITE_OTHER): Payer: BC Managed Care – PPO | Admitting: Psychology

## 2020-02-16 DIAGNOSIS — F418 Other specified anxiety disorders: Secondary | ICD-10-CM | POA: Diagnosis not present

## 2020-02-16 DIAGNOSIS — F3289 Other specified depressive episodes: Secondary | ICD-10-CM | POA: Diagnosis not present

## 2020-02-23 ENCOUNTER — Ambulatory Visit: Payer: BC Managed Care – PPO | Admitting: Nurse Practitioner

## 2020-03-07 ENCOUNTER — Telehealth: Payer: Self-pay

## 2020-03-07 NOTE — Telephone Encounter (Signed)
PA for Bernie Covey has been approved [id number]

## 2020-03-08 ENCOUNTER — Ambulatory Visit (INDEPENDENT_AMBULATORY_CARE_PROVIDER_SITE_OTHER): Payer: BC Managed Care – PPO | Admitting: Psychology

## 2020-03-08 DIAGNOSIS — F418 Other specified anxiety disorders: Secondary | ICD-10-CM | POA: Diagnosis not present

## 2020-03-08 DIAGNOSIS — F3289 Other specified depressive episodes: Secondary | ICD-10-CM | POA: Diagnosis not present

## 2020-03-29 ENCOUNTER — Ambulatory Visit (INDEPENDENT_AMBULATORY_CARE_PROVIDER_SITE_OTHER): Payer: BC Managed Care – PPO | Admitting: Psychology

## 2020-03-29 DIAGNOSIS — F418 Other specified anxiety disorders: Secondary | ICD-10-CM

## 2020-03-29 DIAGNOSIS — F3289 Other specified depressive episodes: Secondary | ICD-10-CM

## 2020-04-19 ENCOUNTER — Ambulatory Visit (INDEPENDENT_AMBULATORY_CARE_PROVIDER_SITE_OTHER): Payer: BC Managed Care – PPO | Admitting: Psychology

## 2020-04-19 DIAGNOSIS — F3289 Other specified depressive episodes: Secondary | ICD-10-CM

## 2020-04-19 DIAGNOSIS — F418 Other specified anxiety disorders: Secondary | ICD-10-CM

## 2020-05-17 ENCOUNTER — Ambulatory Visit (INDEPENDENT_AMBULATORY_CARE_PROVIDER_SITE_OTHER): Payer: BC Managed Care – PPO | Admitting: Psychology

## 2020-05-17 DIAGNOSIS — F3289 Other specified depressive episodes: Secondary | ICD-10-CM

## 2020-05-17 DIAGNOSIS — F418 Other specified anxiety disorders: Secondary | ICD-10-CM

## 2020-05-26 ENCOUNTER — Ambulatory Visit: Payer: BC Managed Care – PPO | Admitting: Nurse Practitioner

## 2020-05-26 ENCOUNTER — Other Ambulatory Visit: Payer: Self-pay

## 2020-05-26 ENCOUNTER — Encounter: Payer: Self-pay | Admitting: Nurse Practitioner

## 2020-05-26 VITALS — BP 132/98 | HR 68 | Temp 98.2°F | Ht 67.2 in | Wt 282.0 lb

## 2020-05-26 DIAGNOSIS — Z6841 Body Mass Index (BMI) 40.0 and over, adult: Secondary | ICD-10-CM

## 2020-05-26 DIAGNOSIS — F419 Anxiety disorder, unspecified: Secondary | ICD-10-CM

## 2020-05-26 MED ORDER — BUSPIRONE HCL 5 MG PO TABS: 5.0000 mg | ORAL_TABLET | Freq: Every day | ORAL | 2 refills | Status: DC

## 2020-05-26 NOTE — Progress Notes (Signed)
I,Yamilka Roman [employer] as a Neurosurgeon for [employer], FNP.,have documented all relevant documentation on the behalf of [name], FNP,as directed by  [name], FNP while in the presence of [name], FNP. This visit occurred during the SARS-CoV-2 public health emergency.  Safety protocols were in place, including screening questions prior to the visit, additional usage of staff PPE, and extensive cleaning of exam room while observing appropriate contact time as indicated for disinfecting solutions.  Subjective:     Patient ID: Patricia Boyer , female    DOB: September 25, 1980 , 40 y.o.   MRN: [id number]   Chief Complaint  Patient presents with  . Weight Check    HPI  Patient presents today for a weight check. Patient is no longer taking saxenda because she ran out of it. Patient stated she missed her appointment in January due to having to go to work so her medicine was not refilled. She does feel the saxenda was working for her when she was on it. She is in the process of looking for another job. She is no longer cooking at home since working long hours. 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 Therapy has helped with her dealing with being upset. Does not linger as long.  When she was taking Saxenda she would not complete her meals. She does feel like she is a stress eater.  Wt Readings from Last 3 Encounters: 05/26/20 : 282 (127.9 kg) 12/24/19 : 271 lb 9.6 oz (123.2 kg) 10/27/19 : 276 lb lb (125.2 kg)      Past Medical History:  Diagnosis Date  . Anemia   . Arthritis   . Cataract   . GERD (gastroesophageal reflux disease)      Family History  Problem Relation Age of Onset  . Hypertension Mother      Current Outpatient Medications:  .  busPIRone (BUSPAR) 5 MG tablet, Take 1 tablet (5 mg total) by mouth daily., Disp: 30 tablet, Rfl: 2 .  Insulin Pen Needle 31G X 8 MM MISC, 1 each by Does not apply route daily. (Patient not taking: Reported on  05/26/2020), Disp: 90 each, Rfl: 1 .  SAXENDA 18 MG/3ML SOPN, INJECT 0.5 MLS (3 MG TOTAL) INTO THE SKIN DAILY. (Patient not taking: Reported on 05/26/2020), Disp: 9 mL, Rfl: 3  Current Facility-Administered Medications:  .  Insulin Pen Needle (NOVOFINE) 40 each, 4 packet, Subcutaneous, PRN, [name], FNP .  Insulin Pen Needle (NOVOFINE) 40 each, 4 packet, Subcutaneous, PRN, [name], FNP   No Known Allergies   Review of Systems  Constitutional: Negative.   Respiratory: Negative.  Negative for cough.   Cardiovascular: Negative.  Negative for chest pain, palpitations and leg swelling.  Neurological: Negative for dizziness and headaches.  Psychiatric/Behavioral: Negative.      Today's Vitals   05/26/20 1501  BP: (!) 132/98  Pulse: 68  Temp: 98.2 F (36.8 C)  TempSrc: Oral  Weight: 282 lb (127.9 kg)  Height: 5' 7.2" (1.707 m)  PainSc: 0-No pain   Body mass index is 43.9 kg/m.   Objective:  Physical Exam Vitals reviewed.  Constitutional:      General: She is not in acute distress.    Appearance: Normal appearance. She is obese.  Cardiovascular:     Rate and Rhythm: Normal rate and regular rhythm.     Pulses: Normal pulses.     Heart sounds: Normal heart sounds. No murmur heard.   Pulmonary:     Effort:  Pulmonary effort is normal. No respiratory distress.     Breath sounds: Normal breath sounds. No wheezing.  Skin:    Capillary Refill: Capillary refill takes less than 2 seconds.  Neurological:     General: No focal deficit present.     Mental Status: She is alert and oriented to person, place, and time.     Cranial Nerves: No cranial nerve deficit.  Psychiatric:        Mood and Affect: Mood normal.        Behavior: Behavior normal.        Thought Content: Thought content normal.        Judgment: Judgment normal.         Assessment And Plan:     1. Class 3 severe obesity due to excess calories without serious comorbidity with body mass index (BMI) of  40.0 to 44.9 in adult [facility]) Chronic,  Wt Readings from Last 3 Encounters:  05/26/20 282 lb (127.9 kg)  12/24/19 271 lb 9.6 oz (123.2 kg)  10/27/19 276 lb (125.2 kg)   Unfortunately her weight has increased has not been taking Saxenda, will refill pending insurane coverage She is encouraged to increase physical activity  2. Anxiety  She has been having more anxiety, will try her on buspirone  Discussed side effects  She is to follow up in 4-6 weeks - busPIRone (BUSPAR) 5 MG tablet; Take 1 tablet (5 mg total) by mouth daily.  Dispense: 30 tablet; Refill: 2     Patient was given opportunity to ask questions. Patient verbalized understanding of the plan and was able to repeat key elements of the plan. All questions were answered to their satisfaction.  [name], FNP   I, [name], FNP, have reviewed all documentation for this visit. The documentation on 05/26/20 for the exam, diagnosis, procedures, and orders are all accurate and complete.   IF YOU HAVE BEEN REFERRED TO A SPECIALIST, IT MAY TAKE 1-2 WEEKS TO SCHEDULE/PROCESS THE REFERRAL. IF YOU HAVE NOT HEARD FROM US/SPECIALIST IN TWO WEEKS, PLEASE GIVE [place] A CALL AT [phone].   THE PATIENT IS ENCOURAGED TO PRACTICE SOCIAL DISTANCING DUE TO THE COVID-19 PANDEMIC.

## 2020-05-26 NOTE — Patient Instructions (Signed)

## 2020-06-07 ENCOUNTER — Ambulatory Visit (INDEPENDENT_AMBULATORY_CARE_PROVIDER_SITE_OTHER): Payer: BC Managed Care – PPO | Admitting: Psychology

## 2020-06-07 DIAGNOSIS — F418 Other specified anxiety disorders: Secondary | ICD-10-CM | POA: Diagnosis not present

## 2020-06-07 DIAGNOSIS — F3289 Other specified depressive episodes: Secondary | ICD-10-CM | POA: Diagnosis not present

## 2020-06-11 MED ORDER — SAXENDA 18 MG/3ML ~~LOC~~ SOPN: 3.0000 mg | PEN_INJECTOR | Freq: Every day | SUBCUTANEOUS | 3 refills | Status: DC

## 2020-06-18 ENCOUNTER — Other Ambulatory Visit: Payer: Self-pay | Admitting: Nurse Practitioner

## 2020-06-18 DIAGNOSIS — F419 Anxiety disorder, unspecified: Secondary | ICD-10-CM

## 2020-06-29 ENCOUNTER — Ambulatory Visit (INDEPENDENT_AMBULATORY_CARE_PROVIDER_SITE_OTHER): Payer: BC Managed Care – PPO | Admitting: Psychology

## 2020-06-29 DIAGNOSIS — F3289 Other specified depressive episodes: Secondary | ICD-10-CM

## 2020-06-29 DIAGNOSIS — F418 Other specified anxiety disorders: Secondary | ICD-10-CM | POA: Diagnosis not present

## 2020-06-30 ENCOUNTER — Telehealth (INDEPENDENT_AMBULATORY_CARE_PROVIDER_SITE_OTHER): Payer: BC Managed Care – PPO | Admitting: Nurse Practitioner

## 2020-06-30 ENCOUNTER — Encounter: Payer: Self-pay | Admitting: Nurse Practitioner

## 2020-06-30 VITALS — Ht 68.0 in | Wt 280.0 lb

## 2020-06-30 DIAGNOSIS — F419 Anxiety disorder, unspecified: Secondary | ICD-10-CM | POA: Diagnosis not present

## 2020-06-30 DIAGNOSIS — Z6841 Body Mass Index (BMI) 40.0 and over, adult: Secondary | ICD-10-CM | POA: Diagnosis not present

## 2020-06-30 MED ORDER — QSYMIA 3.75-23 MG PO CP24: 1.0000 | ORAL_CAPSULE | Freq: Every day | ORAL | 0 refills | Status: DC

## 2020-06-30 MED ORDER — QSYMIA 7.5-46 MG PO CP24: 1.0000 | ORAL_CAPSULE | Freq: Every day | ORAL | 1 refills | Status: DC

## 2020-06-30 NOTE — Progress Notes (Signed)
Virtual Visit via MyChart   This visit type was conducted due to national recommendations for restrictions regarding the COVID-19 Pandemic (e.g. social distancing) in an effort to limit this patient's exposure and mitigate transmission in our community.  Due to her co-morbid illnesses, this patient is at least at moderate risk for complications without adequate follow up.  This format is felt to be most appropriate for this patient at this time.  All issues noted in this document were discussed and addressed.  A limited physical exam was performed with this format.    This visit type was conducted due to national recommendations for restrictions regarding the COVID-19 Pandemic (e.g. social distancing) in an effort to limit this patient's exposure and mitigate transmission in our community.  Patients identity confirmed using two different identifiers.  This format is felt to be most appropriate for this patient at this time.  All issues noted in this document were discussed and addressed.  No physical exam was performed (except for noted visual exam findings with Video Visits).    Date:  08/02/2020   ID:  Patricia Boyer, DOB 09-20-1980, MRN [id number]  Patient Location:  Car parked - spoke with Larene Beach  Provider location:   Office    Chief Complaint:  follow up new medications (buspar)  History of Present Illness:    Patricia Boyer is a 40 y.o. female who presents via video conferencing for a telehealth visit today.    The patient does not have symptoms concerning for COVID-19 infection (fever, chills, cough, or new shortness of breath).   She has been able to meet deadlines and she feels more calm. Did not feel everything   She takes in the morning with water. Denies any side effects.  A couple times she felt she may need an additional dose.  She is sleeping okay as well. She will sometimes have long days.  She is not stress eating as much.      Past Medical History:   Diagnosis Date   Anemia    Arthritis    Cataract    GERD (gastroesophageal reflux disease)    No past surgical history on file.   Current Meds  Medication Sig   busPIRone (BUSPAR) 5 MG tablet TAKE 1 TABLET (5 MG TOTAL) BY MOUTH DAILY.   Phentermine-Topiramate (QSYMIA) 3.75-23 MG CP24 Take 1 tablet by mouth daily.   Phentermine-Topiramate (QSYMIA) 7.5-46 MG CP24 Take 1 tablet by mouth daily.   Current Facility-Administered Medications for the 06/30/20 encounter (Video Visit) with [name], FNP  Medication   Insulin Pen Needle (NOVOFINE) 40 each   Insulin Pen Needle (NOVOFINE) 40 each     Allergies:   Patient has no known allergies.   Social History   Tobacco Use   Smoking status: Never   Smokeless tobacco: Never  Substance Use Topics   Alcohol use: Yes    Alcohol/week: 1.0 standard drink    Types: 1 Cans of beer per week   Drug use: No     Family Hx: The patient's family history includes Hypertension in her mother.  ROS:   Please see the history of present illness.    Review of Systems  Constitutional: Negative.   Cardiovascular: Negative.   Musculoskeletal: Negative.   Neurological:  Negative for dizziness and tingling.  Psychiatric/Behavioral: Negative.     All other systems reviewed and are negative.   Labs/Other Tests and Data Reviewed:    Recent Labs: No results found for requested  labs within last 8760 hours.   Recent Lipid Panel Lab Results  Component Value Date/Time   CHOL 173 05/21/2019 03:39 PM   TRIG 53 05/21/2019 03:39 PM   HDL 80 05/21/2019 03:39 PM   CHOLHDL 2.2 05/21/2019 03:39 PM   LDLCALC 82 05/21/2019 03:39 PM    Wt Readings from Last 3 Encounters:  06/30/20 280 lb (127 kg)  05/26/20 282 lb (127.9 kg)  12/24/19 271 lb 9.6 oz (123.2 kg)     Exam:    Vital Signs:  Ht 5\' 8"  (1.727 m)   Wt 280 lb (127 kg)   LMP 06/21/2020   BMI 42.57 kg/m     Physical Exam Constitutional:      General: She is not in acute distress.     Appearance: Normal appearance. She is obese.  Pulmonary:     Effort: Pulmonary effort is normal. No respiratory distress.  Neurological:     General: No focal deficit present.     Mental Status: She is alert and oriented to person, place, and time.     Cranial Nerves: No cranial nerve deficit.  Psychiatric:        Mood and Affect: Mood normal.        Behavior: Behavior normal.        Thought Content: Thought content normal.        Judgment: Judgment normal.    ASSESSMENT & PLAN:    1. Anxiety She is doing well with buspar and able to meet her deadlines Continue with current dose  2. Class 3 severe obesity due to excess calories without serious comorbidity with body mass index (BMI) of 40.0 to 44.9 in adult [facility]) Chronic Discussed healthy diet and regular exercise options  Encouraged to exercise at least 150 minutes per week with 2 days of strength training Will start Qsymia pending insurance approval if not approved will be able to get with specialty pharmacy qsymia education given   Return in 2 months for weight check. - Phentermine-Topiramate (QSYMIA) 3.75-23 MG CP24; Take 1 tablet by mouth daily.  Dispense: 14 capsule; Refill: 0 - Phentermine-Topiramate (QSYMIA) 7.5-46 MG CP24; Take 1 tablet by mouth daily.  Dispense: 30 capsule; Refill: 1   COVID-19 Education: The signs and symptoms of COVID-19 were discussed with the patient and how to seek care for testing (follow up with PCP or arrange E-visit).  The importance of social distancing was discussed today.  Patient Risk:   After full review of this patients clinical status, I feel that they are at least moderate risk at this time.  Time:   Today, I have spent 10 minutes/ seconds with the patient with telehealth technology discussing above diagnoses.     Medication Adjustments/Labs and Tests Ordered: Current medicines are reviewed at length with the patient today.  Concerns regarding medicines are outlined above.   Tests  Ordered: No orders of the defined types were placed in this encounter.   Medication Changes: Meds ordered this encounter  Medications   Phentermine-Topiramate (QSYMIA) 3.75-23 MG CP24    Sig: Take 1 tablet by mouth daily.    Dispense:  14 capsule    Refill:  0   Phentermine-Topiramate (QSYMIA) 7.5-46 MG CP24    Sig: Take 1 tablet by mouth daily.    Dispense:  30 capsule    Refill:  1    Disposition:  Follow up in 2 month(s)  Signed, 03-21-1981, FNP

## 2020-07-19 ENCOUNTER — Ambulatory Visit (INDEPENDENT_AMBULATORY_CARE_PROVIDER_SITE_OTHER): Payer: BC Managed Care – PPO | Admitting: Psychology

## 2020-07-19 DIAGNOSIS — F3289 Other specified depressive episodes: Secondary | ICD-10-CM | POA: Diagnosis not present

## 2020-07-19 DIAGNOSIS — F418 Other specified anxiety disorders: Secondary | ICD-10-CM

## 2020-08-02 ENCOUNTER — Encounter: Payer: Self-pay | Admitting: Nurse Practitioner

## 2020-08-10 ENCOUNTER — Encounter: Payer: Self-pay | Admitting: Nurse Practitioner

## 2020-08-10 ENCOUNTER — Ambulatory Visit (INDEPENDENT_AMBULATORY_CARE_PROVIDER_SITE_OTHER): Payer: BC Managed Care – PPO | Admitting: Nurse Practitioner

## 2020-08-10 ENCOUNTER — Other Ambulatory Visit: Payer: Self-pay

## 2020-08-10 VITALS — BP 124/80 | HR 80 | Temp 97.9°F | Ht 65.8 in | Wt 282.8 lb

## 2020-08-10 DIAGNOSIS — Z6841 Body Mass Index (BMI) 40.0 and over, adult: Secondary | ICD-10-CM

## 2020-08-10 DIAGNOSIS — Z1159 Encounter for screening for other viral diseases: Secondary | ICD-10-CM | POA: Diagnosis not present

## 2020-08-10 DIAGNOSIS — Z0000 Encounter for general adult medical examination without abnormal findings: Secondary | ICD-10-CM

## 2020-08-10 DIAGNOSIS — Z566 Other physical and mental strain related to work: Secondary | ICD-10-CM | POA: Diagnosis not present

## 2020-08-10 DIAGNOSIS — L659 Nonscarring hair loss, unspecified: Secondary | ICD-10-CM | POA: Diagnosis not present

## 2020-08-10 NOTE — Patient Instructions (Signed)
Health Maintenance, Female Adopting a healthy lifestyle and getting preventive care are important in promoting health and wellness. Ask your health care provider about: The right schedule for you to have regular tests and exams. Things you can do on your own to prevent diseases and keep yourself healthy. What should I know about diet, weight, and exercise? Eat a healthy diet  Eat a diet that includes plenty of vegetables, fruits, low-fat dairy products, and lean protein. Do not eat a lot of foods that are high in solid fats, added sugars, or sodium.  Maintain a healthy weight Body mass index (BMI) is used to identify weight problems. It estimates body fat based on height and weight. Your health care provider can help determineyour BMI and help you achieve or maintain a healthy weight. Get regular exercise Get regular exercise. This is one of the most important things you can do for your health. Most adults should: Exercise for at least 150 minutes each week. The exercise should increase your heart rate and make you sweat (moderate-intensity exercise). Do strengthening exercises at least twice a week. This is in addition to the moderate-intensity exercise. Spend less time sitting. Even light physical activity can be beneficial. Watch cholesterol and blood lipids Have your blood tested for lipids and cholesterol at 39 years of age, then havethis test every 5 years. Have your cholesterol levels checked more often if: Your lipid or cholesterol levels are high. You are older than 40 years of age. You are at high risk for heart disease. What should I know about cancer screening? Depending on your health history and family history, you may need to have cancer screening at various ages. This may include screening for: Breast cancer. Cervical cancer. Colorectal cancer. Skin cancer. Lung cancer. What should I know about heart disease, diabetes, and high blood pressure? Blood pressure and heart  disease High blood pressure causes heart disease and increases the risk of stroke. This is more likely to develop in people who have high blood pressure readings, are of African descent, or are overweight. Have your blood pressure checked: Every 3-5 years if you are 18-39 years of age. Every year if you are 40 years old or older. Diabetes Have regular diabetes screenings. This checks your fasting blood sugar level. Have the screening done: Once every three years after age 40 if you are at a normal weight and have a low risk for diabetes. More often and at a younger age if you are overweight or have a high risk for diabetes. What should I know about preventing infection? Hepatitis B If you have a higher risk for hepatitis B, you should be screened for this virus. Talk with your health care provider to find out if you are at risk forhepatitis B infection. Hepatitis C Testing is recommended for: Everyone born from 1945 through 1965. Anyone with known risk factors for hepatitis C. Sexually transmitted infections (STIs) Get screened for STIs, including gonorrhea and chlamydia, if: You are sexually active and are younger than 39 years of age. You are older than 39 years of age and your health care provider tells you that you are at risk for this type of infection. Your sexual activity has changed since you were last screened, and you are at increased risk for chlamydia or gonorrhea. Ask your health care provider if you are at risk. Ask your health care provider about whether you are at high risk for HIV. Your health care provider may recommend a prescription medicine to help   prevent HIV infection. If you choose to take medicine to prevent HIV, you should first get tested for HIV. You should then be tested every 3 months for as long as you are taking the medicine. Pregnancy If you are about to stop having your period (premenopausal) and you may become pregnant, seek counseling before you get  pregnant. Take 400 to 800 micrograms (mcg) of folic acid every day if you become pregnant. Ask for birth control (contraception) if you want to prevent pregnancy. Osteoporosis and menopause Osteoporosis is a disease in which the bones lose minerals and strength with aging. This can result in bone fractures. If you are 65 years old or older, or if you are at risk for osteoporosis and fractures, ask your health care provider if you should: Be screened for bone loss. Take a calcium or vitamin D supplement to lower your risk of fractures. Be given hormone replacement therapy (HRT) to treat symptoms of menopause. Follow these instructions at home: Lifestyle Do not use any products that contain nicotine or tobacco, such as cigarettes, e-cigarettes, and chewing tobacco. If you need help quitting, ask your health care provider. Do not use street drugs. Do not share needles. Ask your health care provider for help if you need support or information about quitting drugs. Alcohol use Do not drink alcohol if: Your health care provider tells you not to drink. You are pregnant, may be pregnant, or are planning to become pregnant. If you drink alcohol: Limit how much you use to 0-1 drink a day. Limit intake if you are breastfeeding. Be aware of how much alcohol is in your drink. In the [place] one drink equals one 12 oz bottle of beer (355 mL), one 5 oz glass of wine (148 mL), or one 1 oz glass of hard liquor (44 mL). General instructions Schedule regular health, dental, and eye exams. Stay current with your vaccines. Tell your health care provider if: You often feel depressed. You have ever been abused or do not feel safe at home. Summary Adopting a healthy lifestyle and getting preventive care are important in promoting health and wellness. Follow your health care provider's instructions about healthy diet, exercising, and getting tested or screened for diseases. Follow your health care provider's  instructions on monitoring your cholesterol and blood pressure. This information is not intended to replace advice given to you by your health care provider. Make sure you discuss any questions you have with your healthcare provider. Document Revised: 01/22/2018 Document Reviewed: 01/22/2018 Elsevier Patient Education  2022 Elsevier Inc.  

## 2020-08-10 NOTE — Progress Notes (Signed)
I,Yamilka Roman [employer] as a Education administrator for [employer], FNP.,have documented all relevant documentation on the behalf of Minette Brine, FNP,as directed by  Minette Brine, FNP while in the presence of Minette Brine, [place].  This visit occurred during the SARS-CoV-2 public health emergency.  Safety protocols were in place, including screening questions prior to the visit, additional usage of staff PPE, and extensive cleaning of exam room while observing appropriate contact time as indicated for disinfecting solutions.  Subjective:     Patient ID: Patricia Boyer , female    DOB: 10/27/1980 , 40 y.o.   MRN: [id number]   Chief Complaint  Patient presents with   Annual Exam    HPI  Patient here for hm  Wt Readings from Last 3 Encounters: 08/10/20 : 282 lb 12.8 oz (128.3 kg) 06/30/20 : 280 lb (127 kg) 05/26/20 : 282 lb (127.9 kg)   She has had hair loss over the last couple of months. She is to have a procedure to her right eye on July 22nd. She is unable to see at night.    Past Medical History:  Diagnosis Date   Anemia    Arthritis    Cataract    GERD (gastroesophageal reflux disease)      Family History  Problem Relation Age of Onset   Hypertension Mother      Current Outpatient Medications:    busPIRone (BUSPAR) 5 MG tablet, TAKE 1 TABLET (5 MG TOTAL) BY MOUTH DAILY., Disp: 90 tablet, Rfl: 1   Phentermine-Topiramate (QSYMIA) 7.5-46 MG CP24, Take 1 tablet by mouth daily., Disp: 30 capsule, Rfl: 1   Phentermine-Topiramate (QSYMIA) 3.75-23 MG CP24, Take 1 tablet by mouth daily. (Patient not taking: Reported on 08/10/2020), Disp: 14 capsule, Rfl: 0  Current Facility-Administered Medications:    Insulin Pen Needle (NOVOFINE) 40 each, 4 packet, Subcutaneous, PRN, Minette Brine, FNP   Insulin Pen Needle (NOVOFINE) 40 each, 4 packet, Subcutaneous, PRN, Minette Brine, FNP   No Known Allergies    The patient states she uses none for birth control. Last LMP was Patient's last  menstrual period was 07/17/2020.. Negative for Dysmenorrhea and Negative for Menorrhagia. Negative for: breast discharge, breast lump(s), breast pain and breast self exam. Associated symptoms include abnormal vaginal bleeding. Pertinent negatives include abnormal bleeding (hematology), anxiety, decreased libido, depression, difficulty falling sleep, dyspareunia, history of infertility, nocturia, sexual dysfunction, sleep disturbances, urinary incontinence, urinary urgency, vaginal discharge and vaginal itching. Diet regular.  The patient states her exercise level is minimal - no intentional exercise.   The patient's tobacco use is:  Social History   Tobacco Use  Smoking Status Never  Smokeless Tobacco Never   She has been exposed to passive smoke. The patient's alcohol use is:  Social History   Substance and Sexual Activity  Alcohol Use Yes   Alcohol/week: 1.0 standard drink   Types: 1 Cans of beer per week   Additional information: Last pap 05/15/2018, next one scheduled for 05/15/2021.    Review of Systems  Constitutional: Negative.   HENT: Negative.    Eyes: Negative.   Respiratory: Negative.  Negative for cough and wheezing.   Cardiovascular: Negative.  Negative for chest pain, palpitations and leg swelling.  Gastrointestinal: Negative.   Endocrine: Negative.   Genitourinary: Negative.   Musculoskeletal: Negative.   Skin: Negative.   Allergic/Immunologic: Negative.   Neurological: Negative.  Negative for dizziness and headaches.  Hematological: Negative.   Psychiatric/Behavioral: Negative.      Today's Vitals  08/10/20 1536  BP: 124/80  Pulse: 80  Temp: 97.9 F (36.6 C)  Weight: 282 lb 12.8 oz (128.3 kg)  Height: 5' 5.8" (1.671 m)  PainSc: 0-No pain   Body mass index is 45.92 kg/m.   Objective:  Physical Exam Constitutional:      General: She is not in acute distress.    Appearance: Normal appearance. She is well-developed. She is obese.  HENT:     Head:  Normocephalic and atraumatic.     Right Ear: Hearing, tympanic membrane, ear canal and external ear normal. There is no impacted cerumen.     Left Ear: Hearing, tympanic membrane, ear canal and external ear normal. There is no impacted cerumen.     Nose:     Comments: Deferred - mask    Mouth/Throat:     Comments: Deferred - mask  Eyes:     General: Lids are normal.     Conjunctiva/sclera: Conjunctivae normal.     Pupils: Pupils are equal, round, and reactive to light.     Funduscopic exam:    Right eye: No papilledema.        Left eye: No papilledema.  Neck:     Thyroid: No thyroid mass.     Vascular: No carotid bruit.  Cardiovascular:     Rate and Rhythm: Normal rate and regular rhythm.     Pulses: Normal pulses.     Heart sounds: Normal heart sounds. No murmur heard. Pulmonary:     Effort: Pulmonary effort is normal. No respiratory distress.     Breath sounds: Normal breath sounds.  Chest:     Chest wall: No mass.  Breasts:    Right: Normal. No mass or tenderness.     Left: Normal. No mass or tenderness.  Abdominal:     General: Abdomen is flat. Bowel sounds are normal. There is no distension.     Palpations: Abdomen is soft.     Tenderness: There is no abdominal tenderness.  Genitourinary:    Uterus: Normal.      Adnexa: Right adnexa normal and left adnexa normal.       Right: No mass or tenderness.         Left: No mass or tenderness.    Musculoskeletal:        General: No swelling or tenderness. Normal range of motion.     Cervical back: Full passive range of motion without pain, normal range of motion and neck supple.     Right lower leg: No edema.     Left lower leg: No edema.  Skin:    General: Skin is warm and dry.     Capillary Refill: Capillary refill takes less than 2 seconds.  Neurological:     General: No focal deficit present.     Mental Status: She is alert and oriented to person, place, and time.     Cranial Nerves: No cranial nerve deficit.      Sensory: No sensory deficit.  Psychiatric:        Attention and Perception: Attention normal.        Mood and Affect: Affect is tearful.        Speech: Speech normal.        Behavior: Behavior normal.        Thought Content: Thought content normal.        Judgment: Judgment normal.        Assessment And Plan:     1. Encounter for general  adult medical examination w/o abnormal findings Behavior modifications discussed and diet history reviewed.   Pt will continue to exercise regularly and modify diet with low GI, plant based foods and decrease intake of processed foods.  Recommend intake of daily multivitamin, Vitamin D, and calcium.  Recommend self breast exams for preventive screenings, as well as recommend immunizations that include influenza, TDAP - CMP14+EGFR - CBC - Lipid panel  2. Encounter for hepatitis C screening test for low risk patient Will check Hepatitis C screening due to recent recommendations to screen all adults 18 years and older - Hepatitis C antibody  3. Hair loss She showed a significant patch of hair that is missing, she has been under increased stress but will check for metabolic causes. Pending results will refer to dermatology if normal. Encouraged to stay well hydrated with water.  - TSH - Vitamin B12  4. Stress at work Encouraged to focus on self care especially over the next week while she is off from work Continue with buspirone this has been effective for her anxiety  5. BMI 45.0-49.9, adult (HCC) Chronic Discussed healthy diet and regular exercise options  Encouraged to exercise at least 150 minutes per week with 2 days of strength training Weight has been stable since her last visit Will follow up on her Qsymia, she has not received yet. - Hemoglobin A1c     Patient was given opportunity to ask questions. Patient verbalized understanding of the plan and was able to repeat key elements of the plan. All questions were answered to their  satisfaction.   Minette Brine, FNP   I, Minette Brine, FNP, have reviewed all documentation for this visit. The documentation on 08/10/20 for the exam, diagnosis, procedures, and orders are all accurate and complete.   THE PATIENT IS ENCOURAGED TO PRACTICE SOCIAL DISTANCING DUE TO THE COVID-19 PANDEMIC.

## 2020-08-11 LAB — CMP14+EGFR
ALT: 11 IU/L (ref 0–32)
AST: 17 IU/L (ref 0–40)
Albumin/Globulin Ratio: 1.6 (ref 1.2–2.2)
Albumin: 4.7 g/dL (ref 3.8–4.8)
Alkaline Phosphatase: 110 IU/L (ref 44–121)
BUN/Creatinine Ratio: 11 (ref 9–23)
BUN: 10 mg/dL (ref 6–20)
Bilirubin Total: 0.7 mg/dL (ref 0.0–1.2)
CO2: 21 mmol/L (ref 20–29)
Calcium: 9.3 mg/dL (ref 8.7–10.2)
Chloride: 100 mmol/L (ref 96–106)
Creatinine, Ser: 0.94 mg/dL (ref 0.57–1.00)
Globulin, Total: 3 g/dL (ref 1.5–4.5)
Glucose: 73 mg/dL (ref 65–99)
Potassium: 3.9 mmol/L (ref 3.5–5.2)
Sodium: 137 mmol/L (ref 134–144)
Total Protein: 7.7 g/dL (ref 6.0–8.5)
eGFR: 80 mL/min/{1.73_m2} (ref >59)

## 2020-08-11 LAB — VITAMIN B12: Vitamin B-12: 580 pg/mL (ref 232–1245)

## 2020-08-11 LAB — CBC
Hematocrit: 40 % (ref 34.0–46.6)
Hemoglobin: 13.6 g/dL (ref 11.1–15.9)
MCH: 29.2 pg (ref 26.6–33.0)
MCHC: 34 g/dL (ref 31.5–35.7)
MCV: 86 fL (ref 79–97)
Platelets: 292 10*3/uL (ref 150–450)
RBC: 4.66 x10E6/uL (ref 3.77–5.28)
RDW: 14.4 % (ref 11.7–15.4)
WBC: 8.4 10*3/uL (ref 3.4–10.8)

## 2020-08-11 LAB — HEPATITIS C ANTIBODY: Hep C Virus Ab: <0.1 {s_co_ratio} (ref 0.0–0.9)

## 2020-08-11 LAB — LIPID PANEL
Chol/HDL Ratio: 2.4 ratio (ref 0.0–4.4)
Cholesterol, Total: 199 mg/dL (ref 100–199)
HDL: 84 mg/dL (ref >39)
LDL Chol Calc (NIH): 105 mg/dL — ABNORMAL HIGH (ref 0–99)
Triglycerides: 56 mg/dL (ref 0–149)
VLDL Cholesterol Cal: 10 mg/dL (ref 5–40)

## 2020-08-11 LAB — HEMOGLOBIN A1C
Est. average glucose Bld gHb Est-mCnc: 111 mg/dL
Hgb A1c MFr Bld: 5.5 % (ref 4.8–5.6)

## 2020-08-11 LAB — TSH: TSH: 2.88 u[IU]/mL (ref 0.450–4.500)

## 2020-08-12 ENCOUNTER — Ambulatory Visit (INDEPENDENT_AMBULATORY_CARE_PROVIDER_SITE_OTHER): Payer: BC Managed Care – PPO | Admitting: Psychology

## 2020-08-12 DIAGNOSIS — F418 Other specified anxiety disorders: Secondary | ICD-10-CM

## 2020-08-12 DIAGNOSIS — F3289 Other specified depressive episodes: Secondary | ICD-10-CM | POA: Diagnosis not present

## 2020-09-23 ENCOUNTER — Ambulatory Visit (INDEPENDENT_AMBULATORY_CARE_PROVIDER_SITE_OTHER): Payer: BC Managed Care – PPO | Admitting: Psychology

## 2020-09-23 DIAGNOSIS — F3289 Other specified depressive episodes: Secondary | ICD-10-CM | POA: Diagnosis not present

## 2020-10-21 ENCOUNTER — Ambulatory Visit (INDEPENDENT_AMBULATORY_CARE_PROVIDER_SITE_OTHER): Payer: BC Managed Care – PPO | Admitting: Psychology

## 2020-10-21 DIAGNOSIS — F3289 Other specified depressive episodes: Secondary | ICD-10-CM

## 2020-10-21 DIAGNOSIS — F418 Other specified anxiety disorders: Secondary | ICD-10-CM

## 2020-11-11 ENCOUNTER — Ambulatory Visit (INDEPENDENT_AMBULATORY_CARE_PROVIDER_SITE_OTHER): Payer: BC Managed Care – PPO | Admitting: Psychology

## 2020-11-11 DIAGNOSIS — F418 Other specified anxiety disorders: Secondary | ICD-10-CM

## 2020-11-11 DIAGNOSIS — F3289 Other specified depressive episodes: Secondary | ICD-10-CM

## 2020-11-14 ENCOUNTER — Ambulatory Visit: Payer: BC Managed Care – PPO | Admitting: Psychology

## 2020-11-18 ENCOUNTER — Encounter: Payer: Self-pay | Admitting: Nurse Practitioner

## 2020-11-21 ENCOUNTER — Other Ambulatory Visit: Payer: Self-pay

## 2020-11-21 ENCOUNTER — Ambulatory Visit: Payer: BC Managed Care – PPO | Admitting: Nurse Practitioner

## 2020-11-21 ENCOUNTER — Encounter: Payer: Self-pay | Admitting: Nurse Practitioner

## 2020-11-21 VITALS — BP 132/80 | HR 84 | Temp 98.3°F | Ht 68.0 in | Wt 291.2 lb

## 2020-11-21 DIAGNOSIS — F331 Major depressive disorder, recurrent, moderate: Secondary | ICD-10-CM

## 2020-11-21 DIAGNOSIS — Z2821 Immunization not carried out because of patient refusal: Secondary | ICD-10-CM

## 2020-11-21 DIAGNOSIS — R635 Abnormal weight gain: Secondary | ICD-10-CM

## 2020-11-21 DIAGNOSIS — L659 Nonscarring hair loss, unspecified: Secondary | ICD-10-CM | POA: Diagnosis not present

## 2020-11-21 DIAGNOSIS — Z6841 Body Mass Index (BMI) 40.0 and over, adult: Secondary | ICD-10-CM

## 2020-11-21 MED ORDER — QSYMIA 3.75-23 MG PO CP24: 1.0000 | ORAL_CAPSULE | Freq: Every day | ORAL | 0 refills | Status: DC

## 2020-11-21 MED ORDER — VORTIOXETINE HBR 5 MG PO TABS: 5.0000 mg | ORAL_TABLET | Freq: Every day | ORAL | 2 refills | Status: DC

## 2020-11-21 NOTE — Patient Instructions (Addendum)

## 2020-11-21 NOTE — Progress Notes (Signed)
I,Yamilka J Llittleton,acting as a Education administrator for [employer], FNP.,have documented all relevant documentation on the behalf of Minette Brine, FNP,as directed by  Minette Brine, FNP while in the presence of Minette Brine, [place].   This visit occurred during the SARS-CoV-2 public health emergency.  Safety protocols were in place, including screening questions prior to the visit, additional usage of staff PPE, and extensive cleaning of exam room while observing appropriate contact time as indicated for disinfecting solutions.  Subjective:     Patient ID: Patricia Boyer , female    DOB: May 01, 1980 , 40 y.o.   MRN: [id number]   Chief Complaint  Patient presents with   Weight Check    HPI  Patient presents today for a weight check. Patient stated she was supposed to start the qysmia but she never received the medication. She reports she never received a phone call. She has cut back on eating meat, she will do the salad kit for the last 2 weeks. She is eating more fruit during the day. And will eat pretzels. She has good and bad days with water. She has just started exercising 3 days a week in the living room, she is doing 10 minutes. She has done weight watchers and done well. She has taken phentermine in the past would give her jitters and would need to drink adequate amounts of water.   She continues to have stressors at work.   Wt Readings from Last 3 Encounters: 11/21/20 : 291 lb 3.2 oz (132.1 kg) 08/10/20 : 282 lb 12.8 oz (128.3 kg) 06/30/20 : 280 lb (127 kg)      Past Medical History:  Diagnosis Date   Anemia    Arthritis    Cataract    GERD (gastroesophageal reflux disease)      Family History  Problem Relation Age of Onset   Hypertension Mother      Current Outpatient Medications:    busPIRone (BUSPAR) 5 MG tablet, TAKE 1 TABLET (5 MG TOTAL) BY MOUTH DAILY., Disp: 90 tablet, Rfl: 1   Phentermine-Topiramate (QSYMIA) 3.75-23 MG CP24, Take 1 tablet by mouth daily., Disp: 14  capsule, Rfl: 0   vortioxetine HBr (TRINTELLIX) 5 MG TABS tablet, Take 1 tablet (5 mg total) by mouth daily., Disp: 30 tablet, Rfl: 2  Current Facility-Administered Medications:    Insulin Pen Needle (NOVOFINE) 40 each, 4 packet, Subcutaneous, PRN, Minette Brine, FNP   Insulin Pen Needle (NOVOFINE) 40 each, 4 packet, Subcutaneous, PRN, Minette Brine, FNP   No Known Allergies   Review of Systems  Constitutional: Negative.   Eyes: Negative.   Respiratory: Negative.    Cardiovascular: Negative.  Negative for chest pain, palpitations and leg swelling.  Musculoskeletal: Negative.   Skin: Negative.        Hair loss to the left back of her head.   Hematological: Negative.   Psychiatric/Behavioral: Negative.      Today's Vitals   11/21/20 1612  BP: 132/80  Pulse: 84  Temp: 98.3 F (36.8 C)  Weight: 291 lb 3.2 oz (132.1 kg)  Height: _0  (1.727 m)  PainSc: 0-No pain   Body mass index is 44.28 kg/m.   Objective:  Physical Exam Constitutional:      General: She is not in acute distress.    Appearance: Normal appearance.  Cardiovascular:     Rate and Rhythm: Normal rate and regular rhythm.     Pulses: Normal pulses.     Heart sounds: Normal heart sounds. No murmur  heard. Skin:    Comments: She has a large area of her scalp the hair is balding. She does wear dreads and has for some time  Neurological:     General: No focal deficit present.     Mental Status: She is alert and oriented to person, place, and time.     Cranial Nerves: No cranial nerve deficit.     Motor: No weakness.  Psychiatric:        Mood and Affect: Mood normal.        Behavior: Behavior normal.        Thought Content: Thought content normal.        Judgment: Judgment normal.        Assessment And Plan:     1. Class 3 severe obesity due to excess calories without serious comorbidity with body mass index (BMI) of 40.0 to 44.9 in adult [facility]) She has gained approximately 9 lbs, had not received Qsymia. I  advised if she is unable to afford the cost of med at [facility] will send to mail order pharmacy. I do feel with her stressors this may hinder her weight loss. I will refer for a sleep study as well. I also discussed resistant weight loss and encouraged to increase physical activity - Phentermine-Topiramate (QSYMIA) 3.75-23 MG CP24; Take 1 tablet by mouth daily.  Dispense: 14 capsule; Refill: 0 - Ambulatory referral to Sleep Studies  2. Hair loss Unsure if this is related to stress but will refer to Dermatology for further evaluation. I have also given her the number to a hair stylist that specializes in hair loss.  - Ambulatory referral to Dermatology  3. Influenza vaccination declined Patient declined influenza vaccination at this time. Patient is aware that influenza vaccine prevents illness in 70% of healthy people, and reduces hospitalizations to 30-70% in elderly. This vaccine is recommended annually. Pt is willing to accept risk associated with refusing vaccination.  4. Abnormal weight gain - Ambulatory referral to Sleep Studies  5. Moderate episode of recurrent major depressive disorder ([place]) She is willing to try another antidepressant, she began crying during the visit. Will try trintellix as it is weight neutral - vortioxetine HBr (TRINTELLIX) 5 MG TABS tablet; Take 1 tablet (5 mg total) by mouth daily.  Dispense: 30 tablet; Refill: 2    Patient was given opportunity to ask questions. Patient verbalized understanding of the plan and was able to repeat key elements of the plan. All questions were answered to their satisfaction.  Minette Brine, FNP   I, Minette Brine, FNP, have reviewed all documentation for this visit. The documentation on 11/21/20 for the exam, diagnosis, procedures, and orders are all accurate and complete.   IF YOU HAVE BEEN REFERRED TO A SPECIALIST, IT MAY TAKE 1-2 WEEKS TO SCHEDULE/PROCESS THE REFERRAL. IF YOU HAVE NOT HEARD FROM US/SPECIALIST IN TWO WEEKS, PLEASE GIVE  [place] A CALL AT [phone].   THE PATIENT IS ENCOURAGED TO PRACTICE SOCIAL DISTANCING DUE TO THE COVID-19 PANDEMIC.

## 2020-11-24 ENCOUNTER — Telehealth: Payer: Self-pay

## 2020-11-24 NOTE — Telephone Encounter (Signed)
Pt notified qsymia has been approved.

## 2020-12-07 ENCOUNTER — Ambulatory Visit (INDEPENDENT_AMBULATORY_CARE_PROVIDER_SITE_OTHER): Payer: BC Managed Care – PPO | Admitting: Psychology

## 2020-12-07 ENCOUNTER — Encounter: Payer: Self-pay | Admitting: Nurse Practitioner

## 2020-12-07 DIAGNOSIS — F418 Other specified anxiety disorders: Secondary | ICD-10-CM | POA: Diagnosis not present

## 2020-12-07 DIAGNOSIS — F3289 Other specified depressive episodes: Secondary | ICD-10-CM | POA: Diagnosis not present

## 2020-12-12 ENCOUNTER — Other Ambulatory Visit: Payer: Self-pay | Admitting: Nurse Practitioner

## 2020-12-12 DIAGNOSIS — Z6841 Body Mass Index (BMI) 40.0 and over, adult: Secondary | ICD-10-CM

## 2020-12-12 MED ORDER — QSYMIA 3.75-23 MG PO CP24: 1.0000 | ORAL_CAPSULE | Freq: Every day | ORAL | 0 refills | Status: DC

## 2020-12-17 ENCOUNTER — Other Ambulatory Visit: Payer: Self-pay | Admitting: Nurse Practitioner

## 2020-12-17 DIAGNOSIS — F419 Anxiety disorder, unspecified: Secondary | ICD-10-CM

## 2020-12-19 IMAGING — US US PELVIS COMPLETE WITH TRANSVAGINAL
1 series · 14 of 25 positions shown · non-contrast
Comparison: None

CLINICAL DATA: LEFT pelvic and groin pain for 2 months, LMP
12/28/2019



[Series 1: us pelvis complete with transvaginal · 0.17mm/px · 14 of 86 slices shown]
[im 1/86]
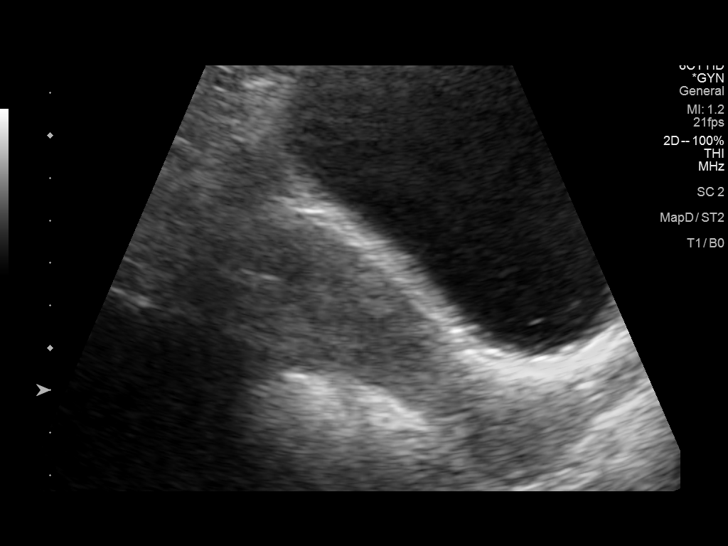
[im 8/86]
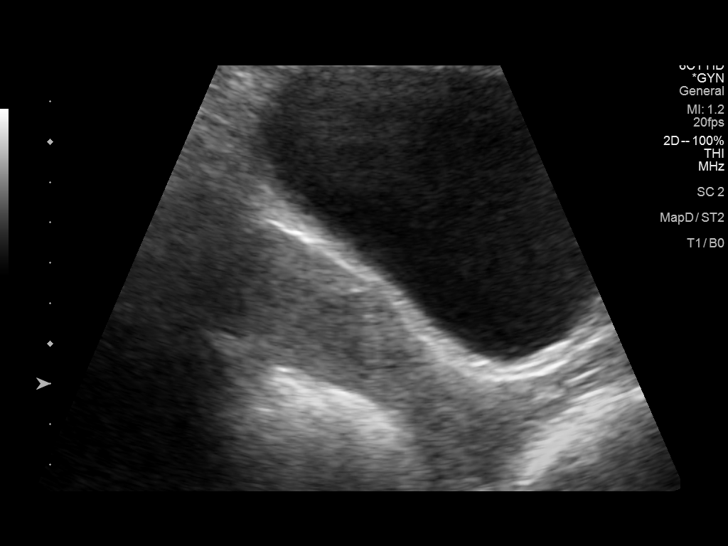
[im 15/86]
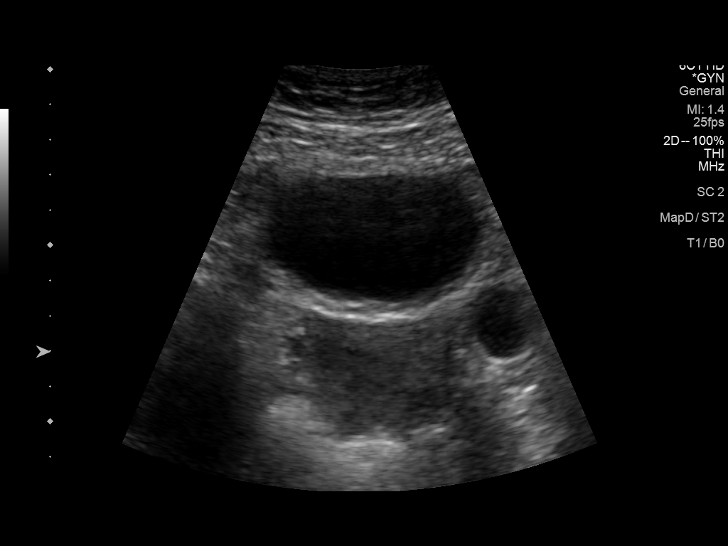
[im 22/86]
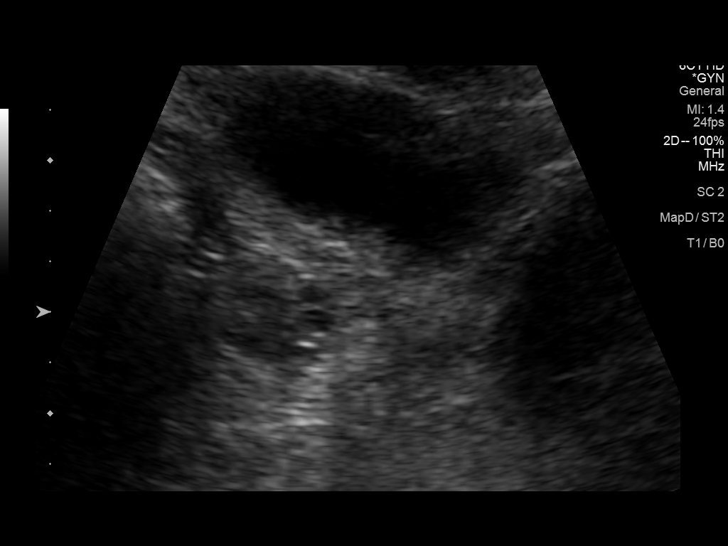
[im 29/86]
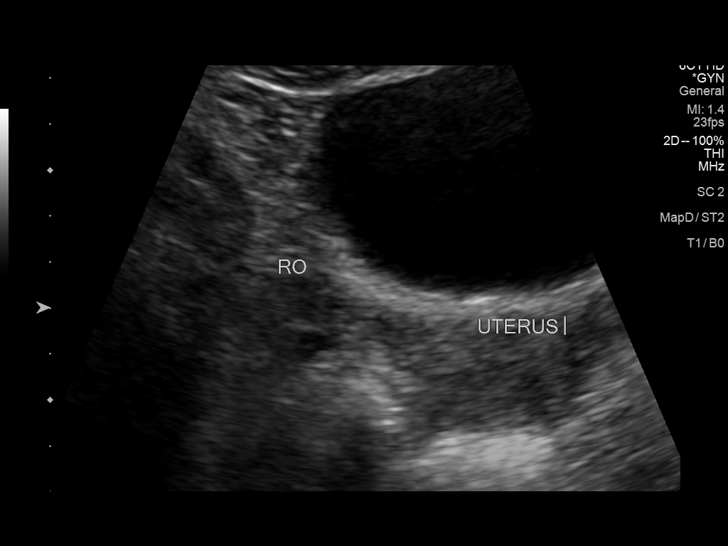
[im 32/86]
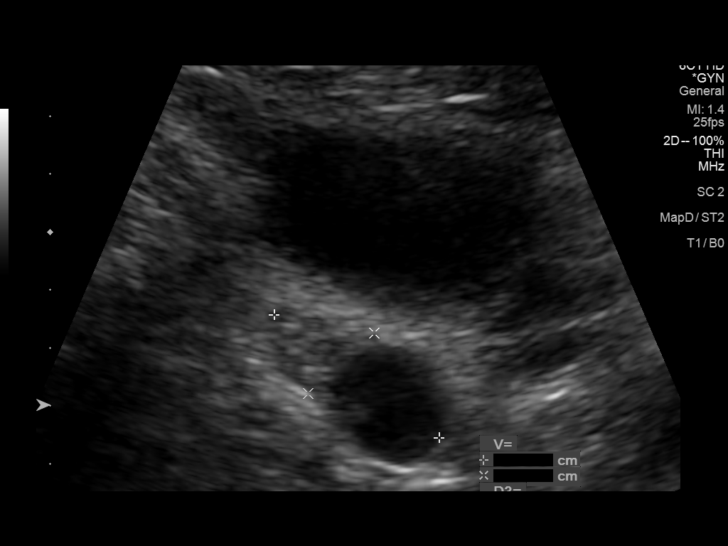
[im 39/86]
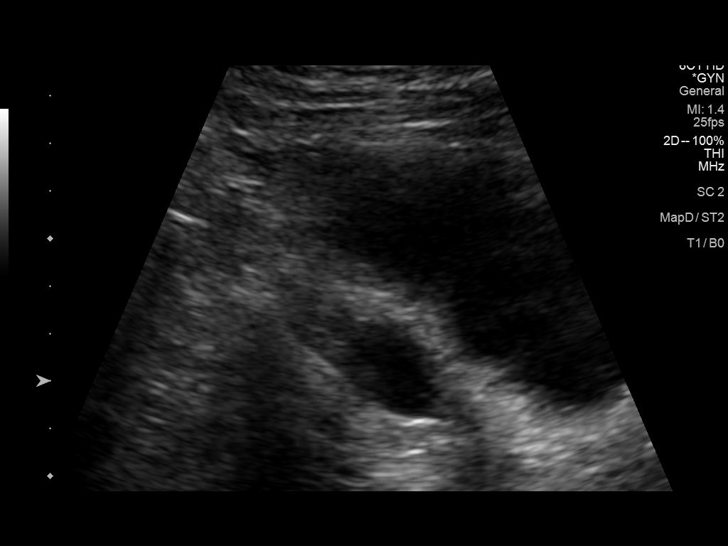
[im 47/86]
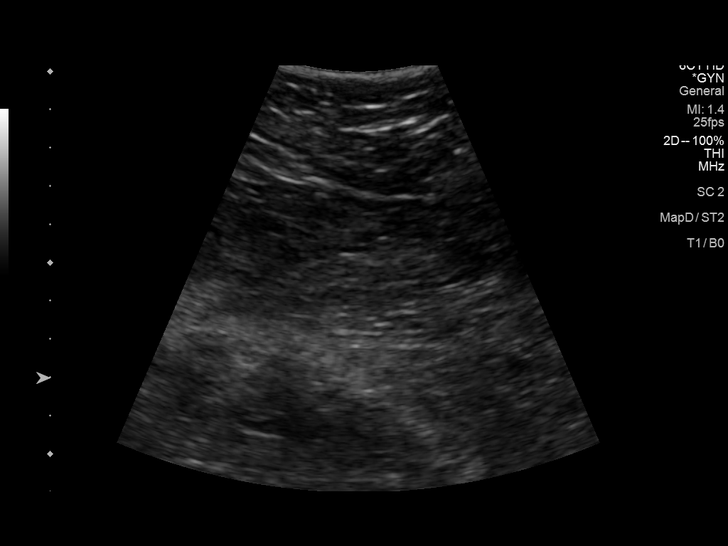
[im 54/86]
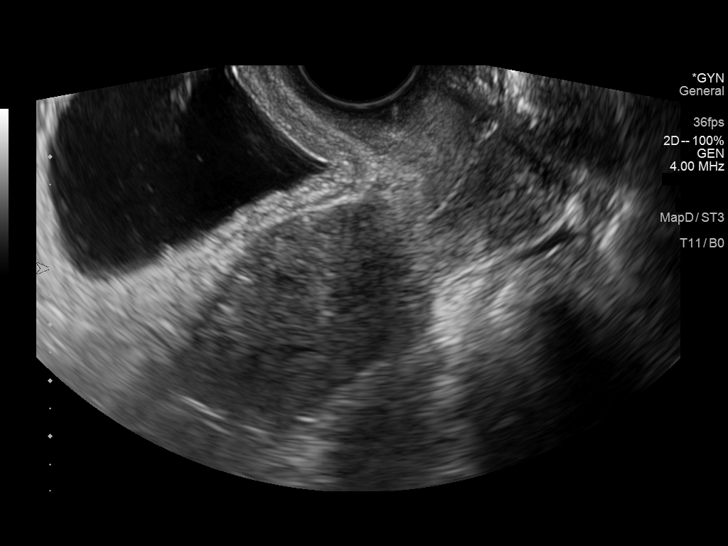
[im 57/86]
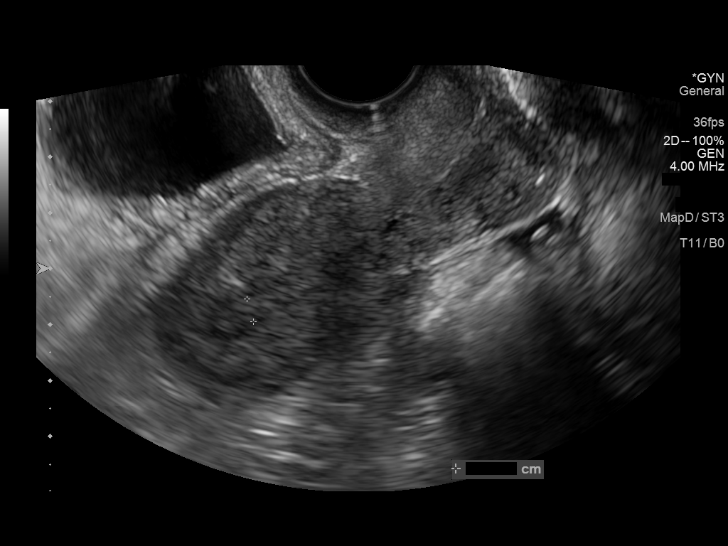
[im 64/86]
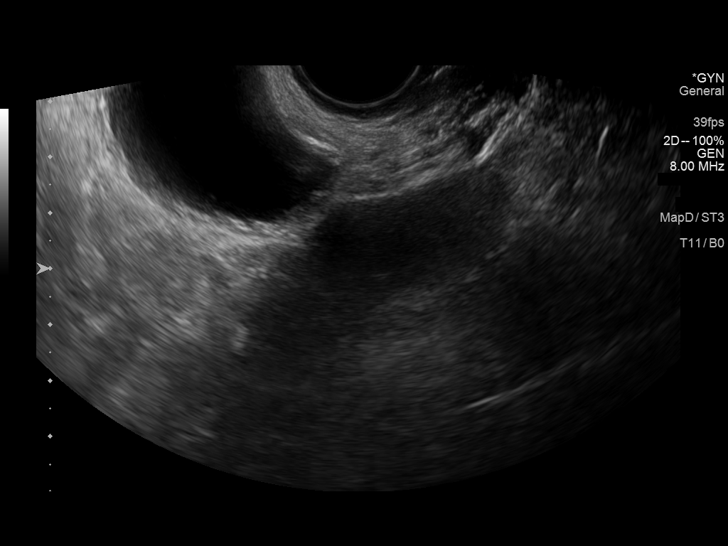
[im 71/86]
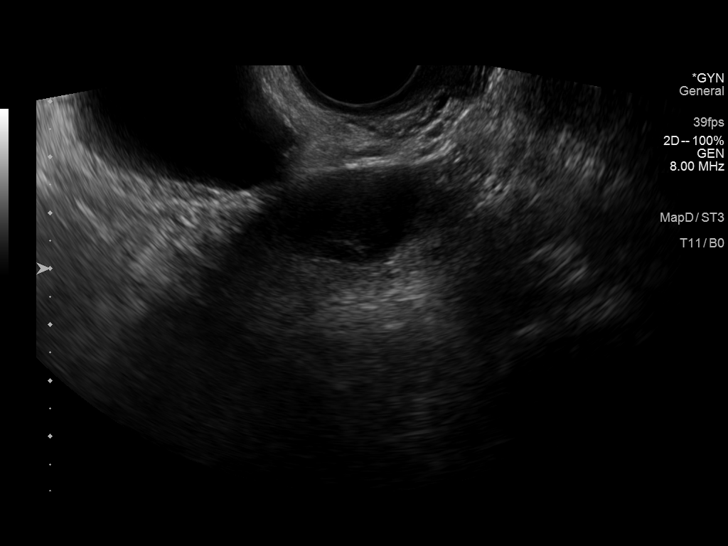
[im 78/86]
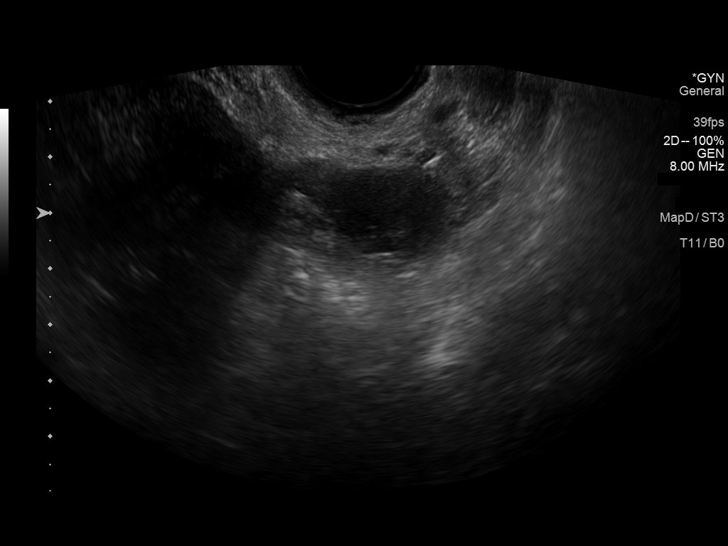
[im 86/86]
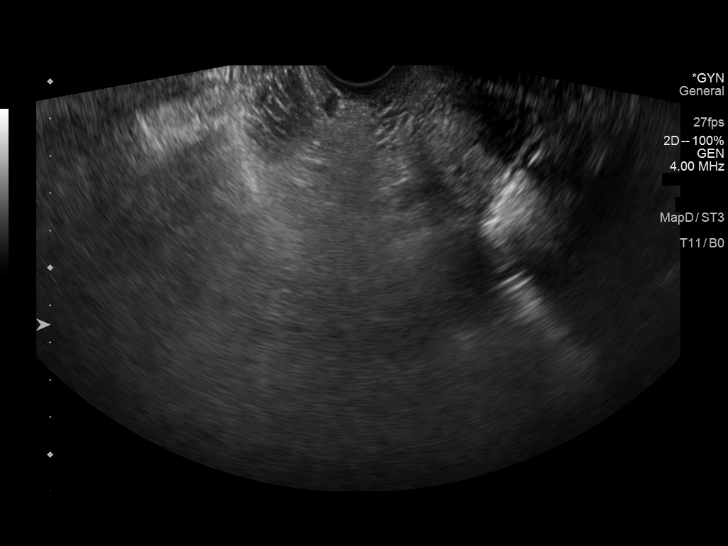

[14 of 25 positions shown; findings below may reference images not displayed]

FINDINGS: Uterus

Measurements: 7.8 x 3.1 x 5.2 cm = volume: 66 mL. Anteverted. Normal
morphology without mass

Endometrium

Thickness: 4 mm.  No endometrial fluid focal abnormality

Right ovary

Measurements: 3.3 x 2.1 x 2.0 cm = volume: 7.4 mL. Normal morphology
without mass

Left ovary

Measurements: 4.6 x 2.1 x 2.1 cm = volume: 10.4 mL. Normal
morphology without mass

Other findings

No free pelvic fluid.  No adnexal masses.
IMPRESSION: Normal exam.

## 2021-01-10 ENCOUNTER — Other Ambulatory Visit: Payer: Self-pay

## 2021-01-10 ENCOUNTER — Encounter: Payer: Self-pay | Admitting: Nurse Practitioner

## 2021-01-10 ENCOUNTER — Ambulatory Visit: Payer: BC Managed Care – PPO | Admitting: Nurse Practitioner

## 2021-01-10 VITALS — BP 134/88 | HR 93 | Temp 97.9°F | Ht 68.0 in | Wt 274.4 lb

## 2021-01-10 DIAGNOSIS — F331 Major depressive disorder, recurrent, moderate: Secondary | ICD-10-CM

## 2021-01-10 DIAGNOSIS — Z6841 Body Mass Index (BMI) 40.0 and over, adult: Secondary | ICD-10-CM | POA: Diagnosis not present

## 2021-01-10 MED ORDER — VORTIOXETINE HBR 5 MG PO TABS: 5.0000 mg | ORAL_TABLET | Freq: Every day | ORAL | 1 refills | Status: DC

## 2021-01-10 MED ORDER — QSYMIA 7.5-46 MG PO CP24: 1.0000 | ORAL_CAPSULE | Freq: Every day | ORAL | 1 refills | Status: DC

## 2021-01-10 NOTE — Patient Instructions (Signed)

## 2021-01-10 NOTE — Progress Notes (Signed)
I,Katawbba Wiggins,acting as a Neurosurgeon for [employer], FNP.,have documented all relevant documentation on the behalf of [name], FNP,as directed by  [name], FNP while in the presence of [name], FNP.   This visit occurred during the SARS-CoV-2 public health emergency.  Safety protocols were in place, including screening questions prior to the visit, additional usage of staff PPE, and extensive cleaning of exam room while observing appropriate contact time as indicated for disinfecting solutions.  Subjective:     Patient ID: Patricia Boyer , female    DOB: 05-22-1980 , 40 y.o.   MRN: [id number]   Chief Complaint  Patient presents with   Obesity    HPI  Patient presents today for a weight check.  She is tolerating Qsymia, she is also exercising more regularly. 3 times a week and will sometimes do more days. Monday, Wednesday and Saturday morning. Reports she was not having an appetite so it was difficult at first. She has tried to cut back on meat. Eating more vegetables.   Lmp - 11-24  She is also tolerating trintellix well and feels this has helped her to focus better.     Past Medical History:  Diagnosis Date   Anemia    Arthritis    Cataract    GERD (gastroesophageal reflux disease)      Family History  Problem Relation Age of Onset   Hypertension Mother      Current Outpatient Medications:    busPIRone (BUSPAR) 5 MG tablet, TAKE 1 TABLET (5 MG TOTAL) BY MOUTH DAILY., Disp: 90 tablet, Rfl: 1   Phentermine-Topiramate (QSYMIA) 7.5-46 MG CP24, Take 1 tablet by mouth daily., Disp: 30 capsule, Rfl: 1   vortioxetine HBr (TRINTELLIX) 5 MG TABS tablet, Take 1 tablet (5 mg total) by mouth daily., Disp: 90 tablet, Rfl: 1   No Known Allergies   Review of Systems  Constitutional: Negative.  Negative for fatigue.  Respiratory: Negative.  Negative for cough and wheezing.   Cardiovascular: Negative.   Neurological:  Negative for dizziness and headaches.   Psychiatric/Behavioral: Negative.      Today's Vitals   01/10/21 1602  BP: 134/88  Pulse: 93  Temp: 97.9 F (36.6 C)  Weight: 274 lb 6.4 oz (124.5 kg)  Height: 5\' 8"  (1.727 m)   Body mass index is 41.72 kg/m.  Wt Readings from Last 3 Encounters:  01/10/21 274 lb 6.4 oz (124.5 kg)  11/21/20 291 lb 3.2 oz (132.1 kg)  08/10/20 282 lb 12.8 oz (128.3 kg)    BP Readings from Last 3 Encounters:  01/10/21 134/88  11/21/20 132/80  08/10/20 124/80    Objective:  Physical Exam Vitals reviewed.  Constitutional:      General: She is not in acute distress.    Appearance: Normal appearance. She is obese.  Cardiovascular:     Rate and Rhythm: Normal rate and regular rhythm.     Pulses: Normal pulses.     Heart sounds: Normal heart sounds. No murmur heard. Neurological:     General: No focal deficit present.     Mental Status: She is alert and oriented to person, place, and time.     Cranial Nerves: No cranial nerve deficit.     Motor: No weakness.  Psychiatric:        Mood and Affect: Mood normal.        Behavior: Behavior normal.        Thought Content: Thought content normal.  Judgment: Judgment normal.        Assessment And Plan:     1. Moderate episode of recurrent major depressive disorder ([facility]) Comments: Trintellix has been effective, continue current dose. - vortioxetine HBr (TRINTELLIX) 5 MG TABS tablet; Take 1 tablet (5 mg total) by mouth daily.  Dispense: 90 tablet; Refill: 1  2. Class 3 severe obesity due to excess calories without serious comorbidity with body mass index (BMI) of 40.0 to 44.9 in adult [facility]) Comments: Continue current medications Qsymia Congratulated on her 12 lb weight loss - Phentermine-Topiramate (QSYMIA) 7.5-46 MG CP24; Take 1 tablet by mouth daily.  Dispense: 30 capsule; Refill: 1    Patient was given opportunity to ask questions. Patient verbalized understanding of the plan and was able to repeat key elements of the plan. All  questions were answered to their satisfaction.  [name], FNP   I, [name], FNP, have reviewed all documentation for this visit. The documentation on 01/10/21 for the exam, diagnosis, procedures, and orders are all accurate and complete.   IF YOU HAVE BEEN REFERRED TO A SPECIALIST, IT MAY TAKE 1-2 WEEKS TO SCHEDULE/PROCESS THE REFERRAL. IF YOU HAVE NOT HEARD FROM US/SPECIALIST IN TWO WEEKS, PLEASE GIVE [place] A CALL AT [phone].   THE PATIENT IS ENCOURAGED TO PRACTICE SOCIAL DISTANCING DUE TO THE COVID-19 PANDEMIC.

## 2021-01-12 ENCOUNTER — Ambulatory Visit: Payer: BC Managed Care – PPO | Admitting: Psychology

## 2021-01-22 ENCOUNTER — Encounter: Payer: Self-pay | Admitting: Nurse Practitioner

## 2021-02-09 ENCOUNTER — Ambulatory Visit: Payer: BC Managed Care – PPO | Admitting: Nurse Practitioner

## 2021-02-19 ENCOUNTER — Ambulatory Visit: Admit: 2021-02-19 | Disposition: A | Discharge status: Home / Self Care | Payer: BC Managed Care – PPO

## 2021-02-23 ENCOUNTER — Ambulatory Visit (INDEPENDENT_AMBULATORY_CARE_PROVIDER_SITE_OTHER): Payer: BC Managed Care – PPO | Admitting: Neurology

## 2021-02-23 ENCOUNTER — Encounter: Payer: Self-pay | Admitting: Neurology

## 2021-02-23 VITALS — BP 143/99 | HR 83 | Ht 68.0 in | Wt 265.0 lb

## 2021-02-23 DIAGNOSIS — R519 Headache, unspecified: Secondary | ICD-10-CM | POA: Diagnosis not present

## 2021-02-23 DIAGNOSIS — G478 Other sleep disorders: Secondary | ICD-10-CM

## 2021-02-23 DIAGNOSIS — Z820 Family history of epilepsy and other diseases of the nervous system: Secondary | ICD-10-CM

## 2021-02-23 DIAGNOSIS — R0683 Snoring: Secondary | ICD-10-CM

## 2021-02-23 DIAGNOSIS — G4719 Other hypersomnia: Secondary | ICD-10-CM | POA: Diagnosis not present

## 2021-02-23 DIAGNOSIS — R351 Nocturia: Secondary | ICD-10-CM

## 2021-02-23 NOTE — Progress Notes (Signed)
Subjective:    Patient ID: Patricia Boyer is a 41 y.o. female.  HPI    Star Age, MD, PhD [facility] [address] [place], [place] [address]  Dear Doreene Burke,   I saw your patient, Patricia Boyer, upon your kind request, in my Number [facility] clinic today for evaluation of her sleep disturbance, concern for obstructive sleep apnea.  The patient is unaccompanied today.  As you know, Patricia Boyer is a 41 year old right-handed woman with an underlying medical history of arthritis, reflux disease, depression, hair loss, cataract, anemia, and severe obesity with a BMI of over 40, who reports recent weight gain and difficulty losing weight, as well as difficulty initiating and maintaining sleep, nonrestorative sleep.  Patricia Boyer has had occasional morning headaches and has nocturia about once per average night.  Patricia Boyer has tried to keep a sleep schedule but has had difficulty with sleep for the past at least couple of years.  Her mom has sleep apnea.  Patient is single and lives alone, no pets in the household, no kids.  Patricia Boyer works for [employer].  Bedtime is generally around 10 PM but it may take her a long time to fall asleep, Patricia Boyer does have a TV in the bedroom and it stays on at night, Patricia Boyer has it on a sleep timer of 1 hour.  Rise time is between 630 and 7 AM.  Patricia Boyer may be awake in the middle of the night for a few hours.  Patricia Boyer has tried Tylenol PM which helps but Patricia Boyer did not want to take something nightly.  Patricia Boyer has even tried drinking some red wine at bedtime but it did not help.  Patricia Boyer does not drink alcohol regularly.  Patricia Boyer does not smoke cigarettes but has smoked hookah before on occasion.  Patricia Boyer does not drink caffeine on a day-to-day basis.  Her Epworth sleepiness score is 9 out of 24, fatigue severity score is 44 out of 63. I had evaluated her for sleep apnea several years ago.  Patricia Boyer had a baseline sleep study in 2015 which was negative for obstructive sleep apnea with a  total AHI of 0.3/h, O2 nadir 93%.    05/04/13: 41 year old right-handed woman with an underlying medical history of hypertension, reflux disease, arthritis, anemia, cataracts, asthma, LBP, obesity and recurrent headaches, who presents for followup consultation after a recent sleep study. Patricia Boyer is unaccompanied today. I first met her on 03/20/2013, at which time Patricia Boyer reported recurrent headaches as well as morning headaches and increasing blood pressure values. I felt her physical exam and history were somewhat concerning for underlying obstructive sleep apnea as Patricia Boyer had a tighter looking airway, obesity and a family history of OSA and reported recent onset hypertension and recurrent morning headaches, daytime somnolence and nonrestorative sleep. Her exam was otherwise non-focal. I requested that Patricia Boyer return for a sleep study. Patricia Boyer had a diagnostic sleep study on 04/10/2013 and I went over her test results with her in detail today. Her sleep efficiency was minimally reduced at 89.3% with a latency to sleep of 5.5 minutes and wake after sleep onset of 36 minutes with moderate sleep fragmentation noted. Patricia Boyer had a normal arousal index. Patricia Boyer had a normal percentage of stage I sleep, an increased percentage of stage II sleep, normal percentage of deep sleep, and a decreased percentage of REM sleep at 11.6% with a prolonged REM latency of 177.5 minutes. Patricia Boyer had no significant periodic leg movements or cardiac arrhythmias on EKG.  Patricia Boyer had very mild intermittent snoring. Patricia Boyer slept mostly on her back. Her total AHI was only 0.3 per hour. Baseline oxygen saturation was 97% with a nadir of 93%.    Today, Patricia Boyer reports feeling better. Patricia Boyer has been trying to walk regularly, for about an hour. Her HAs have been less frequent, her BP values are better and when Patricia Boyer walks, Patricia Boyer has better sleep that night. Patricia Boyer denies any new symptoms or problems. Patricia Boyer feels overall better. Patricia Boyer has been trying to lose weight. Patricia Boyer is trying to turn off the  TV at night. Patricia Boyer has reduced soda intake.    03/20/13: 41 year old right-handed woman with an underlying medical history of hypertension, reflux disease, arthritis, anemia, cataracts, asthma, LBP, obesity and recurrent headaches, who recently saw you on 03/11/2013 for recurrent headaches as well as high blood pressure. Her blood pressure was indeed elevated at the time at 130/100. Patricia Boyer reported increased frequency of headaches which are posterior in location and reported excessive daytime somnolence. Patricia Boyer started having a HA about a month and 2 ago and someone suggested checking her BP and Patricia Boyer noted, it was indeed up. Patricia Boyer has gained weight over the last years and was treated with high dose steroids with slow wean about 4 years ago for LBP. Patricia Boyer goes to bed at 11 PM to MN and falls asleep within minutes. Patricia Boyer wakes up tired and has to wake up at 6 AM, but has overslept recently. Patricia Boyer has EDS and and ESS of 8/24 today. Patricia Boyer feels sleepy at work. Patricia Boyer falls asleep often on the couch between 5-8 PM after Patricia Boyer comes home from work. Patricia Boyer lives alone and has no children. 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 Patricia Boyer does not smoke and drinks EtOH once a week. Patricia Boyer does not take illicits. Patricia Boyer does not have RLS Sx and is not known to kick in her sleep. Patricia Boyer has had AM HAs, especially last month. Patricia Boyer has been started on HCTZ on 03/11/13. Her BP is better and Patricia Boyer had it check at school by the school nurse as well and her HAs are better, but not gone. Patricia Boyer has taken Fioricet infrequently. Patricia Boyer drinks caffeine, coffee in AM. Patricia Boyer has not been told that Patricia Boyer snores, but snores when congested. Patricia Boyer denies gasping for air at night and has not been reported to have apneas. Patricia Boyer sleeps with the TV on. Patricia Boyer sleeps alone for the most part and has no pets. Her mother has been diagnosed with OSA.  Her Past Medical History Is Significant For: Past Medical History:  Diagnosis Date   Anemia    Arthritis    Cataract    GERD (gastroesophageal  reflux disease)     Her Past Surgical History Is Significant For: History reviewed. No pertinent surgical history.  Her Family History Is Significant For: Family History  Problem Relation Age of Onset   Hypertension Mother    Sleep apnea Mother     Her Social History Is Significant For: Social History   Socioeconomic History   Marital status: Single    Spouse name: Not on file   Number of children: Not on file   Years of education: Not on file   Highest education level: Not on file  Occupational History   Not on file  Tobacco Use   Smoking status: Never   Smokeless tobacco: Never  Substance and Sexual Activity   Alcohol use: Yes    Alcohol/week: 1.0 standard drink    Types: 1 Cans  of beer per week   Drug use: No   Sexual activity: Yes    Birth control/protection: Condom  Other Topics Concern   Not on file  Social History Narrative   Lives alone   Right handed   Caffeine: 1.5 cups/day   Social Determinants of Health   Financial Resource Strain: Not on file  Food Insecurity: Not on file  Transportation Needs: Not on file  Physical Activity: Not on file  Stress: Not on file  Social Connections: Not on file    Her Allergies Are:  No Known Allergies:   Her Current Medications Are:  Outpatient Encounter Medications as of 02/23/2021  Medication Sig   busPIRone (BUSPAR) 5 MG tablet TAKE 1 TABLET (5 MG TOTAL) BY MOUTH DAILY.   Phentermine-Topiramate (QSYMIA) 7.5-46 MG CP24 Take 1 tablet by mouth daily.   vortioxetine HBr (TRINTELLIX) 5 MG TABS tablet Take 1 tablet (5 mg total) by mouth daily.   No facility-administered encounter medications on file as of 02/23/2021.  :   Review of Systems:  Out of a complete 14 point review of systems, all are reviewed and negative with the exception of these symptoms as listed below:  Review of Systems  Neurological:        Patient here alone for sleep consult. Patricia Boyer reports Patricia Boyer wakes up a lot during the night, Patricia Boyer is tired  during the day. Sometimes Patricia Boyer snores.    Objective:  Neurological Exam  Physical Exam Physical Examination:   Vitals:   02/23/21 0926  BP: (!) 143/99  Pulse: 83    General Examination: The patient is a very pleasant 41 y.o. female in no acute distress. Patricia Boyer appears well-developed and well-nourished and well groomed.   HEENT: Normocephalic, atraumatic, pupils are equal, round and reactive to light.  Patricia Boyer has contact lenses in place.  Tracking is slightly disconjugate.  Patricia Boyer has a L esotropia. Hearing is grossly intact. Face is symmetric with normal facial animation. Speech is clear with no dysarthria noted. There is no hypophonia. There is no lip, neck/head, jaw or voice tremor. Neck is supple with full range of passive and active motion. There are no carotid bruits on auscultation. Oropharynx exam reveals: mild mouth dryness, good dental hygiene and mild airway crowding, due to wider uvula and tonsils of 1+, Mallampati is class II. Tongue protrudes centrally and palate elevates symmetrically. Neck size is 15 inches. Patricia Boyer has a minimal overbite.  Chest: Clear to auscultation without wheezing, rhonchi or crackles noted.  Heart: S1+S2+0, regular and normal without murmurs, rubs or gallops noted.   Abdomen: Soft, non-tender and non-distended.  Extremities: There is no obvious edema in the distal lower extremities bilaterally.   Skin: Warm and dry without trophic changes noted.   Musculoskeletal: exam reveals no obvious joint deformities.   Neurologically:  Mental status: The patient is awake, alert and oriented in all 4 spheres. Her immediate and remote memory, attention, language skills and fund of knowledge are appropriate. There is no evidence of aphasia, agnosia, apraxia or anomia. Speech is clear with normal prosody and enunciation. Thought process is linear. Mood is normal and affect is normal.  Cranial nerves II - XII are as described above under HEENT exam.  Motor exam: Normal bulk,  strength and tone is noted. There is no tremor, Romberg is negative. Fine motor skills and coordination: grossly intact.  Cerebellar testing: No dysmetria or intention tremor. There is no truncal or gait ataxia.  Sensory exam: intact to light touch in  the upper and lower extremities.  Gait, station and balance: Patricia Boyer stands easily. No veering to one side is noted. No leaning to one side is noted. Posture is age-appropriate and stance is narrow based. Gait shows normal stride length and normal pace. No problems turning are noted. Tandem walk is unremarkable.                Assessment and Plan:  In summary, PALMYRA ROGACKI is a very pleasant 41 y.o.-year old female with an underlying medical history of arthritis, reflux disease, depression, hair loss, cataract, anemia, and severe obesity with a BMI of over 40, whose history and physical exam are concerning for obstructive sleep apnea (OSA).  Prior testing in 2015 was negative for any significant sleep disordered breathing.  Nevertheless, Patricia Boyer has significant symptoms and these would warrant reevaluation. I had a long chat with the patient about my findings and the diagnosis of OSA, its prognosis and treatment options. We talked about medical treatments, surgical interventions and non-pharmacological approaches. I explained in particular the risks and ramifications of untreated moderate to severe OSA, especially with respect to developing cardiovascular disease down the Road, including congestive heart failure, difficult to treat hypertension, cardiac arrhythmias, or stroke. Even type 2 diabetes has, in part, been linked to untreated OSA. Symptoms of untreated OSA include daytime sleepiness, memory problems, mood irritability and mood disorder such as depression and anxiety, lack of energy, as well as recurrent headaches, especially morning headaches. We talked about trying to maintain a healthy lifestyle in general, as well as the importance of weight control. We  also talked about the importance of good sleep hygiene. I recommended the following at this time: sleep study.   Patricia Boyer has had blood work in June 2022 which I reviewed.  B12 and TSH were unremarkable, lipid panel benign, no evidence of anemia.  Patricia Boyer does have a history of anemia in the past and recalls taking iron pills before.  Patricia Boyer is also advised to have her vitamin D level rechecked through your office.  No recent vitamin D level was in her chart.   I explained the sleep test procedure to the patient and also outlined possible surgical and non-surgical treatment options of OSA, including the use of a custom-made dental device (which would require a referral to a specialist dentist or oral surgeon), upper airway surgical options, such as traditional UPPP or a novel less invasive surgical option in the form of Inspire hypoglossal nerve stimulation (which would involve a referral to an ENT surgeon). I also explained the CPAP treatment option to the patient, who indicated that Patricia Boyer would be willing to try CPAP if the need arises. I explained the importance of being compliant with PAP treatment, not only for insurance purposes but primarily to improve Her symptoms, and for the patient's long term health benefit, including to reduce Her cardiovascular risks. I answered all her questions today and the patient was in agreement. I plan to see her back after the sleep study is completed and encouraged her to call with any interim questions, concerns, problems or updates.   Thank you very much for allowing me to participate in the care of this nice patient. If I can be of any further assistance to you please do not hesitate to call me at [phone].  Sincerely,   Star Age, MD, PhD

## 2021-02-23 NOTE — Patient Instructions (Signed)
It was nice to see you again today.  We can certainly proceed with a sleep study to reevaluate you for sleep apnea.  Even, if you have mild OSA, I may want you to consider treatment with CPAP, as treatment of even borderline or mild sleep apnea can result and improvement of symptoms such as sleep disruption, daytime sleepiness, nighttime bathroom breaks, restless leg symptoms, improvement of headache syndromes, even improved mood disorder.   As explained, an attended sleep study meaning you get to stay overnight in the sleep lab, lets [place] monitor sleep-related behaviors such as sleep talking and leg movements in sleep, in addition to monitoring for sleep apnea.  A home sleep test is a screening tool for sleep apnea only, and unfortunately does not help with any other sleep-related diagnoses.  Please remember, the long-term risks and ramifications of untreated moderate to severe obstructive sleep apnea are: increased Cardiovascular disease, including congestive heart failure, stroke, difficult to control hypertension, treatment resistant obesity, arrhythmias, especially irregular heartbeat commonly known as A. Fib. (atrial fibrillation); even type 2 diabetes has been linked to untreated OSA.   Sleep apnea can cause disruption of sleep and sleep deprivation in most cases, which, in turn, can cause recurrent headaches, problems with memory, mood, concentration, focus, and vigilance. Most people with untreated sleep apnea report excessive daytime sleepiness, which can affect their ability to drive. Please do not drive if you feel sleepy. Patients with sleep apnea can also develop difficulty initiating and maintaining sleep (aka insomnia).   Having sleep apnea may increase your risk for other sleep disorders, including involuntary behaviors sleep such as sleep terrors, sleep talking, sleepwalking.    Having sleep apnea can also increase your risk for restless leg syndrome and leg movements at night.   Please  note that untreated obstructive sleep apnea may carry additional perioperative morbidity. Patients with significant obstructive sleep apnea (typically, in the moderate to severe degree) should receive, if possible, perioperative PAP (positive airway pressure) therapy and the surgeons and particularly the anesthesiologists should be informed of the diagnosis and the severity of the sleep disordered breathing.   I will likely see you back after your sleep study to go over the test results and where to go from there. We will call you after your sleep study to advise about the results (most likely, you will hear from [facility] my nurse) and to set up an appointment at the time, as necessary.    Our sleep lab administrative assistant will call you to schedule your sleep study and give you further instructions, regarding the check in process for the sleep study, arrival time, what to bring, when you can expect to leave after the study, etc., and to answer any other logistical questions you may have. If you don't hear back from her by about 2 weeks from now, please feel free to call her direct line at [phone] or you can call our general clinic number, or email [place] through My Chart.

## 2021-02-28 ENCOUNTER — Ambulatory Visit: Payer: BC Managed Care – PPO | Admitting: Nurse Practitioner

## 2021-02-28 ENCOUNTER — Other Ambulatory Visit: Payer: Self-pay

## 2021-02-28 ENCOUNTER — Encounter: Payer: Self-pay | Admitting: Nurse Practitioner

## 2021-02-28 VITALS — BP 130/76 | HR 95 | Temp 98.1°F | Ht 67.0 in | Wt 266.2 lb

## 2021-02-28 DIAGNOSIS — Z6841 Body Mass Index (BMI) 40.0 and over, adult: Secondary | ICD-10-CM

## 2021-02-28 DIAGNOSIS — F331 Major depressive disorder, recurrent, moderate: Secondary | ICD-10-CM

## 2021-02-28 DIAGNOSIS — F419 Anxiety disorder, unspecified: Secondary | ICD-10-CM

## 2021-02-28 DIAGNOSIS — L0292 Furuncle, unspecified: Secondary | ICD-10-CM

## 2021-02-28 DIAGNOSIS — L659 Nonscarring hair loss, unspecified: Secondary | ICD-10-CM | POA: Diagnosis not present

## 2021-02-28 DIAGNOSIS — R051 Acute cough: Secondary | ICD-10-CM

## 2021-02-28 MED ORDER — PHENTERMINE-TOPIRAMATE ER 11.25-69 MG PO CP24: 1.0000 | ORAL_CAPSULE | Freq: Every day | ORAL | 1 refills | Status: DC

## 2021-02-28 MED ORDER — VORTIOXETINE HBR 10 MG PO TABS: 10.0000 mg | ORAL_TABLET | Freq: Every day | ORAL | 1 refills | Status: DC

## 2021-02-28 NOTE — Progress Notes (Signed)
I,Yamilka J Llittleton,acting as a Neurosurgeon for [employer], FNP.,have documented all relevant documentation on the behalf of [name], FNP,as directed by  [name], FNP while in the presence of [name], FNP.   This visit occurred during the SARS-CoV-2 public health emergency.  Safety protocols were in place, including screening questions prior to the visit, additional usage of staff PPE, and extensive cleaning of exam room while observing appropriate contact time as indicated for disinfecting solutions.  Subjective:     Patient ID: Patricia Boyer , female    DOB: 1980/08/26 , 41 y.o.   MRN: [id number]   Chief Complaint  Patient presents with   Anxiety    HPI  Patient presents today for an anxiety f/u. She reports the medication is working well for her, she feels like it is working but has not seen a huge change. She does have days when she wants to isolate which have been more frequent. She had a cough last week and took 2 covid test and went to urgent care and both were negative. Was treated with promethazine.   Wt Readings from Last 3 Encounters: 02/28/21 : 266 lb 3.2 oz (120.7 kg) 02/23/21 : 265 lb (120.2 kg) 01/10/21 : 274 lb 6.4 oz (124.5 kg)    Anxiety Presents for follow-up visit. Patient reports no chest pain, dizziness or palpitations.      Past Medical History:  Diagnosis Date   Anemia    Arthritis    Cataract    GERD (gastroesophageal reflux disease)      Family History  Problem Relation Age of Onset   Hypertension Mother    Sleep apnea Mother      Current Outpatient Medications:    busPIRone (BUSPAR) 5 MG tablet, TAKE 1 TABLET (5 MG TOTAL) BY MOUTH DAILY., Disp: 90 tablet, Rfl: 1   Phentermine-Topiramate 11.25-69 MG CP24, Take 1 tablet by mouth daily., Disp: 30 capsule, Rfl: 1   vortioxetine HBr (TRINTELLIX) 10 MG TABS tablet, Take 1 tablet (10 mg total) by mouth daily., Disp: 90 tablet, Rfl: 1   No Known Allergies   Review of Systems   Constitutional: Negative.   HENT:  Positive for postnasal drip and rhinorrhea.   Respiratory: Negative.    Cardiovascular: Negative.  Negative for chest pain, palpitations and leg swelling.  Skin:        Removed her nipple piercings in October 2022 due to her clothes rubbing on them has been having a raised area to her nipple - has occurred two times. Will fill up with "pus" then go away. The first time was in December and January (2023)  Neurological: Negative.  Negative for dizziness and headaches.  Psychiatric/Behavioral: Negative.      Today's Vitals   02/28/21 0822  BP: 130/76  Pulse: 95  Temp: 98.1 F (36.7 C)  Weight: 266 lb 3.2 oz (120.7 kg)  Height: 5\' 7"  (1.702 m)  PainSc: 0-No pain   Body mass index is 41.69 kg/m.   Objective:  Physical Exam Vitals reviewed.  Constitutional:      General: She is not in acute distress.    Appearance: Normal appearance.  Cardiovascular:     Rate and Rhythm: Normal rate and regular rhythm.     Pulses: Normal pulses.     Heart sounds: Normal heart sounds. No murmur heard. Pulmonary:     Effort: Pulmonary effort is normal. No respiratory distress.     Breath sounds: Normal breath sounds. No wheezing.  Skin:  General: Skin is warm and dry.     Capillary Refill: Capillary refill takes less than 2 seconds.     Comments: Right nipple with firm area and healing   Neurological:     General: No focal deficit present.     Mental Status: She is alert and oriented to person, place, and time.     Cranial Nerves: No cranial nerve deficit.     Motor: No weakness.  Psychiatric:        Mood and Affect: Mood normal.        Behavior: Behavior normal.        Thought Content: Thought content normal.        Judgment: Judgment normal.        Assessment And Plan:     1. Anxiety Comments: Continues to have episodes at work but is tolerating buspirone well   2. Moderate episode of recurrent major depressive disorder (HCC) Comments:  Trintellix has been effective, continue current dose. - vortioxetine HBr (TRINTELLIX) 10 MG TABS tablet; Take 1 tablet (10 mg total) by mouth daily.  Dispense: 90 tablet; Refill: 1  3. Hair loss Comments: Will check Vitamin D level  - VITAMIN D 25 Hydroxy (Vit-D Deficiency, Fractures)  4. Boil Comments: She has a healing boil to her right breast, firm area noted as well. Due to the reoccurrence will treat with antibiotic  5. Acute cough Comments: This has improved since taking promethazine cough syrup, had negative covid test x 2  6. Class 3 severe obesity due to excess calories without serious comorbidity with body mass index (BMI) of 40.0 to 44.9 in adult [facility]) Comments: Weight is stable. Will increase Qsymia to 11.25/69.  Rx sent to her pharmacy. Return in 2 months for weight check - Phentermine-Topiramate 11.25-69 MG CP24; Take 1 tablet by mouth daily.  Dispense: 30 capsule; Refill: 1     Patient was given opportunity to ask questions. Patient verbalized understanding of the plan and was able to repeat key elements of the plan. All questions were answered to their satisfaction.  [name], FNP   I, [name], FNP, have reviewed all documentation for this visit. The documentation on 02/28/21 for the exam, diagnosis, procedures, and orders are all accurate and complete.   IF YOU HAVE BEEN REFERRED TO A SPECIALIST, IT MAY TAKE 1-2 WEEKS TO SCHEDULE/PROCESS THE REFERRAL. IF YOU HAVE NOT HEARD FROM US/SPECIALIST IN TWO WEEKS, PLEASE GIVE [place] A CALL AT [phone].   THE PATIENT IS ENCOURAGED TO PRACTICE SOCIAL DISTANCING DUE TO THE COVID-19 PANDEMIC.

## 2021-02-28 NOTE — Patient Instructions (Signed)

## 2021-03-01 ENCOUNTER — Other Ambulatory Visit: Payer: Self-pay | Admitting: Nurse Practitioner

## 2021-03-01 DIAGNOSIS — E559 Vitamin D deficiency, unspecified: Secondary | ICD-10-CM

## 2021-03-01 LAB — VITAMIN D 25 HYDROXY (VIT D DEFICIENCY, FRACTURES): Vit D, 25-Hydroxy: 10.8 ng/mL — ABNORMAL LOW (ref 30.0–100.0)

## 2021-03-01 MED ORDER — VITAMIN D (ERGOCALCIFEROL) 1.25 MG (50000 UNIT) PO CAPS: 50000.0000 [IU] | ORAL_CAPSULE | ORAL | 1 refills | Status: DC

## 2021-03-02 ENCOUNTER — Telehealth: Payer: Self-pay | Admitting: Neurology

## 2021-03-02 NOTE — Telephone Encounter (Signed)
LVM for pt to call me back to schedule sleep study  

## 2021-03-07 ENCOUNTER — Telehealth: Payer: Self-pay | Admitting: Neurology

## 2021-03-07 NOTE — Telephone Encounter (Signed)
LVM for pt to call me back to schedule sleep study  

## 2021-03-16 ENCOUNTER — Telehealth: Payer: Self-pay

## 2021-03-16 NOTE — Telephone Encounter (Signed)
We have attempted to call the patient 2 times to schedule sleep study. Patient has been unavailable at the phone numbers we have on file and has not returned our calls. If patient calls back we will schedule them for their sleep study. ° °

## 2021-05-03 ENCOUNTER — Ambulatory Visit: Payer: BC Managed Care – PPO | Admitting: Nurse Practitioner

## 2021-06-20 ENCOUNTER — Other Ambulatory Visit: Payer: Self-pay | Admitting: Nurse Practitioner

## 2021-06-20 DIAGNOSIS — F419 Anxiety disorder, unspecified: Secondary | ICD-10-CM

## 2021-06-28 ENCOUNTER — Other Ambulatory Visit: Payer: Self-pay | Admitting: Nurse Practitioner

## 2021-08-21 ENCOUNTER — Encounter: Payer: Self-pay | Admitting: Nurse Practitioner

## 2021-08-21 ENCOUNTER — Ambulatory Visit (INDEPENDENT_AMBULATORY_CARE_PROVIDER_SITE_OTHER): Payer: BC Managed Care – PPO | Admitting: Nurse Practitioner

## 2021-08-21 ENCOUNTER — Other Ambulatory Visit (HOSPITAL_COMMUNITY)
Admission: RE | Admit: 2021-08-21 | Discharge: 2021-08-21 | Disposition: A | Discharge status: Home / Self Care | Payer: BC Managed Care – PPO | Source: Ambulatory Visit | Attending: Nurse Practitioner | Admitting: Nurse Practitioner

## 2021-08-21 VITALS — BP 128/64 | HR 74 | Temp 98.2°F | Ht 68.0 in | Wt 271.2 lb

## 2021-08-21 DIAGNOSIS — Z1231 Encounter for screening mammogram for malignant neoplasm of breast: Secondary | ICD-10-CM

## 2021-08-21 DIAGNOSIS — Z124 Encounter for screening for malignant neoplasm of cervix: Secondary | ICD-10-CM | POA: Diagnosis present

## 2021-08-21 DIAGNOSIS — F419 Anxiety disorder, unspecified: Secondary | ICD-10-CM

## 2021-08-21 DIAGNOSIS — Z113 Encounter for screening for infections with a predominantly sexual mode of transmission: Secondary | ICD-10-CM

## 2021-08-21 DIAGNOSIS — E66813 Obesity, class 3: Secondary | ICD-10-CM

## 2021-08-21 DIAGNOSIS — Z0000 Encounter for general adult medical examination without abnormal findings: Secondary | ICD-10-CM | POA: Diagnosis not present

## 2021-08-21 DIAGNOSIS — E559 Vitamin D deficiency, unspecified: Secondary | ICD-10-CM | POA: Diagnosis not present

## 2021-08-21 DIAGNOSIS — F331 Major depressive disorder, recurrent, moderate: Secondary | ICD-10-CM

## 2021-08-21 DIAGNOSIS — Z6841 Body Mass Index (BMI) 40.0 and over, adult: Secondary | ICD-10-CM

## 2021-08-21 MED ORDER — BUSPIRONE HCL 5 MG PO TABS: 5.0000 mg | ORAL_TABLET | Freq: Every day | ORAL | 1 refills | Status: DC

## 2021-08-21 MED ORDER — QSYMIA 11.25-69 MG PO CP24: 1.0000 | ORAL_CAPSULE | Freq: Every day | ORAL | 1 refills | Status: DC

## 2021-08-21 MED ORDER — QSYMIA 3.75-23 MG PO CP24: 1.0000 | ORAL_CAPSULE | Freq: Every day | ORAL | 0 refills | Status: DC

## 2021-08-21 MED ORDER — VORTIOXETINE HBR 10 MG PO TABS: 10.0000 mg | ORAL_TABLET | Freq: Every day | ORAL | 1 refills | Status: DC

## 2021-08-21 NOTE — Progress Notes (Unsigned)
Barnet [place] Martin,acting as a Education administrator for Minette Brine, FNP.,have documented all relevant documentation on the behalf of Minette Brine, FNP,as directed by  Minette Brine, FNP while in the presence of Minette Brine, [place].   Subjective:     Patient ID: Patricia Boyer , female    DOB: 09/26/1980 , 41 y.o.   MRN: [id number]   Chief Complaint  Patient presents with   Annual Exam    HPI  Patient presents today for HM.  She is currently looking for new employment due to high stress at work.  Patient would like a PAP today.   Wt Readings from Last 3 Encounters: 08/21/21 : 271 lb 3.2 oz (123 kg) 02/28/21 : 266 lb 3.2 oz (120.7 kg) 02/23/21 : 265 lb (120.2 kg)       Past Medical History:  Diagnosis Date   Anemia    Arthritis    Cataract    GERD (gastroesophageal reflux disease)      Family History  Problem Relation Age of Onset   Hypertension Mother    Sleep apnea Mother      Current Outpatient Medications:    Phentermine-Topiramate (QSYMIA) 3.75-23 MG CP24, Take 1 tablet by mouth daily., Disp: 30 capsule, Rfl: 0   busPIRone (BUSPAR) 5 MG tablet, Take 1 tablet (5 mg total) by mouth daily., Disp: 90 tablet, Rfl: 1   vortioxetine HBr (TRINTELLIX) 10 MG TABS tablet, Take 1 tablet (10 mg total) by mouth daily., Disp: 90 tablet, Rfl: 1   No Known Allergies    The patient states she uses none for birth control. Patient's last menstrual period was 08/18/2021 (exact date).. Negative for Dysmenorrhea and Negative for Menorrhagia. May and June she had abnormal cycle flow and this month came on 3 days early. Negative for: breast discharge, breast lump(s), breast pain and breast self exam. Associated symptoms include abnormal vaginal bleeding. Pertinent negatives include abnormal bleeding (hematology), anxiety, decreased libido, depression, difficulty falling sleep, dyspareunia, history of infertility, nocturia, sexual dysfunction, sleep disturbances, urinary incontinence, urinary urgency,  vaginal discharge and vaginal itching. Diet regular, she was good through April. She no longer has an office. By April she was in a size 16. She had stopped being able to bring her different foods. The patient states her exercise level is minimal - began walking in the last week. She is working until 7p - working 10 hour days until school starts back.   The patient's tobacco use is:  Social History   Tobacco Use  Smoking Status Never  Smokeless Tobacco Never   She has been exposed to passive smoke. The patient's alcohol use is:  Social History   Substance and Sexual Activity  Alcohol Use Yes   Alcohol/week: 1.0 standard drink of alcohol   Types: 1 Cans of beer per week   Additional information: Last pap 05/15/2018, next one scheduled for today.    Review of Systems  Constitutional: Negative.   HENT: Negative.    Eyes: Negative.   Respiratory: Negative.  Negative for cough and wheezing.   Cardiovascular: Negative.  Negative for chest pain, palpitations and leg swelling.  Gastrointestinal: Negative.   Endocrine: Negative.   Genitourinary: Negative.   Musculoskeletal: Negative.   Skin: Negative.   Allergic/Immunologic: Negative.   Neurological: Negative.  Negative for dizziness and headaches.  Hematological: Negative.   Psychiatric/Behavioral: Negative.       Today's Vitals   08/21/21 0909  BP: 128/64  Pulse: 74  Temp: 98.2 F (36.8 C)  TempSrc: Oral  Weight: 271 lb 3.2 oz (123 kg)  Height: 5' 8"  (1.727 m)  PainSc: 0-No pain   Body mass index is 41.24 kg/m.  Wt Readings from Last 3 Encounters:  08/21/21 271 lb 3.2 oz (123 kg)  02/28/21 266 lb 3.2 oz (120.7 kg)  02/23/21 265 lb (120.2 kg)     Objective:  Physical Exam Vitals reviewed.  Constitutional:      General: She is not in acute distress.    Appearance: Normal appearance. She is well-developed. She is obese.  HENT:     Head: Normocephalic and atraumatic.     Right Ear: Hearing, tympanic membrane, ear  canal and external ear normal. There is no impacted cerumen.     Left Ear: Hearing, tympanic membrane, ear canal and external ear normal. There is no impacted cerumen.     Nose:     Comments: Deferred - mask    Mouth/Throat:     Comments: Deferred - mask  Eyes:     General: Lids are normal.     Conjunctiva/sclera: Conjunctivae normal.     Pupils: Pupils are equal, round, and reactive to light.     Funduscopic exam:    Right eye: No papilledema.        Left eye: No papilledema.  Neck:     Thyroid: No thyroid mass.     Vascular: No carotid bruit.  Cardiovascular:     Rate and Rhythm: Normal rate and regular rhythm.     Pulses: Normal pulses.     Heart sounds: Normal heart sounds. No murmur heard. Pulmonary:     Effort: Pulmonary effort is normal. No respiratory distress.     Breath sounds: Normal breath sounds.  Chest:     Chest wall: No mass.  Breasts:    Right: Normal. No mass or tenderness.     Left: Normal. No mass or tenderness.  Abdominal:     General: Abdomen is flat. Bowel sounds are normal. There is no distension.     Palpations: Abdomen is soft.     Tenderness: There is no abdominal tenderness.  Genitourinary:    Uterus: Normal.      Adnexa: Right adnexa normal and left adnexa normal.       Right: No mass or tenderness.         Left: No mass or tenderness.    Musculoskeletal:        General: No swelling or tenderness. Normal range of motion.     Cervical back: Full passive range of motion without pain, normal range of motion and neck supple.     Right lower leg: No edema.     Left lower leg: No edema.  Skin:    General: Skin is warm and dry.     Capillary Refill: Capillary refill takes less than 2 seconds.  Neurological:     General: No focal deficit present.     Mental Status: She is alert and oriented to person, place, and time.     Cranial Nerves: No cranial nerve deficit.     Sensory: No sensory deficit.  Psychiatric:        Attention and Perception:  Attention normal.        Mood and Affect: Affect is tearful.        Speech: Speech normal.        Behavior: Behavior normal.        Thought Content: Thought content normal.  Cognition and Memory: Cognition normal.        Judgment: Judgment normal.         Assessment And Plan:     1. Annual physical exam - CBC no Diff - CMP14+EGFR - Hemoglobin A1c - Lipid panel - Vitamin D (25 hydroxy)  2. Class 3 severe obesity due to excess calories without serious comorbidity with body mass index (BMI) of 40.0 to 44.9 in adult (HCC) - Phentermine-Topiramate (QSYMIA) 3.75-23 MG CP24; Take 1 tablet by mouth daily.  Dispense: 30 capsule; Refill: 0  3. Vitamin D deficiency - Vitamin D (25 hydroxy)  4. Anxiety - busPIRone (BUSPAR) 5 MG tablet; Take 1 tablet (5 mg total) by mouth daily.  Dispense: 90 tablet; Refill: 1  5. Moderate episode of recurrent major depressive disorder (HCC) - vortioxetine HBr (TRINTELLIX) 10 MG TABS tablet; Take 1 tablet (10 mg total) by mouth daily.  Dispense: 90 tablet; Refill: 1  6. Class 3 severe obesity due to excess calories without serious comorbidity with body mass index (BMI) of 40.0 to 44.9 in adult [facility]) Comments: Weight is stable. Will increase Qsymia to 11.25/69.  Rx sent to her pharmacy. Return in 2 months for weight check - Phentermine-Topiramate (QSYMIA) 3.75-23 MG CP24; Take 1 tablet by mouth daily.  Dispense: 30 capsule; Refill: 0  7. Encounter for screening mammogram for breast cancer - MM Digital Screening; Future     Patient was given opportunity to ask questions. Patient verbalized understanding of the plan and was able to repeat key elements of the plan. All questions were answered to their satisfaction.   Minette Brine, FNP   I, Minette Brine, FNP, have reviewed all documentation for this visit. The documentation on 08/21/21 for the exam, diagnosis, procedures, and orders are all accurate and complete.  THE PATIENT IS ENCOURAGED TO  PRACTICE SOCIAL DISTANCING DUE TO THE COVID-19 PANDEMIC.

## 2021-08-21 NOTE — Patient Instructions (Signed)

## 2021-08-22 ENCOUNTER — Other Ambulatory Visit: Payer: Self-pay | Admitting: Nurse Practitioner

## 2021-08-22 DIAGNOSIS — E559 Vitamin D deficiency, unspecified: Secondary | ICD-10-CM

## 2021-08-22 LAB — CMP14+EGFR
ALT: 10 IU/L (ref 0–32)
AST: 17 IU/L (ref 0–40)
Albumin/Globulin Ratio: 1.3 (ref 1.2–2.2)
Albumin: 4.4 g/dL (ref 3.9–4.9)
Alkaline Phosphatase: 107 IU/L (ref 44–121)
BUN/Creatinine Ratio: 13 (ref 9–23)
BUN: 12 mg/dL (ref 6–20)
Bilirubin Total: 0.7 mg/dL (ref 0.0–1.2)
CO2: 23 mmol/L (ref 20–29)
Calcium: 9.1 mg/dL (ref 8.7–10.2)
Chloride: 103 mmol/L (ref 96–106)
Creatinine, Ser: 0.9 mg/dL (ref 0.57–1.00)
Globulin, Total: 3.3 g/dL (ref 1.5–4.5)
Glucose: 77 mg/dL (ref 70–99)
Potassium: 4.1 mmol/L (ref 3.5–5.2)
Sodium: 139 mmol/L (ref 134–144)
Total Protein: 7.7 g/dL (ref 6.0–8.5)
eGFR: 83 mL/min/{1.73_m2} (ref >59)

## 2021-08-22 LAB — CBC
Hematocrit: 37.8 % (ref 34.0–46.6)
Hemoglobin: 13 g/dL (ref 11.1–15.9)
MCH: 29.5 pg (ref 26.6–33.0)
MCHC: 34.4 g/dL (ref 31.5–35.7)
MCV: 86 fL (ref 79–97)
Platelets: 312 10*3/uL (ref 150–450)
RBC: 4.41 x10E6/uL (ref 3.77–5.28)
RDW: 12.6 % (ref 11.7–15.4)
WBC: 6 10*3/uL (ref 3.4–10.8)

## 2021-08-22 LAB — LIPID PANEL
Chol/HDL Ratio: 2.1 ratio (ref 0.0–4.4)
Cholesterol, Total: 209 mg/dL — ABNORMAL HIGH (ref 100–199)
HDL: 98 mg/dL (ref >39)
LDL Chol Calc (NIH): 101 mg/dL — ABNORMAL HIGH (ref 0–99)
Triglycerides: 53 mg/dL (ref 0–149)
VLDL Cholesterol Cal: 10 mg/dL (ref 5–40)

## 2021-08-22 LAB — HEMOGLOBIN A1C
Est. average glucose Bld gHb Est-mCnc: 103 mg/dL
Hgb A1c MFr Bld: 5.2 % (ref 4.8–5.6)

## 2021-08-22 LAB — VITAMIN D 25 HYDROXY (VIT D DEFICIENCY, FRACTURES): Vit D, 25-Hydroxy: 17.5 ng/mL — ABNORMAL LOW (ref 30.0–100.0)

## 2021-08-22 MED ORDER — VITAMIN D (ERGOCALCIFEROL) 1.25 MG (50000 UNIT) PO CAPS: 50000.0000 [IU] | ORAL_CAPSULE | ORAL | 1 refills | Status: DC

## 2021-08-23 LAB — CERVICOVAGINAL ANCILLARY ONLY
Chlamydia: NEGATIVE
Comment: NEGATIVE
Comment: NORMAL
Neisseria Gonorrhea: NEGATIVE

## 2021-08-24 LAB — CYTOLOGY - PAP
Adequacy: ABSENT
Diagnosis: NEGATIVE

## 2021-08-25 ENCOUNTER — Encounter: Payer: Self-pay | Admitting: Nurse Practitioner

## 2021-08-28 ENCOUNTER — Ambulatory Visit (INDEPENDENT_AMBULATORY_CARE_PROVIDER_SITE_OTHER): Payer: BC Managed Care – PPO | Admitting: Psychology

## 2021-08-28 ENCOUNTER — Telehealth: Payer: Self-pay

## 2021-08-28 DIAGNOSIS — F418 Other specified anxiety disorders: Secondary | ICD-10-CM | POA: Diagnosis not present

## 2021-08-28 DIAGNOSIS — F3289 Other specified depressive episodes: Secondary | ICD-10-CM | POA: Diagnosis not present

## 2021-08-28 NOTE — Telephone Encounter (Signed)
Patient called and notified that her Qsymia (phentermine-topiramate extended release) has been approved from 08/28/2021-11/28/2021. Patient understood.

## 2021-08-28 NOTE — Progress Notes (Signed)
[place] Counselor/Therapist Progress Note  Patient ID: Patricia Boyer, MRN: [id number]    Date: 08/28/21  Time Spent: 4:05  pm - 5:02 pm : 67 Minutes  Treatment Type: Individual Therapy.  Reported Symptoms: Depression and Anxiety  Mental Status Exam: Appearance:  Neat and Well Groomed     Behavior: Appropriate  Motor: Normal  Speech/Language:  Clear and Coherent  Affect: Appropriate  Mood: dysthymic  Thought process: normal  Thought content:   WNL  Sensory/Perceptual disturbances:   WNL  Orientation: oriented to person, place, time/date, and situation  Attention: Good  Concentration: Good  Memory: WNL  Fund of knowledge:  Good  Insight:   Good  Judgment:  Good  Impulse Control: Good   Risk Assessment: Danger to Self:  No Self-injurious Behavior: No Danger to Others: No Duty to Warn:no Physical Aggression / Violence:No  Access to Firearms a concern: No  Gang Involvement:No   Subjective:   Patricia Boyer participated from home, via video, and consented to treatment. Therapist participated from office. We met online due to [place] pandemic. Patricia Boyer reviewed the events of the past week. She noted continued struggles at work with unprofessional treatment, feeling targeted and ignored, and being unfruitful in her attempts to address these concerns professionally. She discussed feeling frustration as she attempts to advocate for self, professionally, and be a productive employee to no avail.  We continued to explore this, boundaries going forward, and ways to not internalize people's opinions of her. Therapist validated and normalized her feelings and provided supportive therapy.    Interventions: Cognitive Behavioral Therapy  Diagnosis:  Other depression  Other specified anxiety disorders  Treatment Plan:  Client Abilities/Strengths Patricia Boyer is intelligent, well educated, and motivated for change.   Support System: Family and friends.   Client  Treatment Preferences Outpatient Therapy  Client Statement of Needs Patricia Boyer would like to work on managing her symptoms, address upcoming adjustments, and process past events. She noted interest in improving her self-care.   Treatment Level Weekly  Symptoms  Depression:  lack of interest, feeling down, insomnia, lethargy, fluctuating appetite, feeling bad about self, difficulty concentrating, muscle tension, weight-gain.    (Status: maintained) Anxiety:  rumination, feeling anxious, difficulty managing worry, difficulty relaxing  (Status: maintained)  Goals:   Patricia Boyer experiences symptoms of depression and anxiety.    Target Date: 10/21/21 Frequency: Weekly  Progress: 0 Modality: individual    Therapist will provide referrals for additional resources as appropriate.  Therapist will provide psycho-education regarding Patricia Boyer's diagnosis and corresponding treatment approaches and interventions. Licensed Clinical Social Worker, [place], LCSW will support the patient's ability to achieve the goals identified. will employ CBT, BA, Problem-solving, Solution Focused, Mindfulness,  coping skills, & other evidenced-based practices will be used to promote progress towards healthy functioning to help manage decrease symptoms associated with her diagnosis.   Reduce overall level, frequency, and intensity of the feelings of depression &  anxiety as evidenced by decreased overall symptoms from 6 to 7 days/week to 0 to 1 days/week per client report for at least 3 consecutive months. Verbally express understanding of the relationship between feelings of depression, anxiety and their impact on thinking patterns and behaviors. Verbalize an understanding of the role that distorted thinking plays in creating fears, excessive worry, and ruminations.  [name] participated in the creation of the treatment plan)   [name], LCSW

## 2021-08-29 ENCOUNTER — Ambulatory Visit
Admission: RE | Admit: 2021-08-29 | Discharge: 2021-08-29 | Disposition: A | Discharge status: Home / Self Care | Payer: BC Managed Care – PPO | Source: Ambulatory Visit | Attending: Nurse Practitioner | Admitting: Nurse Practitioner

## 2021-08-29 DIAGNOSIS — Z1231 Encounter for screening mammogram for malignant neoplasm of breast: Secondary | ICD-10-CM

## 2021-09-01 ENCOUNTER — Other Ambulatory Visit: Payer: Self-pay | Admitting: Nurse Practitioner

## 2021-09-01 DIAGNOSIS — R928 Other abnormal and inconclusive findings on diagnostic imaging of breast: Secondary | ICD-10-CM

## 2021-09-12 ENCOUNTER — Other Ambulatory Visit: Payer: Self-pay | Admitting: Nurse Practitioner

## 2021-09-12 ENCOUNTER — Ambulatory Visit
Admission: RE | Admit: 2021-09-12 | Discharge: 2021-09-12 | Disposition: A | Discharge status: Home / Self Care | Payer: BC Managed Care – PPO | Source: Ambulatory Visit | Attending: Nurse Practitioner | Admitting: Nurse Practitioner

## 2021-09-12 DIAGNOSIS — N6310 Unspecified lump in the right breast, unspecified quadrant: Secondary | ICD-10-CM

## 2021-09-12 DIAGNOSIS — R928 Other abnormal and inconclusive findings on diagnostic imaging of breast: Secondary | ICD-10-CM

## 2021-09-13 ENCOUNTER — Ambulatory Visit
Admission: RE | Admit: 2021-09-13 | Discharge: 2021-09-13 | Disposition: A | Discharge status: Home / Self Care | Payer: BC Managed Care – PPO | Source: Ambulatory Visit | Attending: Nurse Practitioner | Admitting: Nurse Practitioner

## 2021-09-13 DIAGNOSIS — N6310 Unspecified lump in the right breast, unspecified quadrant: Secondary | ICD-10-CM

## 2021-09-14 ENCOUNTER — Ambulatory Visit (INDEPENDENT_AMBULATORY_CARE_PROVIDER_SITE_OTHER): Payer: BC Managed Care – PPO | Admitting: Psychology

## 2021-09-14 DIAGNOSIS — F3289 Other specified depressive episodes: Secondary | ICD-10-CM

## 2021-09-14 DIAGNOSIS — F418 Other specified anxiety disorders: Secondary | ICD-10-CM

## 2021-09-14 NOTE — Progress Notes (Signed)
[facility] Counselor/Therapist Progress Note  Patient ID: Patricia Boyer, MRN: [id number]    Date: 09/14/21  Time Spent: 9:03 am - 10:00 pm : 57 Minutes  Treatment Type: Individual Therapy.  Reported Symptoms: Depression and Anxiety  Mental Status Exam: Appearance:  Neat and Well Groomed     Behavior: Appropriate  Motor: Normal  Speech/Language:  Clear and Coherent  Affect: Appropriate  Mood: dysthymic  Thought process: normal  Thought content:   WNL  Sensory/Perceptual disturbances:   WNL  Orientation: oriented to person, place, time/date, and situation  Attention: Good  Concentration: Good  Memory: WNL  Fund of knowledge:  Good  Insight:   Good  Judgment:  Good  Impulse Control: Good   Risk Assessment: Danger to Self:  No Self-injurious Behavior: No Danger to Others: No Duty to Warn:no Physical Aggression / Violence:No  Access to Firearms a concern: No  Gang Involvement:No   Subjective:   Patricia Boyer participated from home, via video, and consented to treatment. Therapist participated from home office. We met online due to [place] pandemic. Patricia Boyer reviewed the events of the past week. She discussed a need to accept her supervisors behaviors, specifically poor communication, targeting behavior, and consistent negative feedback. She noted her attempts to document her supervisors' behavior. We explored ways to  manage her mood, set boundaries with self and others, and communicating assertively as she maintains her continued professionalism. We discussed ways to set boundaries regarding rumination. She was engaged and motivated during these session and expressed commitment towards goals. Follow-ups were scheduled for continued treatment.   Interventions: Cognitive Behavioral Therapy  Diagnosis:  Other depression  Other specified anxiety disorders  Treatment Plan:  Client Abilities/Strengths Patricia Boyer is intelligent, well educated, and motivated for  change.   Support System: Family and friends.   Client Treatment Preferences Outpatient Therapy  Client Statement of Needs Patricia Boyer would like to work on managing her symptoms, address upcoming adjustments, and process past events. She noted interest in improving her self-care.   Treatment Level Weekly  Symptoms  Depression:  lack of interest, feeling down, insomnia, lethargy, fluctuating appetite, feeling bad about self, difficulty concentrating, muscle tension, weight-gain.    (Status: maintained) Anxiety:  rumination, feeling anxious, difficulty managing worry, difficulty relaxing  (Status: maintained)  Goals:   Patricia Boyer experiences symptoms of depression and anxiety.    Target Date: 10/21/21 Frequency: Weekly  Progress: 0 Modality: individual    Therapist will provide referrals for additional resources as appropriate.  Therapist will provide psycho-education regarding Patricia Boyer's diagnosis and corresponding treatment approaches and interventions. Licensed Clinical Social Worker, [place], LCSW will support the patient's ability to achieve the goals identified. will employ CBT, BA, Problem-solving, Solution Focused, Mindfulness,  coping skills, & other evidenced-based practices will be used to promote progress towards healthy functioning to help manage decrease symptoms associated with her diagnosis.   Reduce overall level, frequency, and intensity of the feelings of depression &  anxiety as evidenced by decreased overall symptoms from 6 to 7 days/week to 0 to 1 days/week per client report for at least 3 consecutive months. Verbally express understanding of the relationship between feelings of depression, anxiety and their impact on thinking patterns and behaviors. Verbalize an understanding of the role that distorted thinking plays in creating fears, excessive worry, and ruminations.  [name] participated in the creation of the treatment plan)   [name], LCSW

## 2021-09-29 ENCOUNTER — Ambulatory Visit (INDEPENDENT_AMBULATORY_CARE_PROVIDER_SITE_OTHER): Payer: BC Managed Care – PPO | Admitting: Psychology

## 2021-09-29 DIAGNOSIS — F3289 Other specified depressive episodes: Secondary | ICD-10-CM | POA: Diagnosis not present

## 2021-09-29 NOTE — Progress Notes (Signed)
[facility] Counselor/Therapist Progress Note  Patient ID: Patricia Boyer, MRN: [id number]    Date: 09/29/21  Time Spent: 1:12 pm - 2:04 pm : 52 Minutes  Treatment Type: Individual Therapy.  Reported Symptoms: Depression and Anxiety  Mental Status Exam: Appearance:  Neat and Well Groomed     Behavior: Appropriate  Motor: Normal  Speech/Language:  Clear and Coherent  Affect: Appropriate  Mood: dysthymic  Thought process: normal  Thought content:   WNL  Sensory/Perceptual disturbances:   WNL  Orientation: oriented to person, place, time/date, and situation  Attention: Good  Concentration: Good  Memory: WNL  Fund of knowledge:  Good  Insight:   Good  Judgment:  Good  Impulse Control: Good   Risk Assessment: Danger to Self:  No Self-injurious Behavior: No Danger to Others: No Duty to Warn:no Physical Aggression / Violence:No  Access to Firearms a concern: No  Gang Involvement:No   Subjective:   [place] participated from home, via video, and consented to treatment. Therapist participated from home office. We met online due to [place] pandemic. [name] reviewed the events of the past week. She noted continued work related stressors and getting more frustrated. She discussed feeling targeted by her admin and noted numerous mixed messages. [name] discussed how this affects her overall mood and the stress related to. We worked on identifying how she has been dealing with these stressors and ways to set boundaries, express self assertively, maintain accountability for self and others, and not personalize people's feedback without evidence. Therapist modeled these tools during the session and validated [name]'s feelings and experience. [name] was engaged and motivated during the session. She expressed commitment towards goals. Follow-ups were scheduled for continued treatment.   Interventions: Cognitive Behavioral Therapy, assertiveness, boundary setting.    Diagnosis:  Other depression  Treatment Plan:  Client Abilities/Strengths [name] is intelligent, well educated, and motivated for change.   Support System: Family and friends.   Client Treatment Preferences Outpatient Therapy  Client Statement of Needs [name] would like to work on managing her symptoms, address upcoming adjustments, and process past events. She noted interest in improving her self-care.   Treatment Level Weekly  Symptoms  Depression:  lack of interest, feeling down, insomnia, lethargy, fluctuating appetite, feeling bad about self, difficulty concentrating, muscle tension, weight-gain.    (Status: maintained) Anxiety:  rumination, feeling anxious, difficulty managing worry, difficulty relaxing  (Status: maintained)  Goals:   [name] experiences symptoms of depression and anxiety.    Target Date: 10/21/21 Frequency: Weekly  Progress: 0 Modality: individual    Therapist will provide referrals for additional resources as appropriate.  Therapist will provide psycho-education regarding [name]'s diagnosis and corresponding treatment approaches and interventions. Licensed Clinical Social Worker, [place], LCSW will support the patient's ability to achieve the goals identified. will employ CBT, BA, Problem-solving, Solution Focused, Mindfulness,  coping skills, & other evidenced-based practices will be used to promote progress towards healthy functioning to help manage decrease symptoms associated with her diagnosis.   Reduce overall level, frequency, and intensity of the feelings of depression &  anxiety as evidenced by decreased overall symptoms from 6 to 7 days/week to 0 to 1 days/week per client report for at least 3 consecutive months. Verbally express understanding of the relationship between feelings of depression, anxiety and their impact on thinking patterns and behaviors. Verbalize an understanding of the role that distorted thinking plays in creating  fears, excessive worry, and ruminations.  [name] participated in the creation of the  treatment plan)   [name], LCSW

## 2021-10-13 ENCOUNTER — Ambulatory Visit (INDEPENDENT_AMBULATORY_CARE_PROVIDER_SITE_OTHER): Payer: BC Managed Care – PPO | Admitting: Psychology

## 2021-10-13 DIAGNOSIS — F3289 Other specified depressive episodes: Secondary | ICD-10-CM

## 2021-10-13 DIAGNOSIS — F418 Other specified anxiety disorders: Secondary | ICD-10-CM | POA: Diagnosis not present

## 2021-10-13 NOTE — Progress Notes (Signed)
[facility] Counselor/Therapist Progress Note  Patient ID: PAMI WOOL, MRN: [id number]    Date: 10/13/21  Time Spent: 10:39 am - 11:31 am : 52 Minutes  Treatment Type: Individual Therapy.  Reported Symptoms: Depression and Anxiety  Mental Status Exam: Appearance:  Neat and Well Groomed     Behavior: Appropriate  Motor: Normal  Speech/Language:  Clear and Coherent  Affect: Appropriate  Mood: normal  Thought process: normal  Thought content:   WNL  Sensory/Perceptual disturbances:   WNL  Orientation: oriented to person, place, time/date, and situation  Attention: Good  Concentration: Good  Memory: WNL  Fund of knowledge:  Good  Insight:   Good  Judgment:  Good  Impulse Control: Good   Risk Assessment: Danger to Self:  No Self-injurious Behavior: No Danger to Others: No Duty to Warn:no Physical Aggression / Violence:No  Access to Firearms a concern: No  Gang Involvement:No   Subjective:   Delton Prairie participated from home, via video, and consented to treatment. Therapist participated from home office. We met online due to [place] pandemic. Tiena reviewed the events of the past week. Aranza noted recently buying a home and noted this needing some work. She noted her attempts to use healthful distractions. Adena noted strain with her boyfriend as she asked him to move into her home. Rimsha noted having an interview for a job and being hopeful that this will translate to a new employment opportunity.  Therapist praised [place] for working on focusing on positives in her life.  Additionally therapist praised [facility] for her efforts in not personalizing people's feedback without evidence.  She discussed interpersonal stressors with her father due to the sale of the home and expectations regarding finances.  We explored this during the session.  Teressa Senter noted her father having history of controlling behavior which we processed during the session therapist  encouraged to get his identify boundaries going forward as we processed during our follow-up.  Joellen Jersey was engaged and motivated during the session and expressed commitment towards her goals.  Therapist praised [place] and provided supportive therapy.  Follow-up was scheduled for continued treatment.     She noted continued work related stressors and getting more frustrated. She discussed feeling targeted by her admin and noted numerous mixed messages. Yoneko discussed how this affects her overall mood and the stress related to. We worked on identifying how she has been dealing with these stressors and ways to set boundaries, express self assertively, maintain accountability for self and others, and not personalize people's feedback without evidence. Therapist modeled these tools during the session and validated Meghann's feelings and experience. Jeniya was engaged and motivated during the session. She expressed commitment towards goals. Follow-ups were scheduled for continued treatment.   Interventions: Cognitive Behavioral Therapy, assertiveness, boundary setting.   Diagnosis:  Other depression  Other specified anxiety disorders  Treatment Plan:  Client Abilities/Strengths Taralyn is intelligent, well educated, and motivated for change.   Support System: Family and friends.   Client Treatment Preferences Outpatient Therapy  Client Statement of Needs Lynnel would like to work on managing her symptoms, address upcoming adjustments, and process past events. She noted interest in improving her self-care.   Treatment Level Weekly  Symptoms  Depression:  lack of interest, feeling down, insomnia, lethargy, fluctuating appetite, feeling bad about self, difficulty concentrating, muscle tension, weight-gain.    (Status: maintained) Anxiety:  rumination, feeling anxious, difficulty managing worry, difficulty relaxing  (Status: maintained)  Goals:   [place]ri experiences symptoms  of depression and  anxiety.    Target Date: 10/21/21 Frequency: Weekly  Progress: 0 Modality: individual    Therapist will provide referrals for additional resources as appropriate.  Therapist will provide psycho-education regarding Lawson's diagnosis and corresponding treatment approaches and interventions. Licensed Clinical Social Worker, [place], LCSW will support the patient's ability to achieve the goals identified. will employ CBT, BA, Problem-solving, Solution Focused, Mindfulness,  coping skills, & other evidenced-based practices will be used to promote progress towards healthy functioning to help manage decrease symptoms associated with her diagnosis.   Reduce overall level, frequency, and intensity of the feelings of depression &  anxiety as evidenced by decreased overall symptoms from 6 to 7 days/week to 0 to 1 days/week per client report for at least 3 consecutive months. Verbally express understanding of the relationship between feelings of depression, anxiety and their impact on thinking patterns and behaviors. Verbalize an understanding of the role that distorted thinking plays in creating fears, excessive worry, and ruminations.  [name] participated in the creation of the treatment plan)   [name], LCSW

## 2021-10-26 ENCOUNTER — Encounter: Payer: Self-pay | Admitting: Nurse Practitioner

## 2021-10-26 ENCOUNTER — Ambulatory Visit: Payer: BC Managed Care – PPO | Admitting: Nurse Practitioner

## 2021-10-26 VITALS — BP 128/70 | HR 87 | Temp 98.4°F | Ht 68.0 in | Wt 261.0 lb

## 2021-10-26 DIAGNOSIS — Z6839 Body mass index (BMI) 39.0-39.9, adult: Secondary | ICD-10-CM | POA: Diagnosis not present

## 2021-10-26 DIAGNOSIS — Z2821 Immunization not carried out because of patient refusal: Secondary | ICD-10-CM

## 2021-10-26 DIAGNOSIS — L659 Nonscarring hair loss, unspecified: Secondary | ICD-10-CM | POA: Diagnosis not present

## 2021-10-26 DIAGNOSIS — E6609 Other obesity due to excess calories: Secondary | ICD-10-CM | POA: Diagnosis not present

## 2021-10-26 MED ORDER — PHENTERMINE-TOPIRAMATE ER 7.5-46 MG PO CP24: 1.0000 | ORAL_CAPSULE | Freq: Every day | ORAL | 1 refills | Status: DC

## 2021-10-26 NOTE — Progress Notes (Signed)
I,Tianna Badgett,acting as a Neurosurgeon for [employer], FNP.,have documented all relevant documentation on the behalf of [name], FNP,as directed by  [name], FNP while in the presence of [name], FNP.  Subjective:     Patient ID: Patricia Boyer , female    DOB: [birth year] , 41 y.o.   MRN: [id number]   Chief Complaint  Patient presents with   Obesity    HPI  Patient presents today for weight check. She is on Qsymia and doing well, tolerating well. She is cutting back on her meet. She is using the fasting app and does not eat after 8p, tracks her water. She is walking when she can if rains she tries to go back out. She is in the process of moving and hurt her back. She is trying to cook more at home. She is trying to decrease her intake of sodas.   Wt Readings from Last 3 Encounters: 10/26/21 : 261 lb (118.4 kg) 08/21/21 : 271 lb 3.2 oz (123 kg) 02/28/21 : 266 lb 3.2 oz (120.7 kg)       Past Medical History:  Diagnosis Date   Anemia    Arthritis    Cataract    GERD (gastroesophageal reflux disease)      Family History  Problem Relation Age of Onset   Hypertension Mother    Sleep apnea Mother      Current Outpatient Medications:    busPIRone (BUSPAR) 5 MG tablet, Take 1 tablet (5 mg total) by mouth daily., Disp: 90 tablet, Rfl: 1   Phentermine-Topiramate 7.5-46 MG CP24, Take 1 tablet by mouth daily., Disp: 30 capsule, Rfl: 1   Vitamin D, Ergocalciferol, (DRISDOL) 1.25 MG (50000 UNIT) CAPS capsule, Take 1 capsule (50,000 Units total) by mouth every 7 (seven) days., Disp: 12 capsule, Rfl: 1   vortioxetine HBr (TRINTELLIX) 10 MG TABS tablet, Take 1 tablet (10 mg total) by mouth daily., Disp: 90 tablet, Rfl: 1   No Known Allergies   Review of Systems  Constitutional: Negative.   Respiratory: Negative.    Cardiovascular: Negative.   Gastrointestinal: Negative.   Neurological: Negative.      Today's Vitals   10/26/21 0954  BP: 128/70  Pulse: 87   Temp: 98.4 F (36.9 C)  TempSrc: Oral  Weight: 261 lb (118.4 kg)  Height: 5\' 8"  (1.727 m)   Body mass index is 39.68 kg/m.   Objective:  Physical Exam Vitals reviewed.  Constitutional:      General: She is not in acute distress.    Appearance: Normal appearance.  Cardiovascular:     Rate and Rhythm: Normal rate and regular rhythm.     Pulses: Normal pulses.     Heart sounds: Normal heart sounds. No murmur heard. Skin:    General: Skin is warm and dry.  Neurological:     General: No focal deficit present.     Mental Status: She is alert and oriented to person, place, and time.     Cranial Nerves: No cranial nerve deficit.     Motor: No weakness.  Psychiatric:        Mood and Affect: Mood normal.        Behavior: Behavior normal.        Thought Content: Thought content normal.        Judgment: Judgment normal.         Assessment And Plan:     1. Hair loss Comments: She has not been to Dermatology was  unable to get an appt. Will refer to Dermatology in [facility].  - Ambulatory referral to Dermatology  2. Class 2 obesity due to excess calories without serious comorbidity with body mass index (BMI) of 39.0 to 39.9 in adult Comments: She is doing well with Qsymia, congratulated on her weight loss. Continue focusing on healthy diet and goal to increase physical activity - Phentermine-Topiramate 7.5-46 MG CP24; Take 1 tablet by mouth daily.  Dispense: 30 capsule; Refill: 1  3. Influenza vaccination declined Patient declined influenza vaccination at this time. Patient is aware that influenza vaccine prevents illness in 70% of healthy people, and reduces hospitalizations to 30-70% in elderly. This vaccine is recommended annually. Education has been provided regarding the importance of this vaccine but patient still declined. Advised may receive this vaccine at local pharmacy or Health Dept.or vaccine clinic. Aware to provide a copy of the vaccination record if obtained from local  pharmacy or Health Dept.  Pt is willing to accept risk associated with refusing vaccination.    Patient was given opportunity to ask questions. Patient verbalized understanding of the plan and was able to repeat key elements of the plan. All questions were answered to their satisfaction.  [name], FNP   I, [name], FNP, have reviewed all documentation for this visit. The documentation on 10/26/21 for the exam, diagnosis, procedures, and orders are all accurate and complete.   IF YOU HAVE BEEN REFERRED TO A SPECIALIST, IT MAY TAKE 1-2 WEEKS TO SCHEDULE/PROCESS THE REFERRAL. IF YOU HAVE NOT HEARD FROM US/SPECIALIST IN TWO WEEKS, PLEASE GIVE [place] A CALL AT [phone] X [phone].   THE PATIENT IS ENCOURAGED TO PRACTICE SOCIAL DISTANCING DUE TO THE COVID-19 PANDEMIC.

## 2021-10-27 ENCOUNTER — Ambulatory Visit: Payer: BC Managed Care – PPO | Admitting: Psychology

## 2021-11-04 DIAGNOSIS — L659 Nonscarring hair loss, unspecified: Secondary | ICD-10-CM | POA: Insufficient documentation

## 2021-11-10 ENCOUNTER — Ambulatory Visit (INDEPENDENT_AMBULATORY_CARE_PROVIDER_SITE_OTHER): Payer: BC Managed Care – PPO | Admitting: Psychology

## 2021-11-10 DIAGNOSIS — F418 Other specified anxiety disorders: Secondary | ICD-10-CM | POA: Diagnosis not present

## 2021-11-10 DIAGNOSIS — F3289 Other specified depressive episodes: Secondary | ICD-10-CM | POA: Diagnosis not present

## 2021-11-10 NOTE — Progress Notes (Signed)
Patricia Boyer Counselor/Therapist Progress Note  Patient ID: Patricia Boyer, MRN: [id number]    Date: 11/10/21  Time Spent: 3:05 pm - 3:59 pm : 54 Minutes  Treatment Type: Individual Therapy.  Reported Symptoms: Depression and Anxiety  Mental Status Exam: Appearance:  Neat and Well Groomed     Behavior: Appropriate  Motor: Normal  Speech/Language:  Clear and Coherent  Affect: Appropriate  Mood: normal  Thought process: normal  Thought content:   WNL  Sensory/Perceptual disturbances:   WNL  Orientation: oriented to person, place, time/date, and situation  Attention: Good  Concentration: Good  Memory: WNL  Fund of knowledge:  Good  Insight:   Good  Judgment:  Good  Impulse Control: Good   Risk Assessment: Danger to Self:  No Self-injurious Behavior: No Danger to Others: No Duty to Warn:no Physical Aggression / Violence:No  Access to Firearms a concern: No  Gang Involvement:No   Subjective:   Patricia Boyer participated from home, via video, and consented to treatment. Therapist participated from home office. We met online due to [place] pandemic. Patricia Boyer reviewed the events of the past week. Patricia Boyer noted being transferred to a different position at her job but noted confusion and lack of follow through on the administrative tasks related to. She noted frustration regarding the lack of follow through. She noted a rise in anxiety and overall somatic complaints including nausea. She noted difficulty concentrating due to environmental factors. She noted her attempts to advocate for self and sensing some hesitation from her bos to procure the necessary resources. We discussed ways to communicate needs assertively and diplomatically. She noted experiencing self-doubt regarding asking for resources, holding others accountable, and communicating needs. We worked on identifying positives, recognizing progress, thinking positively going forward. Therapist encouraged Patricia Boyer to  be mindful of the varying differences between her previous position and new position and differentiate between the varying stressors. Therapist encouraged boundaries and focus outside of work related stressors. Patricia Boyer was engaged and motivated during the session and expressed commitment towards her goals.  Therapist praised Patricia Boyer and provided supportive therapy.  Follow-up was scheduled for continued treatment.  Interventions: Cognitive Behavioral Therapy, assertiveness, boundary setting.   Diagnosis:  Other depression  Other specified anxiety disorders  Treatment Plan:  Client Abilities/Strengths Patricia Boyer is intelligent, well educated, and motivated for change.   Support System: Family and friends.   Client Treatment Preferences Outpatient Therapy  Client Statement of Needs Patricia Boyer would like to work on managing her symptoms, address upcoming adjustments, and process past events. She noted interest in improving her self-care.   Treatment Level Weekly  Symptoms  Depression:  lack of interest, feeling down, insomnia, lethargy, fluctuating appetite, feeling bad about self, difficulty concentrating, muscle tension, weight-Boyer.    (Status: maintained) Anxiety:  rumination, feeling anxious, difficulty managing worry, difficulty relaxing  (Status: maintained)  Goals:   Patricia Boyer experiences symptoms of depression and anxiety.    Target Date: 11/20/21 Frequency: Weekly  Progress: 0 Modality: individual    Therapist will provide referrals for additional resources as appropriate.  Therapist will provide psycho-education regarding Patricia Boyer's diagnosis and corresponding treatment approaches and interventions. Licensed Clinical Social Worker, [place], LCSW will support the patient's ability to achieve the goals identified. will employ CBT, BA, Problem-solving, Solution Focused, Mindfulness,  coping skills, & other evidenced-based practices will be used to promote progress towards healthy  functioning to help manage decrease symptoms associated with her diagnosis.   Reduce overall level, frequency, and intensity of the feelings  of depression &  anxiety as evidenced by decreased overall symptoms from 6 to 7 days/week to 0 to 1 days/week per client report for at least 3 consecutive months. Verbally express understanding of the relationship between feelings of depression, anxiety and their impact on thinking patterns and behaviors. Verbalize an understanding of the role that distorted thinking plays in creating fears, excessive worry, and ruminations.  Patricia Boyer participated in the creation of the treatment plan)   [name], LCSW

## 2021-11-27 ENCOUNTER — Ambulatory Visit: Payer: BC Managed Care – PPO | Admitting: Psychology

## 2021-11-27 DIAGNOSIS — F418 Other specified anxiety disorders: Secondary | ICD-10-CM | POA: Diagnosis not present

## 2021-11-27 DIAGNOSIS — F3289 Other specified depressive episodes: Secondary | ICD-10-CM | POA: Diagnosis not present

## 2021-11-27 NOTE — Progress Notes (Signed)
Comprehensive Clinical Assessment (CCA) Note  11/27/2021 Patricia Boyer [id number]  Time Spent: 4:33  pm - 5:32 pm: 48 Minutes  Chief Complaint: No chief complaint on file.  Visit Diagnosis: depression and anxiety   Guardian/Payee:  self    Paperwork requested: No   Reason for Visit /Presenting Problem: Anxiety and Depression  Mental Status Exam: Appearance:   Casual     Behavior:  Appropriate  Motor:  Normal  Speech/Language:   Clear and Coherent  Affect:  Appropriate  Mood:  anxious  Thought process:  normal  Thought content:    WNL  Sensory/Perceptual disturbances:    WNL  Orientation:  oriented to person, place, time/date, and situation  Attention:  Good  Concentration:  Good  Memory:  WNL  Fund of knowledge:   Good  Insight:    Good  Judgment:   Good  Impulse Control:  Good    Reported Symptoms:  Depression and anxiety.   Risk Assessment: Danger to Self:  No Self-injurious Behavior: No Danger to Others: No Duty to Warn:no Physical Aggression / Violence:No  Access to Firearms a concern: No  Gang Involvement:No  Patient / guardian was educated about steps to take if suicide or homicide risk level increases between visits: no While future psychiatric events cannot be accurately predicted, the patient does not currently require acute inpatient psychiatric care and does not currently meet [facility] involuntary commitment criteria.  Substance Abuse History: Current substance abuse: No     Caffeine: 1x-2x coffee and 1x energy drink.  Tobacco: Denied.  Alcohol: Occasional 1x-2x and or a beer at home.  Marijuana use: Denied.   Past Psychiatric History:   Previous psychological history is significant for anxiety and depression Outpatient Providers:Sarann Tregre, LCSW History of Psych Hospitalization: No  Psychological Testing:  NA    Abuse History:  Victim of: No., emotional: Father   Report needed: No. Victim of Neglect:No. Perpetrator of  NA    Witness / Exposure to Domestic Violence: No   Protective Services Involvement: No  Witness to [employer] Violence:  No   Family History:  Family History  Problem Relation Age of Onset   Hypertension Mother    Sleep apnea Mother     Living situation: the patient lives with boyfriend.   Sexual Orientation: Straight  Relationship Status: co-habitating  Name of spouse / other:  Cristie Hem (4 years) If a parent, number of children / ages:na  Support Systems: spouse friends parents  Financial Stress:  Yes , she noted her work cutting her pay by $1000 dollars a month.  Income/Employment/Disability: Employment  Armed forces logistics/support/administrative officer: No   Educational History: Education: post Forensic psychologist work or degree, PHD.  Religion/Sprituality/World View: [name]  Any cultural differences that may affect / interfere with treatment:  not applicable   Recreation/Hobbies: walkikng  Stressors: Financial difficulties   Health problems   Occupational concerns    Strengths: Family and Friends  Barriers:  Mood and work stressors.    Legal History: Pending legal issue / charges: The patient has no significant history of legal issues. History of legal issue / charges:  na  Medical History/Surgical History: reviewed Past Medical History:  Diagnosis Date   Anemia    Arthritis    Cataract    GERD (gastroesophageal reflux disease)     No past surgical history on file.  Medications: Current Outpatient Medications  Medication Sig Dispense Refill   busPIRone (BUSPAR) 5 MG tablet Take 1 tablet (5 mg total) by mouth  daily. 90 tablet 1   Phentermine-Topiramate 7.5-46 MG CP24 Take 1 tablet by mouth daily. 30 capsule 1   Vitamin D, Ergocalciferol, (DRISDOL) 1.25 MG (50000 UNIT) CAPS capsule Take 1 capsule (50,000 Units total) by mouth every 7 (seven) days. 12 capsule 1   vortioxetine HBr (TRINTELLIX) 10 MG TABS tablet Take 1 tablet (10 mg total) by mouth daily. 90 tablet 1   No current  facility-administered medications for this visit.    No Known Allergies  Diagnoses:  Other depression  Other specified anxiety disorders  Plan of Care: Continued OPT.   Narrative:   Delton Prairie participated from home, via video, and consented to treatment. Therapist participated from office. We met online due to [place] pandemic. We reviewed the limits of confidentiality prior to the start of the evaluation and Nelline consented to proceed. Arsema is a current patient and we are completing an annual reassessment. Salvatrice continues to be prescribed her psychotropic medication by Minette Brine, FNP. She is currently being prescribed Trinteellix 43m qd and buspar 59mqd. She would benefit from a psychiatric consult and resources will during the follow-ups. She noted numerous recent transitions including purchasing a new home, having her significant other move in, continued work related stressors, financial stressors, a recent reduction in pay without explanation, and the effect of these stressors on her mood. She noted the purchase of her home and the effects on her budget and discussed a recent decrease in her pay, at her job, without getting a cogent explanation of this decrease. She noted having her offer letter that reflects her current pay which is also in her contact. She noted her attempts to resolve this to no avail. She discussed her attempts to get this in writing but this has not come to fruition. She noted continued and significant work related stressors that have affected her mood negatively. She noted experiencing weight-gain due to poor eating, increased alcohol consumption, and hair-loss. 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 We completed the GAD7 and PHQ-9  during the session. She noted difficulty completing tasks and concentrating.       11/27/2021    5:25 PM 05/26/2020    3:16 PM  GAD 7 : Generalized Anxiety Score  Nervous, Anxious, on Edge 1 1  Control/stop worrying 3 1  Worry too much - different things 3 1  Trouble relaxing 3 1  Restless 1 2  Easily annoyed or irritable 1 2  Afraid - awful might happen 1 1  Total GAD 7 Score 13 9  Anxiety Difficulty Somewhat difficult Somewhat difficult   Flowsheet Row Counselor from 11/27/2021 in [place] Total Score 16       .  OsBuena IrishLCSW

## 2021-12-12 ENCOUNTER — Ambulatory Visit (INDEPENDENT_AMBULATORY_CARE_PROVIDER_SITE_OTHER): Payer: BC Managed Care – PPO | Admitting: Psychology

## 2021-12-12 DIAGNOSIS — F418 Other specified anxiety disorders: Secondary | ICD-10-CM

## 2021-12-12 DIAGNOSIS — F3289 Other specified depressive episodes: Secondary | ICD-10-CM

## 2021-12-12 NOTE — Progress Notes (Signed)
[facility] Counselor/Therapist Progress Note  Patient ID: Patricia Boyer, MRN: [id number]   Date: 12/12/21  Time Spent: 8:07  am - 9:05 am : 65 Minutes  Treatment Type: Individual Therapy.  Reported Symptoms: Depression and Anxiety.   Mental Status Exam: Appearance:  Casual     Behavior: Appropriate  Motor: Normal  Speech/Language:  Clear and Coherent  Affect: Congruent  Mood: anxious and depressed  Thought process: normal  Thought content:   WNL  Sensory/Perceptual disturbances:   WNL  Orientation: oriented to person, place, time/date, and situation  Attention: Good  Concentration: Good  Memory: WNL  Fund of knowledge:  Good  Insight:   Good  Judgment:  Good  Impulse Control: Good   Risk Assessment: Danger to Self:  No Self-injurious Behavior: No Danger to Others: No Duty to Warn:no Physical Aggression / Violence:No  Access to Firearms a concern: No  Gang Involvement:No   Subjective:   Patricia Boyer participated from home, via video and consented to treatment. Therapist participated from office. We met online due to [place] pandemic. Patricia Boyer reviewed the events of the past week. We reviewed numerous treatment approaches including CBT, BA, Problem Solving, and Solution focused therapy. Psych-education regarding the Patricia Boyer's diagnosis of Other depression  Other specified anxiety disorders was provided during the session. We discussed Patricia Boyer's goals treatment goals which include manage symptoms, process past events, maintain work and home-life balance. Patricia Boyer provided verbal approval of the treatment plan.   Interventions: Psycho-education & Goal Setting.   Diagnosis:  Other depression  Other specified anxiety disorders   Treatment Plan:  Client Abilities/Strengths Patricia Boyer is intelligent, highly educated, and motivated for change.   Support System: Family and friends.   Client Treatment Preferences Outpatient  Therapy  Client Statement of Needs Patricia Boyer would like to manage her symptoms, process past events, improve her mood, address current stressors   Treatment Level Weekly  Symptoms  Depression:  loss of interest, feeling depressed and hopeless, hypersomnia & middle insomnia, feeling bad about self, over eating, feeling bad about self, trouble concentrating, psychomotor agitation.  (Status: maintained) Anxiety: Feeling anxious, difficulty managing worry, worrying about different things, trouble relaxing, restlessness, irritability, feeling afraid something awful might happen  (Status: maintained)  Goals:   Patricia Boyer experiences symptoms of depression and anxiety.    Target Date: 12/13/22 Frequency: Weekly  Progress: 0 Modality: individual    Therapist will provide referrals for additional resources as appropriate.  Therapist will provide psycho-education regarding Patricia Boyer's diagnosis and corresponding treatment approaches and interventions. Licensed Clinical Social Worker, [place], LCSW will support the patient's ability to achieve the goals identified. will employ CBT, BA, Problem-solving, Solution Focused, Mindfulness,  coping skills, & other evidenced-based practices will be used to promote progress towards healthy functioning to help manage decrease symptoms associated with her diagnosis.   Reduce overall level, frequency, and intensity of the feelings of depression, anxiety and panic evidenced by decreased overall symptoms from 6 to 7 days/week to 0 to 1 days/week per client report for at least 3 consecutive months. Verbally express understanding of the relationship between feelings of depression, anxiety and their impact on thinking patterns and behaviors. Verbalize an understanding of the role that distorted thinking plays in creating fears, excessive worry, and ruminations.    Patricia Boyer participated in the creation of the treatment plan)    Patricia Irish, LCSW

## 2021-12-22 ENCOUNTER — Ambulatory Visit: Payer: BC Managed Care – PPO | Admitting: Psychology

## 2021-12-27 ENCOUNTER — Encounter: Payer: Self-pay | Admitting: Nurse Practitioner

## 2021-12-28 ENCOUNTER — Ambulatory Visit: Payer: BC Managed Care – PPO | Admitting: Nurse Practitioner

## 2022-01-01 ENCOUNTER — Ambulatory Visit (INDEPENDENT_AMBULATORY_CARE_PROVIDER_SITE_OTHER): Payer: BC Managed Care – PPO | Admitting: Psychology

## 2022-01-01 DIAGNOSIS — F3289 Other specified depressive episodes: Secondary | ICD-10-CM | POA: Diagnosis not present

## 2022-01-01 DIAGNOSIS — F418 Other specified anxiety disorders: Secondary | ICD-10-CM

## 2022-01-01 NOTE — Progress Notes (Signed)
[facility] Counselor/Therapist Progress Note  Patient ID: Patricia Boyer, MRN: [id number]   Date: 01/01/22  Time Spent: 8:03  am - 8:58 am : 63 Minutes  Treatment Type: Individual Therapy.  Reported Symptoms: Depression and Anxiety.   Mental Status Exam: Appearance:  Casual     Behavior: Appropriate  Motor: Normal  Speech/Language:  Clear and Coherent  Affect: Congruent  Mood: anxious and depressed  Thought process: normal  Thought content:   WNL  Sensory/Perceptual disturbances:   WNL  Orientation: oriented to person, place, time/date, and situation  Attention: Good  Concentration: Good  Memory: WNL  Fund of knowledge:  Good  Insight:   Good  Judgment:  Good  Impulse Control: Good   Risk Assessment: Danger to Self:  No Self-injurious Behavior: No Danger to Others: No Duty to Warn:no Physical Aggression / Violence:No  Access to Firearms a concern: No  Gang Involvement:No   Subjective:   Patricia Boyer participated from home, via video and consented to treatment. Therapist participated from office. We met online due to [place] pandemic. Patricia Boyer noted feeling "yoyo'ed" a bit due to the push and pull of work. We explored this during the session and the effect of this on her mood and outlook.  Patricia Boyer discussed feeling a lack of control, which we processed during the session.  Worked identifying ways to assert control and healthy in a positive way.  We discussed possible resources and problem solving to address this.  Therapist encouraged he would identify areas of control, within healthy and mood congruent avenue, to pursue while taking breaks and setting boundaries over negative rumination.  Therapist about this during session.  We discussed the use of healthy distractions, more positive avenues to be more productive, and to take breaks.  Therapist about this as well, during the session.  She was  tearful during the session and work towards processing this.   Therapist validated and normalized that he was going to experience and provided supportive therapy.  Follow-up was scheduled for continued treatment.  Interventions: CBT & problem-solving.   Diagnosis:  Other depression  Other specified anxiety disorders   Treatment Plan:  Client Abilities/Strengths Patricia Boyer is intelligent, highly educated, and motivated for change.   Support System: Family and friends.   Client Treatment Preferences Outpatient Therapy  Client Statement of Needs Patricia Boyer would like to manage her symptoms, process past events, improve her mood, address current stressors   Treatment Level Weekly  Symptoms  Depression:  loss of interest, feeling depressed and hopeless, hypersomnia & middle insomnia, feeling bad about self, over eating, feeling bad about self, trouble concentrating, psychomotor agitation.  (Status: maintained) Anxiety: Feeling anxious, difficulty managing worry, worrying about different things, trouble relaxing, restlessness, irritability, feeling afraid something awful might happen  (Status: maintained)  Goals:   Patricia Boyer experiences symptoms of depression and anxiety.    Target Date: 12/13/22 Frequency: Weekly  Progress: 0 Modality: individual    Therapist will provide referrals for additional resources as appropriate.  Therapist will provide psycho-education regarding Patricia Boyer's diagnosis and corresponding treatment approaches and interventions. Licensed Clinical Social Worker, [place], LCSW will support the patient's ability to achieve the goals identified. will employ CBT, BA, Problem-solving, Solution Focused, Mindfulness,  coping skills, & other evidenced-based practices will be used to promote progress towards healthy functioning to help manage decrease symptoms associated with her diagnosis.   Reduce overall level, frequency, and intensity of the feelings of depression, anxiety and panic evidenced by decreased overall symptoms from  6 to  7 days/week to 0 to 1 days/week per client report for at least 3 consecutive months. Verbally express understanding of the relationship between feelings of depression, anxiety and their impact on thinking patterns and behaviors. Verbalize an understanding of the role that distorted thinking plays in creating fears, excessive worry, and ruminations.    Patricia Boyer participated in the creation of the treatment plan)    [name], LCSW

## 2022-01-10 ENCOUNTER — Ambulatory Visit: Payer: BC Managed Care – PPO | Admitting: Nurse Practitioner

## 2022-01-19 ENCOUNTER — Ambulatory Visit: Payer: BC Managed Care – PPO | Admitting: Psychology

## 2022-01-23 ENCOUNTER — Encounter: Payer: Self-pay | Admitting: Nurse Practitioner

## 2022-01-23 ENCOUNTER — Ambulatory Visit: Payer: BC Managed Care – PPO | Admitting: Nurse Practitioner

## 2022-01-23 VITALS — BP 150/110 | HR 80 | Temp 98.2°F | Ht 68.0 in | Wt 284.2 lb

## 2022-01-23 DIAGNOSIS — R030 Elevated blood-pressure reading, without diagnosis of hypertension: Secondary | ICD-10-CM

## 2022-01-23 DIAGNOSIS — F419 Anxiety disorder, unspecified: Secondary | ICD-10-CM | POA: Diagnosis not present

## 2022-01-23 DIAGNOSIS — Z566 Other physical and mental strain related to work: Secondary | ICD-10-CM

## 2022-01-23 DIAGNOSIS — Z6841 Body Mass Index (BMI) 40.0 and over, adult: Secondary | ICD-10-CM

## 2022-01-23 DIAGNOSIS — E559 Vitamin D deficiency, unspecified: Secondary | ICD-10-CM

## 2022-01-23 NOTE — Patient Instructions (Signed)

## 2022-01-23 NOTE — Progress Notes (Signed)
I,Patricia Boyer,acting as a Neurosurgeon for Patricia Felts, FNP.,have documented all relevant documentation on the behalf of Patricia Felts, FNP,as directed by  Patricia Felts, FNP while in the presence of Patricia Felts, FNP.   Subjective:     Patient ID: Patricia Boyer , female    DOB: 09-Jan-1981 , 41 y.o.   MRN: [id number]   Chief Complaint  Patient presents with   Weight Check    HPI  Patient presents today for a weight check. Her blood pressure is elevated today. She reports she thinks her job is stressful. She has recently had a paycut of 12,000 due to possibly having paying her too much money. She is now having trouble finding a job. She has recently purchased a home and is down about $1400 a month. She is having to use resources and get reimbursed. She is overwhelmed with work. She may eat and may not eat and graze. She has been drinking more sodas. She has taken this week off work. She has been to Dermatology about her hair breakage. Thoughts to be related to stress, was given an antibiotic hair wash. She is looking to not go to work for a while. She is working with a Scientist, research (medical).   Previously she was doing better with her weight, was able to eat healthier foods. She will eat more junk food lately.   Patient currently takes Qsymia.   She states having more of sweet & salty cravings. Eating more.  Before she states she would only eat half of any meal she would eat.  She does not know if this is how the phentermine is supposed to work.   She reports her therapist recommends her to see a psychiatrist.      Past Medical History:  Diagnosis Date   Anemia    Arthritis    Cataract    GERD (gastroesophageal reflux disease)      Family History  Problem Relation Age of Onset   Hypertension Mother    Sleep apnea Mother      Current Outpatient Medications:    busPIRone (BUSPAR) 5 MG tablet, Take 1 tablet (5 mg total) by mouth daily., Disp: 90 tablet, Rfl: 1   clobetasol  (TEMOVATE) 0.05 % external solution, Apply topically 2 (two) times daily., Disp: , Rfl:    vortioxetine HBr (TRINTELLIX) 10 MG TABS tablet, Take 1 tablet (10 mg total) by mouth daily., Disp: 90 tablet, Rfl: 1   Vitamin D, Ergocalciferol, (DRISDOL) 1.25 MG (50000 UNIT) CAPS capsule, Take 1 capsule (50,000 Units total) by mouth every 7 (seven) days. (Patient not taking: Reported on 01/23/2022), Disp: 12 capsule, Rfl: 1   No Known Allergies   Review of Systems  Constitutional: Negative.   Respiratory: Negative.    Cardiovascular: Negative.   Neurological: Negative.   Psychiatric/Behavioral: Negative.       Today's Vitals   01/23/22 1542  BP: (!) 150/110  Pulse: 80  Temp: 98.2 F (36.8 C)  SpO2: 96%  Weight: 284 lb 3.2 oz (128.9 kg)  Height: 5\' 8"  (1.727 m)   Body mass index is 43.21 kg/m.  Wt Readings from Last 3 Encounters:  01/23/22 284 lb 3.2 oz (128.9 kg)  10/26/21 261 lb (118.4 kg)  08/21/21 271 lb 3.2 oz (123 kg)    Objective:  Physical Exam Vitals reviewed.  Constitutional:      General: She is not in acute distress.    Appearance: Normal appearance.  Cardiovascular:     Rate  and Rhythm: Normal rate and regular rhythm.     Pulses: Normal pulses.     Heart sounds: Normal heart sounds. No murmur heard. Pulmonary:     Effort: Pulmonary effort is normal. No respiratory distress.     Breath sounds: Normal breath sounds. No wheezing.  Skin:    General: Skin is warm and dry.     Capillary Refill: Capillary refill takes less than 2 seconds.  Neurological:     General: No focal deficit present.     Mental Status: She is alert and oriented to person, place, and time.     Cranial Nerves: No cranial nerve deficit.     Motor: No weakness.  Psychiatric:        Mood and Affect: Mood normal. Mood is not anxious. Affect is tearful.        Behavior: Behavior normal.        Thought Content: Thought content normal.        Judgment: Judgment normal.         Assessment And  Plan:     1. Elevated blood pressure reading in office without diagnosis of hypertension Comments: Blood pressure is elevated, she will need to come for a NV for repeat  2. Vitamin D deficiency Will check vitamin D level and supplement as needed.    Also encouraged to spend 15 minutes in the sun daily.   3. Anxiety Comments: This is likely causing her blood pressure to increase. - Ambulatory referral to Psychiatry  4. Stress at work Comments: Will take her out of work until 02/19/2021. She is to f/u with therapist and psychiatry. - Ambulatory referral to Psychiatry  5. Class 3 severe obesity due to excess calories without serious comorbidity with body mass index (BMI) of 40.0 to 44.9 in adult [facility])  She is encouraged to strive for BMI less than 30 to decrease cardiac risk. Advised to aim for at least 150 minutes of exercise per week.    Patient was given opportunity to ask questions. Patient verbalized understanding of the plan and was able to repeat key elements of the plan. All questions were answered to their satisfaction.  Patricia Felts, FNP   I, Patricia Felts, FNP, have reviewed all documentation for this visit. The documentation on 01/23/22 for the exam, diagnosis, procedures, and orders are all accurate and complete.   IF YOU HAVE BEEN REFERRED TO A SPECIALIST, IT MAY TAKE 1-2 WEEKS TO SCHEDULE/PROCESS THE REFERRAL. IF YOU HAVE NOT HEARD FROM US/SPECIALIST IN TWO WEEKS, PLEASE GIVE [place] A CALL AT [phone] X [phone].   THE PATIENT IS ENCOURAGED TO PRACTICE SOCIAL DISTANCING DUE TO THE COVID-19 PANDEMIC.

## 2022-01-25 ENCOUNTER — Encounter: Payer: Self-pay | Admitting: Nurse Practitioner

## 2022-01-31 ENCOUNTER — Telehealth: Payer: Self-pay

## 2022-01-31 NOTE — Telephone Encounter (Signed)
Left patient voicemail. FMLA paperwork ready for pick up, left up front in [place] box.

## 2022-02-13 ENCOUNTER — Ambulatory Visit: Payer: BC Managed Care – PPO

## 2022-02-13 ENCOUNTER — Ambulatory Visit (INDEPENDENT_AMBULATORY_CARE_PROVIDER_SITE_OTHER): Payer: BC Managed Care – PPO | Admitting: Psychology

## 2022-02-13 VITALS — BP 140/98 | HR 85 | Temp 98.3°F

## 2022-02-13 DIAGNOSIS — F3289 Other specified depressive episodes: Secondary | ICD-10-CM | POA: Diagnosis not present

## 2022-02-13 DIAGNOSIS — R030 Elevated blood-pressure reading, without diagnosis of hypertension: Secondary | ICD-10-CM

## 2022-02-13 MED ORDER — AMLODIPINE BESYLATE 5 MG PO TABS: 5.0000 mg | ORAL_TABLET | Freq: Every day | ORAL | 1 refills | Status: DC

## 2022-02-13 NOTE — Progress Notes (Signed)
[facility] Counselor/Patricia Progress Note  Patient ID: Patricia Boyer, MRN: [id number]   Date: 02/13/22  Time Spent: 8:01  am - 9:01 am : 60 Minutes  Treatment Type: Individual Therapy.  Reported Symptoms: Depression and Anxiety.   Mental Status Exam: Appearance:  Casual     Behavior: Appropriate  Motor: Normal  Speech/Language:  Clear and Coherent  Affect: Congruent  Mood: anxious and depressed  Thought process: normal  Thought content:   WNL  Sensory/Perceptual disturbances:   WNL  Orientation: oriented to person, place, time/date, and situation  Attention: Good  Concentration: Good  Memory: WNL  Fund of knowledge:  Good  Insight:   Good  Judgment:  Good  Impulse Control: Good   Risk Assessment: Danger to Self:  No Self-injurious Behavior: No Danger to Others: No Duty to Warn:no Physical Aggression / Violence:No  Access to Firearms a concern: No  Gang Involvement:No   Subjective:   Patricia Boyer participated from home, via video and consented to treatment. Patricia participated from office. We met online due to [place] pandemic.Patricia Boyer noted recently being written out of work and receiving. She noted feeling targeted by her employer and noted the anxiety she experiences with delayed task assignment with tight time-lines, poor communication, and a continued lack of resources. We worked on identifying a need for boundaries in regards to communication via email, rushed time-lines, and boundaries with personal resources. We explored this during the session and ways to communicate this assertively and positively. Patricia Boyer to identify boundaries going forward and ways to maintain boundaries. We reviewed boundary setting, conflict resolution, and assertive communication. Patricia encouraged Patricia Boyer to identify ways to improve her mood via self-care. Patricia validated and normalized that she was going to experience and provided supportive  therapy.  Follow-up was scheduled for continued treatment.  Interventions: CBT & interpersonal  Diagnosis:  Other depression  Treatment Plan:  Client Abilities/Strengths Patricia Boyer is intelligent, highly educated, and motivated for change.   Support System: Family and friends.   Client Treatment Preferences Outpatient Therapy  Client Statement of Needs Patricia Boyer would like to manage her symptoms, process past events, improve her mood, address current stressors   Treatment Level Weekly  Symptoms  Depression:  loss of interest, feeling depressed and hopeless, hypersomnia & middle insomnia, feeling bad about self, over eating, feeling bad about self, trouble concentrating, psychomotor agitation.  (Status: maintained) Anxiety: Feeling anxious, difficulty managing worry, worrying about different things, trouble relaxing, restlessness, irritability, feeling afraid something awful might happen  (Status: maintained)  Goals:   Patricia Boyer experiences symptoms of depression and anxiety.    Target Date: 12/13/22 Frequency: Weekly  Progress: 0 Modality: individual    Patricia will provide referrals for additional resources as appropriate.  Patricia will provide psycho-education regarding Patricia Boyer's diagnosis and corresponding treatment approaches and interventions. Licensed Clinical Social Worker, [place], LCSW will support the patient's ability to achieve the goals identified. will employ CBT, BA, Problem-solving, Solution Focused, Mindfulness,  coping skills, & other evidenced-based practices will be used to promote progress towards healthy functioning to help manage decrease symptoms associated with her diagnosis.   Reduce overall level, frequency, and intensity of the feelings of depression, anxiety and panic evidenced by decreased overall symptoms from 6 to 7 days/week to 0 to 1 days/week per client report for at least 3 consecutive months. Verbally express understanding of the  relationship between feelings of depression, anxiety and their impact on thinking patterns and behaviors. Verbalize an understanding of the  role that distorted thinking plays in creating fears, excessive worry, and ruminations.    Patricia Boyer participated in the creation of the treatment plan)    Patricia Irish, LCSW

## 2022-02-13 NOTE — Patient Instructions (Signed)
Hypertension, Adult High blood pressure (hypertension) is when the force of blood pumping through the arteries is too strong. The arteries are the blood vessels that carry blood from the heart throughout the body. Hypertension forces the heart to work harder to pump blood and may cause arteries to become narrow or stiff. Untreated or uncontrolled hypertension can lead to a heart attack, heart failure, a stroke, kidney disease, and other problems. A blood pressure reading consists of a higher number over a lower number. Ideally, your blood pressure should be below 120/80. The first ("top") number is called the systolic pressure. It is a measure of the pressure in your arteries as your heart beats. The second ("bottom") number is called the diastolic pressure. It is a measure of the pressure in your arteries as the heart relaxes. What are the causes? The exact cause of this condition is not known. There are some conditions that result in high blood pressure. What increases the risk? Certain factors may make you more likely to develop high blood pressure. Some of these risk factors are under your control, including: Smoking. Not getting enough exercise or physical activity. Being overweight. Having too much fat, sugar, calories, or salt (sodium) in your diet. Drinking too much alcohol. Other risk factors include: Having a personal history of heart disease, diabetes, high cholesterol, or kidney disease. Stress. Having a family history of high blood pressure and high cholesterol. Having obstructive sleep apnea. Age. The risk increases with age. What are the signs or symptoms? High blood pressure may not cause symptoms. Very high blood pressure (hypertensive crisis) may cause: Headache. Fast or irregular heartbeats (palpitations). Shortness of breath. Nosebleed. Nausea and vomiting. Vision changes. Severe chest pain, dizziness, and seizures. How is this diagnosed? This condition is diagnosed by  measuring your blood pressure while you are seated, with your arm resting on a flat surface, your legs uncrossed, and your feet flat on the floor. The cuff of the blood pressure monitor will be placed directly against the skin of your upper arm at the level of your heart. Blood pressure should be measured at least twice using the same arm. Certain conditions can cause a difference in blood pressure between your right and left arms. If you have a high blood pressure reading during one visit or you have normal blood pressure with other risk factors, you may be asked to: Return on a different day to have your blood pressure checked again. Monitor your blood pressure at home for 1 week or longer. If you are diagnosed with hypertension, you may have other blood or imaging tests to help your health care provider understand your overall risk for other conditions. How is this treated? This condition is treated by making healthy lifestyle changes, such as eating healthy foods, exercising more, and reducing your alcohol intake. You may be referred for counseling on a healthy diet and physical activity. Your health care provider may prescribe medicine if lifestyle changes are not enough to get your blood pressure under control and if: Your systolic blood pressure is above 130. Your diastolic blood pressure is above 80. Your personal target blood pressure may vary depending on your medical conditions, your age, and other factors. Follow these instructions at home: Eating and drinking  Eat a diet that is high in fiber and potassium, and low in sodium, added sugar, and fat. An example of this eating plan is called the DASH diet. DASH stands for Dietary Approaches to Stop Hypertension. To eat this way: Eat   plenty of fresh fruits and vegetables. Try to fill one half of your plate at each meal with fruits and vegetables. Eat whole grains, such as whole-wheat pasta, brown rice, or whole-grain bread. Fill about one  fourth of your plate with whole grains. Eat or drink low-fat dairy products, such as skim milk or low-fat yogurt. Avoid fatty cuts of meat, processed or cured meats, and poultry with skin. Fill about one fourth of your plate with lean proteins, such as fish, chicken without skin, beans, eggs, or tofu. Avoid pre-made and processed foods. These tend to be higher in sodium, added sugar, and fat. Reduce your daily sodium intake. Many people with hypertension should eat less than 1,500 mg of sodium a day. Do not drink alcohol if: Your health care provider tells you not to drink. You are pregnant, may be pregnant, or are planning to become pregnant. If you drink alcohol: Limit how much you have to: 0-1 drink a day for women. 0-2 drinks a day for men. Know how much alcohol is in your drink. In the [place] one drink equals one 12 oz bottle of beer (355 mL), one 5 oz glass of wine (148 mL), or one 1 oz glass of hard liquor (44 mL). Lifestyle  Work with your health care provider to maintain a healthy body weight or to lose weight. Ask what an ideal weight is for you. Get at least 30 minutes of exercise that causes your heart to beat faster (aerobic exercise) most days of the week. Activities may include walking, swimming, or biking. Include exercise to strengthen your muscles (resistance exercise), such as Pilates or lifting weights, as part of your weekly exercise routine. Try to do these types of exercises for 30 minutes at least 3 days a week. Do not use any products that contain nicotine or tobacco. These products include cigarettes, chewing tobacco, and vaping devices, such as e-cigarettes. If you need help quitting, ask your health care provider. Monitor your blood pressure at home as told by your health care provider. Keep all follow-up visits. This is important. Medicines Take over-the-counter and prescription medicines only as told by your health care provider. Follow directions carefully. Blood  pressure medicines must be taken as prescribed. Do not skip doses of blood pressure medicine. Doing this puts you at risk for problems and can make the medicine less effective. Ask your health care provider about side effects or reactions to medicines that you should watch for. Contact a health care provider if you: Think you are having a reaction to a medicine you are taking. Have headaches that keep coming back (recurring). Feel dizzy. Have swelling in your ankles. Have trouble with your vision. Get help right away if you: Develop a severe headache or confusion. Have unusual weakness or numbness. Feel faint. Have severe pain in your chest or abdomen. Vomit repeatedly. Have trouble breathing. These symptoms may be an emergency. Get help right away. Call 911. Do not wait to see if the symptoms will go away. Do not drive yourself to the hospital. Summary Hypertension is when the force of blood pumping through your arteries is too strong. If this condition is not controlled, it may put you at risk for serious complications. Your personal target blood pressure may vary depending on your medical conditions, your age, and other factors. For most people, a normal blood pressure is less than 120/80. Hypertension is treated with lifestyle changes, medicines, or a combination of both. Lifestyle changes include losing weight, eating a healthy,   low-sodium diet, exercising more, and limiting alcohol. This information is not intended to replace advice given to you by your health care provider. Make sure you discuss any questions you have with your health care provider. Document Revised: 12/06/2020 Document Reviewed: 12/06/2020 Elsevier Patient Education  2023 Elsevier Inc.  

## 2022-02-13 NOTE — Progress Notes (Signed)
Patient presents today for BPC. She currently is not taking anything for her blood pressure.   BP Readings from Last 3 Encounters:  02/13/22 (!) 142/100  01/23/22 (!) 150/110  10/26/21 128/70   Provider would like to start amlodipine 2.5mg  and she will see the provider in two weeks.

## 2022-02-23 ENCOUNTER — Ambulatory Visit: Payer: BC Managed Care – PPO | Admitting: Psychology

## 2022-02-23 DIAGNOSIS — F418 Other specified anxiety disorders: Secondary | ICD-10-CM

## 2022-02-23 DIAGNOSIS — F3289 Other specified depressive episodes: Secondary | ICD-10-CM | POA: Diagnosis not present

## 2022-02-23 NOTE — Progress Notes (Signed)
[facility] Counselor/Therapist Progress Note  Patient ID: Patricia Boyer, MRN: [id number]   Date: 02/23/22  Time Spent: 3:32  pm - 4:29 pm : 57 Minutes  Treatment Type: Individual Therapy.  Reported Symptoms: Depression and Anxiety.   Mental Status Exam: Appearance:  Casual     Behavior: Appropriate  Motor: Normal  Speech/Language:  Clear and Coherent  Affect: Congruent  Mood: anxious and depressed  Thought process: normal  Thought content:   WNL  Sensory/Perceptual disturbances:   WNL  Orientation: oriented to person, place, time/date, and situation  Attention: Good  Concentration: Good  Memory: WNL  Fund of knowledge:  Good  Insight:   Good  Judgment:  Good  Impulse Control: Good   Risk Assessment: Danger to Self:  No Self-injurious Behavior: No Danger to Others: No Duty to Warn:no Physical Aggression / Violence:No  Access to Firearms a concern: No  Gang Involvement:No   Subjective:   Patricia Boyer participated from home, via video and consented to treatment. Therapist participated from office. We met online due to [place] pandemic. She noted meeting with a psychiatric provider with [facility] to complete paperwork and noted a follow-up with her psychiatric provider, [facility]. She noted having two pending job interviews. She noted frustration regarding interactions with her job, which we processed during the session. She noted working on improving her blood pressure due to recent high readings. We reviewed the importance of healthful eating, relaxation, reduction of caffeine, and engaging in relaxation. We worked on intentionality in regards to her output, self-care  (exercise, limiting caffeine, and engagement in enjoyable relaxing activities), and pursing activities that provide a feeling of accomplishment. She noted difficulty with her sleep and noted difficult going to sleep at night. She noted contributing factors to poor sleep including  caffeine, lack of exercise, and an inconsistent sleep schedule. She noted on working on challenging negative self-talk during the session, which therapist praised. She noted a need to manage her stressors in a more adaptive manner. We worked on distress tolerance and radical acceptance. Patricia Boyer was provided with handouts, via email, for reference and review. Therapist provided psycho-education and modeling.  Therapist validated and normalized that she was going to experience and provided supportive therapy.  Follow-up was scheduled for continued treatment.  Interventions: DBT & interpersonal  Diagnosis:  Other depression  Other specified anxiety disorders  Treatment Plan:  Client Abilities/Strengths Patricia Boyer is intelligent, highly educated, and motivated for change.   Support System: Family and friends.   Client Treatment Preferences Outpatient Therapy  Client Statement of Needs Patricia Boyer would like to manage her symptoms, process past events, improve her mood, address current stressors   Treatment Level Weekly  Symptoms  Depression:  loss of interest, feeling depressed and hopeless, hypersomnia & middle insomnia, feeling bad about self, over eating, feeling bad about self, trouble concentrating, psychomotor agitation.  (Status: maintained) Anxiety: Feeling anxious, difficulty managing worry, worrying about different things, trouble relaxing, restlessness, irritability, feeling afraid something awful might happen  (Status: maintained)  Goals:   Patricia Boyer experiences symptoms of depression and anxiety.    Target Date: 12/13/22 Frequency: Weekly  Progress: 0 Modality: individual    Therapist will provide referrals for additional resources as appropriate.  Therapist will provide psycho-education regarding Shona's diagnosis and corresponding treatment approaches and interventions. Licensed Clinical Social Worker, [place], LCSW will support the patient's ability to achieve the  goals identified. will employ CBT, BA, Problem-solving, Solution Focused, Mindfulness,  coping skills, & other  evidenced-based practices will be used to promote progress towards healthy functioning to help manage decrease symptoms associated with her diagnosis.   Reduce overall level, frequency, and intensity of the feelings of depression, anxiety and panic evidenced by decreased overall symptoms from 6 to 7 days/week to 0 to 1 days/week per client report for at least 3 consecutive months. Verbally express understanding of the relationship between feelings of depression, anxiety and their impact on thinking patterns and behaviors. Verbalize an understanding of the role that distorted thinking plays in creating fears, excessive worry, and ruminations.   Patricia Boyer participated in the creation of the treatment plan)   [name], LCSW

## 2022-03-01 ENCOUNTER — Other Ambulatory Visit: Payer: Self-pay

## 2022-03-01 ENCOUNTER — Encounter: Payer: Self-pay | Admitting: Nurse Practitioner

## 2022-03-01 ENCOUNTER — Ambulatory Visit: Payer: BC Managed Care – PPO | Admitting: Nurse Practitioner

## 2022-03-01 VITALS — BP 150/100 | HR 85 | Temp 98.2°F | Ht 68.0 in | Wt 286.0 lb

## 2022-03-01 DIAGNOSIS — I158 Other secondary hypertension: Secondary | ICD-10-CM

## 2022-03-01 DIAGNOSIS — E559 Vitamin D deficiency, unspecified: Secondary | ICD-10-CM | POA: Diagnosis not present

## 2022-03-01 DIAGNOSIS — F331 Major depressive disorder, recurrent, moderate: Secondary | ICD-10-CM | POA: Diagnosis not present

## 2022-03-01 DIAGNOSIS — E7800 Pure hypercholesterolemia, unspecified: Secondary | ICD-10-CM

## 2022-03-01 DIAGNOSIS — Z566 Other physical and mental strain related to work: Secondary | ICD-10-CM

## 2022-03-01 DIAGNOSIS — F419 Anxiety disorder, unspecified: Secondary | ICD-10-CM | POA: Diagnosis not present

## 2022-03-01 MED ORDER — AMLODIPINE BESYLATE 10 MG PO TABS: 10.0000 mg | ORAL_TABLET | Freq: Every day | ORAL | 1 refills | Status: DC

## 2022-03-01 MED ORDER — BUSPIRONE HCL 5 MG PO TABS: 5.0000 mg | ORAL_TABLET | Freq: Every day | ORAL | 1 refills | Status: DC

## 2022-03-01 MED ORDER — VITAMIN D (ERGOCALCIFEROL) 1.25 MG (50000 UNIT) PO CAPS: 50000.0000 [IU] | ORAL_CAPSULE | ORAL | 3 refills | Status: DC

## 2022-03-01 MED ORDER — VORTIOXETINE HBR 10 MG PO TABS: 10.0000 mg | ORAL_TABLET | Freq: Every day | ORAL | 1 refills | Status: DC

## 2022-03-01 NOTE — Progress Notes (Signed)
I,Tianna Badgett,acting as a Education administrator for [employer], FNP.,have documented all relevant documentation on the behalf of Minette Brine, FNP,as directed by  Minette Brine, FNP while in the presence of Minette Brine, [place].  Subjective:     Patient ID: Patricia Boyer , female    DOB: [birth year] , 42 y.o.   MRN: [id number]   Chief Complaint  Patient presents with   Hypertension    HPI  Here for BP and anxiety/depression f/u. She is scheduled to see a psychiatrist tomorrow. She continues to see a therapist Debbe Bales) once a week at [facility].   She reports she went back to work on January 8th and her supervisor said she did not report as she was working remote. At that time she decided to not return to work and used her paid time off and would like long term therapy     Past Medical History:  Diagnosis Date   Anemia    Arthritis    Cataract    GERD (gastroesophageal reflux disease)      Family History  Problem Relation Age of Onset   Hypertension Mother    Sleep apnea Mother      Current Outpatient Medications:    amLODipine (NORVASC) 10 MG tablet, Take 1 tablet (10 mg total) by mouth daily., Disp: 90 tablet, Rfl: 1   busPIRone (BUSPAR) 5 MG tablet, Take 1 tablet (5 mg total) by mouth daily., Disp: 90 tablet, Rfl: 1   clobetasol (TEMOVATE) 0.05 % external solution, Apply topically 2 (two) times daily., Disp: , Rfl:    Vitamin D, Ergocalciferol, (DRISDOL) 1.25 MG (50000 UNIT) CAPS capsule, Take 1 capsule (50,000 Units total) by mouth every 7 (seven) days., Disp: 12 capsule, Rfl: 3   vortioxetine HBr (TRINTELLIX) 10 MG TABS tablet, Take 1 tablet (10 mg total) by mouth daily., Disp: 90 tablet, Rfl: 1   No Known Allergies   Review of Systems  Constitutional: Negative.   Respiratory: Negative.    Cardiovascular: Negative.   Neurological: Negative.   Psychiatric/Behavioral: Negative.       Today's Vitals   03/01/22 0908 03/01/22 0959  BP: 132/70 (!) 150/100   Pulse: 85   Temp: 98.2 F (36.8 C)   TempSrc: Oral   Weight: 286 lb (129.7 kg)   Height: 5\' 8"  (1.727 m)    Body mass index is 43.49 kg/m.   Objective:  Physical Exam Vitals reviewed.  Constitutional:      General: She is not in acute distress.    Appearance: Normal appearance. She is obese.  Cardiovascular:     Rate and Rhythm: Normal rate and regular rhythm.     Pulses: Normal pulses.     Heart sounds: Normal heart sounds. No murmur heard. Pulmonary:     Effort: Pulmonary effort is normal. No respiratory distress.     Breath sounds: Normal breath sounds. No wheezing.  Skin:    General: Skin is warm and dry.     Capillary Refill: Capillary refill takes less than 2 seconds.  Neurological:     General: No focal deficit present.     Mental Status: She is alert and oriented to person, place, and time.     Cranial Nerves: No cranial nerve deficit.     Motor: No weakness.  Psychiatric:        Mood and Affect: Mood normal. Mood is not anxious. Affect is tearful.        Behavior: Behavior normal.  Thought Content: Thought content normal.        Judgment: Judgment normal.         Assessment And Plan:     1. Other secondary hypertension Comments: Likely related to stress at work, repeat BP more elevated. Will increase amlodipine and return in 2 weeks for [place] BP check. Encouraged to get arm cuff - BMP8+eGFR - amLODipine (NORVASC) 10 MG tablet; Take 1 tablet (10 mg total) by mouth daily.  Dispense: 90 tablet; Refill: 1  2. Stress at work Comments: She remains out of work after going back one day on 02/19/2022. Will call therapist to provide insight - busPIRone (BUSPAR) 5 MG tablet; Take 1 tablet (5 mg total) by mouth daily.  Dispense: 90 tablet; Refill: 1 - vortioxetine HBr (TRINTELLIX) 10 MG TABS tablet; Take 1 tablet (10 mg total) by mouth daily.  Dispense: 90 tablet; Refill: 1  3. Anxiety Comments: Tolerating medications well, she is currently out of work due to the  anxiety from work and her boyfriend is in the hospital. Call made to therapist awaiting return call to get insight on days off - busPIRone (BUSPAR) 5 MG tablet; Take 1 tablet (5 mg total) by mouth daily.  Dispense: 90 tablet; Refill: 1  4. Moderate episode of recurrent major depressive disorder ([place]) Comments: Continue current medications. - vortioxetine HBr (TRINTELLIX) 10 MG TABS tablet; Take 1 tablet (10 mg total) by mouth daily.  Dispense: 90 tablet; Refill: 1  5. Vitamin D deficiency Will check vitamin D level and supplement as needed.    Also encouraged to spend 15 minutes in the sun daily.  - VITAMIN D 25 Hydroxy (Vit-D Deficiency, Fractures)  6. Elevated cholesterol Comments: Cholesterol levels are stable.  Encouraged to eat a low-fat diet. - Lipid panel - BMP8+eGFR     Patient was given opportunity to ask questions. Patient verbalized understanding of the plan and was able to repeat key elements of the plan. All questions were answered to their satisfaction.  Minette Brine, FNP   I, Minette Brine, FNP, have reviewed all documentation for this visit. The documentation on 02/28/22 for the exam, diagnosis, procedures, and orders are all accurate and complete.   IF YOU HAVE BEEN REFERRED TO A SPECIALIST, IT MAY TAKE 1-2 WEEKS TO SCHEDULE/PROCESS THE REFERRAL. IF YOU HAVE NOT HEARD FROM US/SPECIALIST IN TWO WEEKS, PLEASE GIVE [place] A CALL AT [phone].   THE PATIENT IS ENCOURAGED TO PRACTICE SOCIAL DISTANCING DUE TO THE COVID-19 PANDEMIC.

## 2022-03-01 NOTE — Patient Instructions (Signed)
Hypertension, Adult High blood pressure (hypertension) is when the force of blood pumping through the arteries is too strong. The arteries are the blood vessels that carry blood from the heart throughout the body. Hypertension forces the heart to work harder to pump blood and may cause arteries to become narrow or stiff. Untreated or uncontrolled hypertension can lead to a heart attack, heart failure, a stroke, kidney disease, and other problems. A blood pressure reading consists of a higher number over a lower number. Ideally, your blood pressure should be below 120/80. The first ("top") number is called the systolic pressure. It is a measure of the pressure in your arteries as your heart beats. The second ("bottom") number is called the diastolic pressure. It is a measure of the pressure in your arteries as the heart relaxes. What are the causes? The exact cause of this condition is not known. There are some conditions that result in high blood pressure. What increases the risk? Certain factors may make you more likely to develop high blood pressure. Some of these risk factors are under your control, including: Smoking. Not getting enough exercise or physical activity. Being overweight. Having too much fat, sugar, calories, or salt (sodium) in your diet. Drinking too much alcohol. Other risk factors include: Having a personal history of heart disease, diabetes, high cholesterol, or kidney disease. Stress. Having a family history of high blood pressure and high cholesterol. Having obstructive sleep apnea. Age. The risk increases with age. What are the signs or symptoms? High blood pressure may not cause symptoms. Very high blood pressure (hypertensive crisis) may cause: Headache. Fast or irregular heartbeats (palpitations). Shortness of breath. Nosebleed. Nausea and vomiting. Vision changes. Severe chest pain, dizziness, and seizures. How is this diagnosed? This condition is diagnosed by  measuring your blood pressure while you are seated, with your arm resting on a flat surface, your legs uncrossed, and your feet flat on the floor. The cuff of the blood pressure monitor will be placed directly against the skin of your upper arm at the level of your heart. Blood pressure should be measured at least twice using the same arm. Certain conditions can cause a difference in blood pressure between your right and left arms. If you have a high blood pressure reading during one visit or you have normal blood pressure with other risk factors, you may be asked to: Return on a different day to have your blood pressure checked again. Monitor your blood pressure at home for 1 week or longer. If you are diagnosed with hypertension, you may have other blood or imaging tests to help your health care provider understand your overall risk for other conditions. How is this treated? This condition is treated by making healthy lifestyle changes, such as eating healthy foods, exercising more, and reducing your alcohol intake. You may be referred for counseling on a healthy diet and physical activity. Your health care provider may prescribe medicine if lifestyle changes are not enough to get your blood pressure under control and if: Your systolic blood pressure is above 130. Your diastolic blood pressure is above 80. Your personal target blood pressure may vary depending on your medical conditions, your age, and other factors. Follow these instructions at home: Eating and drinking  Eat a diet that is high in fiber and potassium, and low in sodium, added sugar, and fat. An example of this eating plan is called the DASH diet. DASH stands for Dietary Approaches to Stop Hypertension. To eat this way: Eat   plenty of fresh fruits and vegetables. Try to fill one half of your plate at each meal with fruits and vegetables. Eat whole grains, such as whole-wheat pasta, brown rice, or whole-grain bread. Fill about one  fourth of your plate with whole grains. Eat or drink low-fat dairy products, such as skim milk or low-fat yogurt. Avoid fatty cuts of meat, processed or cured meats, and poultry with skin. Fill about one fourth of your plate with lean proteins, such as fish, chicken without skin, beans, eggs, or tofu. Avoid pre-made and processed foods. These tend to be higher in sodium, added sugar, and fat. Reduce your daily sodium intake. Many people with hypertension should eat less than 1,500 mg of sodium a day. Do not drink alcohol if: Your health care provider tells you not to drink. You are pregnant, may be pregnant, or are planning to become pregnant. If you drink alcohol: Limit how much you have to: 0-1 drink a day for women. 0-2 drinks a day for men. Know how much alcohol is in your drink. In the [place] one drink equals one 12 oz bottle of beer (355 mL), one 5 oz glass of wine (148 mL), or one 1 oz glass of hard liquor (44 mL). Lifestyle  Work with your health care provider to maintain a healthy body weight or to lose weight. Ask what an ideal weight is for you. Get at least 30 minutes of exercise that causes your heart to beat faster (aerobic exercise) most days of the week. Activities may include walking, swimming, or biking. Include exercise to strengthen your muscles (resistance exercise), such as Pilates or lifting weights, as part of your weekly exercise routine. Try to do these types of exercises for 30 minutes at least 3 days a week. Do not use any products that contain nicotine or tobacco. These products include cigarettes, chewing tobacco, and vaping devices, such as e-cigarettes. If you need help quitting, ask your health care provider. Monitor your blood pressure at home as told by your health care provider. Keep all follow-up visits. This is important. Medicines Take over-the-counter and prescription medicines only as told by your health care provider. Follow directions carefully. Blood  pressure medicines must be taken as prescribed. Do not skip doses of blood pressure medicine. Doing this puts you at risk for problems and can make the medicine less effective. Ask your health care provider about side effects or reactions to medicines that you should watch for. Contact a health care provider if you: Think you are having a reaction to a medicine you are taking. Have headaches that keep coming back (recurring). Feel dizzy. Have swelling in your ankles. Have trouble with your vision. Get help right away if you: Develop a severe headache or confusion. Have unusual weakness or numbness. Feel faint. Have severe pain in your chest or abdomen. Vomit repeatedly. Have trouble breathing. These symptoms may be an emergency. Get help right away. Call 911. Do not wait to see if the symptoms will go away. Do not drive yourself to the hospital. Summary Hypertension is when the force of blood pumping through your arteries is too strong. If this condition is not controlled, it may put you at risk for serious complications. Your personal target blood pressure may vary depending on your medical conditions, your age, and other factors. For most people, a normal blood pressure is less than 120/80. Hypertension is treated with lifestyle changes, medicines, or a combination of both. Lifestyle changes include losing weight, eating a healthy,   low-sodium diet, exercising more, and limiting alcohol. This information is not intended to replace advice given to you by your health care provider. Make sure you discuss any questions you have with your health care provider. Document Revised: 12/06/2020 Document Reviewed: 12/06/2020 Elsevier Patient Education  2023 Elsevier Inc.  

## 2022-03-02 LAB — BMP8+EGFR
BUN/Creatinine Ratio: 8 — ABNORMAL LOW (ref 9–23)
BUN: 6 mg/dL (ref 6–24)
CO2: 22 mmol/L (ref 20–29)
Calcium: 9.2 mg/dL (ref 8.7–10.2)
Chloride: 103 mmol/L (ref 96–106)
Creatinine, Ser: 0.79 mg/dL (ref 0.57–1.00)
Glucose: 78 mg/dL (ref 70–99)
Potassium: 4.4 mmol/L (ref 3.5–5.2)
Sodium: 136 mmol/L (ref 134–144)
eGFR: 97 mL/min/{1.73_m2} (ref >59)

## 2022-03-02 LAB — LIPID PANEL
Chol/HDL Ratio: 2.3 ratio (ref 0.0–4.4)
Cholesterol, Total: 173 mg/dL (ref 100–199)
HDL: 75 mg/dL (ref >39)
LDL Chol Calc (NIH): 89 mg/dL (ref 0–99)
Triglycerides: 45 mg/dL (ref 0–149)
VLDL Cholesterol Cal: 9 mg/dL (ref 5–40)

## 2022-03-02 LAB — VITAMIN D 25 HYDROXY (VIT D DEFICIENCY, FRACTURES): Vit D, 25-Hydroxy: 12.9 ng/mL — ABNORMAL LOW (ref 30.0–100.0)

## 2022-03-09 ENCOUNTER — Ambulatory Visit (INDEPENDENT_AMBULATORY_CARE_PROVIDER_SITE_OTHER): Payer: BC Managed Care – PPO | Admitting: Psychology

## 2022-03-09 DIAGNOSIS — F3289 Other specified depressive episodes: Secondary | ICD-10-CM | POA: Diagnosis not present

## 2022-03-09 DIAGNOSIS — F418 Other specified anxiety disorders: Secondary | ICD-10-CM

## 2022-03-09 NOTE — Progress Notes (Signed)
[facility] Counselor/Therapist Progress Note  Patient ID: Patricia Boyer, MRN: [id number]   Date: 03/09/22  Time Spent: 11:00  am - 11:55  am : 55 Minutes  Treatment Type: Individual Therapy.  Reported Symptoms: Depression and Anxiety.   Mental Status Exam: Appearance:  Casual     Behavior: Appropriate  Motor: Normal  Speech/Language:  Clear and Coherent  Affect: Congruent  Mood: anxious and depressed  Thought process: normal  Thought content:   WNL  Sensory/Perceptual disturbances:   WNL  Orientation: oriented to person, place, time/date, and situation  Attention: Good  Concentration: Good  Memory: WNL  Fund of knowledge:  Good  Insight:   Good  Judgment:  Good  Impulse Control: Good   Risk Assessment: Danger to Self:  No Self-injurious Behavior: No Danger to Others: No Duty to Warn:no Physical Aggression / Violence:No  Access to Firearms a concern: No  Gang Involvement:No   Subjective:   Patricia Boyer participated from home, via video and consented to treatment. Therapist participated from home office. We met online due to Patricia Boyer pandemic. She noted a recent medication change on 03/04/22, by her prescriber, and is now taking Patricia Boyer 20 mg & Patricia Boyer 10 mg bid. She has a follow-up in mid-February. She noted being on leave from work and noted an improvement in focus, mood, and task completion. She noted an improvement in her diet, as well. She noted the stark realization of the negative effects of her current job on her mood and functioning. She noted her boyfriend's recent medical emergency and noted this being both stressful and anxiety producing. We worked on processing work related stressors and how to manage stressors and reduce rumination regarding negative interactions and experiences. She noted feeling unsupported and unacknowledged in her current position. We worked on feeling identification during the session and identified feelings of  powerlessness. She noted experiencing grief in relation to what she wanted to do with her degree and what she is relegated to. We began exploring this during the session. Therapist encouraged Patricia Boyer to identify her feelings, cope with stressors, and manage her rumination via the tools previously discussed. Therapist validated Patricia Boyer's feelings and experience. Therapist provided supportive therapy. We scheduled follow-up sessions for continued treatment.   Interventions: CBT & interpersonal  Diagnosis:  Other depression  Other specified anxiety disorders  Treatment Plan:  Client Abilities/Strengths Patricia Boyer is intelligent, highly educated, and motivated for change.   Support System: Family and friends.   Client Treatment Preferences Outpatient Therapy  Client Statement of Needs Patricia Boyer would like to manage her symptoms, process past events, improve her mood, address current stressors   Treatment Level Weekly  Symptoms  Depression:  loss of interest, feeling depressed and hopeless, hypersomnia & middle insomnia, feeling bad about self, over eating, feeling bad about self, trouble concentrating, psychomotor agitation.  (Status: maintained) Anxiety: Feeling anxious, difficulty managing worry, worrying about different things, trouble relaxing, restlessness, irritability, feeling afraid something awful might happen  (Status: maintained)  Goals:   [name] experiences symptoms of depression and anxiety.    Target Date: 12/13/22 Frequency: Weekly  Progress: 0 Modality: individual    Therapist will provide referrals for additional resources as appropriate.  Therapist will provide psycho-education regarding Orra's diagnosis and corresponding treatment approaches and interventions. Licensed Clinical Social Worker, [place], LCSW will support the patient's ability to achieve the goals identified. will employ CBT, BA, Problem-solving, Solution Focused, Mindfulness,  coping skills, &  other evidenced-based practices will be used to promote  progress towards healthy functioning to help manage decrease symptoms associated with her diagnosis.   Reduce overall level, frequency, and intensity of the feelings of depression, anxiety and panic evidenced by decreased overall symptoms from 6 to 7 days/week to 0 to 1 days/week per client report for at least 3 consecutive months. Verbally express understanding of the relationship between feelings of depression, anxiety and their impact on thinking patterns and behaviors. Verbalize an understanding of the role that distorted thinking plays in creating fears, excessive worry, and ruminations.   [name] participated in the creation of the treatment plan)   [name], LCSW

## 2022-03-13 ENCOUNTER — Ambulatory Visit: Payer: BC Managed Care – PPO

## 2022-03-13 VITALS — BP 128/78 | HR 75 | Temp 98.1°F

## 2022-03-13 DIAGNOSIS — R030 Elevated blood-pressure reading, without diagnosis of hypertension: Secondary | ICD-10-CM

## 2022-03-13 NOTE — Patient Instructions (Signed)
Hypertension, Adult High blood pressure (hypertension) is when the force of blood pumping through the arteries is too strong. The arteries are the blood vessels that carry blood from the heart throughout the body. Hypertension forces the heart to work harder to pump blood and may cause arteries to become narrow or stiff. Untreated or uncontrolled hypertension can lead to a heart attack, heart failure, a stroke, kidney disease, and other problems. A blood pressure reading consists of a higher number over a lower number. Ideally, your blood pressure should be below 120/80. The first ("top") number is called the systolic pressure. It is a measure of the pressure in your arteries as your heart beats. The second ("bottom") number is called the diastolic pressure. It is a measure of the pressure in your arteries as the heart relaxes. What are the causes? The exact cause of this condition is not known. There are some conditions that result in high blood pressure. What increases the risk? Certain factors may make you more likely to develop high blood pressure. Some of these risk factors are under your control, including: Smoking. Not getting enough exercise or physical activity. Being overweight. Having too much fat, sugar, calories, or salt (sodium) in your diet. Drinking too much alcohol. Other risk factors include: Having a personal history of heart disease, diabetes, high cholesterol, or kidney disease. Stress. Having a family history of high blood pressure and high cholesterol. Having obstructive sleep apnea. Age. The risk increases with age. What are the signs or symptoms? High blood pressure may not cause symptoms. Very high blood pressure (hypertensive crisis) may cause: Headache. Fast or irregular heartbeats (palpitations). Shortness of breath. Nosebleed. Nausea and vomiting. Vision changes. Severe chest pain, dizziness, and seizures. How is this diagnosed? This condition is diagnosed by  measuring your blood pressure while you are seated, with your arm resting on a flat surface, your legs uncrossed, and your feet flat on the floor. The cuff of the blood pressure monitor will be placed directly against the skin of your upper arm at the level of your heart. Blood pressure should be measured at least twice using the same arm. Certain conditions can cause a difference in blood pressure between your right and left arms. If you have a high blood pressure reading during one visit or you have normal blood pressure with other risk factors, you may be asked to: Return on a different day to have your blood pressure checked again. Monitor your blood pressure at home for 1 week or longer. If you are diagnosed with hypertension, you may have other blood or imaging tests to help your health care provider understand your overall risk for other conditions. How is this treated? This condition is treated by making healthy lifestyle changes, such as eating healthy foods, exercising more, and reducing your alcohol intake. You may be referred for counseling on a healthy diet and physical activity. Your health care provider may prescribe medicine if lifestyle changes are not enough to get your blood pressure under control and if: Your systolic blood pressure is above 130. Your diastolic blood pressure is above 80. Your personal target blood pressure may vary depending on your medical conditions, your age, and other factors. Follow these instructions at home: Eating and drinking  Eat a diet that is high in fiber and potassium, and low in sodium, added sugar, and fat. An example of this eating plan is called the DASH diet. DASH stands for Dietary Approaches to Stop Hypertension. To eat this way: Eat   plenty of fresh fruits and vegetables. Try to fill one half of your plate at each meal with fruits and vegetables. Eat whole grains, such as whole-wheat pasta, brown rice, or whole-grain bread. Fill about one  fourth of your plate with whole grains. Eat or drink low-fat dairy products, such as skim milk or low-fat yogurt. Avoid fatty cuts of meat, processed or cured meats, and poultry with skin. Fill about one fourth of your plate with lean proteins, such as fish, chicken without skin, beans, eggs, or tofu. Avoid pre-made and processed foods. These tend to be higher in sodium, added sugar, and fat. Reduce your daily sodium intake. Many people with hypertension should eat less than 1,500 mg of sodium a day. Do not drink alcohol if: Your health care provider tells you not to drink. You are pregnant, may be pregnant, or are planning to become pregnant. If you drink alcohol: Limit how much you have to: 0-1 drink a day for women. 0-2 drinks a day for men. Know how much alcohol is in your drink. In the [place] one drink equals one 12 oz bottle of beer (355 mL), one 5 oz glass of wine (148 mL), or one 1 oz glass of hard liquor (44 mL). Lifestyle  Work with your health care provider to maintain a healthy body weight or to lose weight. Ask what an ideal weight is for you. Get at least 30 minutes of exercise that causes your heart to beat faster (aerobic exercise) most days of the week. Activities may include walking, swimming, or biking. Include exercise to strengthen your muscles (resistance exercise), such as Pilates or lifting weights, as part of your weekly exercise routine. Try to do these types of exercises for 30 minutes at least 3 days a week. Do not use any products that contain nicotine or tobacco. These products include cigarettes, chewing tobacco, and vaping devices, such as e-cigarettes. If you need help quitting, ask your health care provider. Monitor your blood pressure at home as told by your health care provider. Keep all follow-up visits. This is important. Medicines Take over-the-counter and prescription medicines only as told by your health care provider. Follow directions carefully. Blood  pressure medicines must be taken as prescribed. Do not skip doses of blood pressure medicine. Doing this puts you at risk for problems and can make the medicine less effective. Ask your health care provider about side effects or reactions to medicines that you should watch for. Contact a health care provider if you: Think you are having a reaction to a medicine you are taking. Have headaches that keep coming back (recurring). Feel dizzy. Have swelling in your ankles. Have trouble with your vision. Get help right away if you: Develop a severe headache or confusion. Have unusual weakness or numbness. Feel faint. Have severe pain in your chest or abdomen. Vomit repeatedly. Have trouble breathing. These symptoms may be an emergency. Get help right away. Call 911. Do not wait to see if the symptoms will go away. Do not drive yourself to the hospital. Summary Hypertension is when the force of blood pumping through your arteries is too strong. If this condition is not controlled, it may put you at risk for serious complications. Your personal target blood pressure may vary depending on your medical conditions, your age, and other factors. For most people, a normal blood pressure is less than 120/80. Hypertension is treated with lifestyle changes, medicines, or a combination of both. Lifestyle changes include losing weight, eating a healthy,   low-sodium diet, exercising more, and limiting alcohol. This information is not intended to replace advice given to you by your health care provider. Make sure you discuss any questions you have with your health care provider. Document Revised: 12/06/2020 Document Reviewed: 12/06/2020 Elsevier Patient Education  2023 Elsevier Inc.  

## 2022-03-13 NOTE — Progress Notes (Deleted)
No show

## 2022-03-13 NOTE — Progress Notes (Signed)
Patient presents today for BPC. She is currently taking Amlodipine 10mg .  BP Readings from Last 3 Encounters:  03/13/22 128/78  03/01/22 (!) 150/100  02/13/22 (!) 140/98   Provider would like to continue with current medications. Patient will follow up as scheduled.

## 2022-03-22 ENCOUNTER — Ambulatory Visit: Payer: BC Managed Care – PPO | Admitting: Psychology

## 2022-03-22 DIAGNOSIS — F3289 Other specified depressive episodes: Secondary | ICD-10-CM | POA: Diagnosis not present

## 2022-03-22 DIAGNOSIS — F418 Other specified anxiety disorders: Secondary | ICD-10-CM

## 2022-03-22 NOTE — Progress Notes (Signed)
[facility] Counselor/Therapist Progress Note  Patient ID: Patricia Boyer, MRN: [id number]   Date: 03/22/22  Time Spent: 11:02  am - 11:56 am : 16 Minutes  Treatment Type: Individual Therapy.  Reported Symptoms: Depression and Anxiety.   Mental Status Exam: Appearance:  Casual     Behavior: Appropriate  Motor: Normal  Speech/Language:  Clear and Coherent  Affect: Congruent  Mood: anxious and depressed  Thought process: normal  Thought content:   WNL  Sensory/Perceptual disturbances:   WNL  Orientation: oriented to person, place, time/date, and situation  Attention: Good  Concentration: Good  Memory: WNL  Fund of knowledge:  Good  Insight:   Good  Judgment:  Good  Impulse Control: Good   Risk Assessment: Danger to Self:  No Self-injurious Behavior: No Danger to Others: No Duty to Warn:no Physical Aggression / Violence:No  Access to Firearms a concern: No  Gang Involvement:No   Subjective:   Patricia Boyer participated from home, via video and consented to treatment. Therapist participated from home office. We met online due to [place] pandemic. She noted returning to work and the onset of stress upon her return. We worked on processing her feelings and frustrations. We worked on identifying areas of control and lack of control and focusing in areas that there is control. She noted frustration regarding being denied for a position due to being "over qualified". She noted feeling "minimized" by a lack of ability to do her job effectively and meaningfully. We worked on continuing to set boundaries for self regarding rumination concerning frustrating interactions.  We worked distress tolerance, advocating for self assertively, and manage frustration proactively. She is taking her medication consistently and noted feeling some improvement in mood. Therapist validated Patricia Boyer's feelings and experience. Therapist provided supportive therapy. We scheduled follow-up  sessions for continued treatment.   Interventions: CBT & interpersonal  Diagnosis:  Other depression  Other specified anxiety disorders  Treatment Plan:  Client Abilities/Strengths Patricia Boyer is intelligent, highly educated, and motivated for change.   Support System: Family and friends.   Client Treatment Preferences Outpatient Therapy  Client Statement of Needs Patricia Boyer would like to manage her symptoms, process past events, improve her mood, address current stressors   Treatment Level Weekly  Symptoms  Depression:  loss of interest, feeling depressed and hopeless, hypersomnia & middle insomnia, feeling bad about self, over eating, feeling bad about self, trouble concentrating, psychomotor agitation.  (Status: maintained) Anxiety: Feeling anxious, difficulty managing worry, worrying about different things, trouble relaxing, restlessness, irritability, feeling afraid something awful might happen  (Status: maintained)  Goals:   Patricia Boyer experiences symptoms of depression and anxiety.    Target Date: 12/13/22 Frequency: Weekly  Progress: 0 Modality: individual    Therapist will provide referrals for additional resources as appropriate.  Therapist will provide psycho-education regarding Patricia Boyer's diagnosis and corresponding treatment approaches and interventions. Licensed Clinical Social Worker, [place], LCSW will support the patient's ability to achieve the goals identified. will employ CBT, BA, Problem-solving, Solution Focused, Mindfulness,  coping skills, & other evidenced-based practices will be used to promote progress towards healthy functioning to help manage decrease symptoms associated with her diagnosis.   Reduce overall level, frequency, and intensity of the feelings of depression, anxiety and panic evidenced by decreased overall symptoms from 6 to 7 days/week to 0 to 1 days/week per client report for at least 3 consecutive months. Verbally express understanding of  the relationship between feelings of depression, anxiety and their impact on thinking patterns  and behaviors. Verbalize an understanding of the role that distorted thinking plays in creating fears, excessive worry, and ruminations.   Patricia Boyer participated in the creation of the treatment plan)   [name], LCSW

## 2022-03-29 ENCOUNTER — Encounter: Payer: Self-pay | Admitting: Nurse Practitioner

## 2022-03-29 ENCOUNTER — Ambulatory Visit: Payer: BC Managed Care – PPO | Admitting: Nurse Practitioner

## 2022-03-29 VITALS — BP 148/112 | HR 85 | Temp 98.0°F | Ht 68.0 in | Wt 290.0 lb

## 2022-03-29 DIAGNOSIS — I10 Essential (primary) hypertension: Secondary | ICD-10-CM | POA: Insufficient documentation

## 2022-03-29 DIAGNOSIS — Z872 Personal history of diseases of the skin and subcutaneous tissue: Secondary | ICD-10-CM

## 2022-03-29 DIAGNOSIS — F331 Major depressive disorder, recurrent, moderate: Secondary | ICD-10-CM | POA: Insufficient documentation

## 2022-03-29 DIAGNOSIS — F419 Anxiety disorder, unspecified: Secondary | ICD-10-CM | POA: Diagnosis not present

## 2022-03-29 DIAGNOSIS — E7800 Pure hypercholesterolemia, unspecified: Secondary | ICD-10-CM | POA: Insufficient documentation

## 2022-03-29 MED ORDER — SPIRONOLACTONE 25 MG PO TABS: 25.0000 mg | ORAL_TABLET | Freq: Every day | ORAL | 2 refills | Status: DC

## 2022-03-29 MED ORDER — MAGNESIUM GLUCONATE 250 MG PO TABS: 1.0000 | ORAL_TABLET | Freq: Every evening | ORAL | 1 refills | Status: DC

## 2022-03-29 NOTE — Patient Instructions (Signed)
Insomnia Insomnia is a sleep disorder that makes it difficult to fall asleep or stay asleep. Insomnia can cause fatigue, low energy, difficulty concentrating, mood swings, and poor performance at work or school. There are three different ways to classify insomnia: Difficulty falling asleep. Difficulty staying asleep. Waking up too early in the morning. Any type of insomnia can be long-term (chronic) or short-term (acute). Both are common. Short-term insomnia usually lasts for 3 months or less. Chronic insomnia occurs at least three times a week for longer than 3 months. What are the causes? Insomnia may be caused by another condition, situation, or substance, such as: Having certain mental health conditions, such as anxiety and depression. Using caffeine, alcohol, tobacco, or drugs. Having gastrointestinal conditions, such as gastroesophageal reflux disease (GERD). Having certain medical conditions. These include: Asthma. Alzheimer's disease. Stroke. Chronic pain. An overactive thyroid gland (hyperthyroidism). Other sleep disorders, such as restless legs syndrome and sleep apnea. Menopause. Sometimes, the cause of insomnia may not be known. What increases the risk? Risk factors for insomnia include: Gender. Females are affected more often than males. Age. Insomnia is more common as people get older. Stress and certain medical and mental health conditions. Lack of exercise. Having an irregular work schedule. This may include working night shifts and traveling between different time zones. What are the signs or symptoms? If you have insomnia, the main symptom is having trouble falling asleep or having trouble staying asleep. This may lead to other symptoms, such as: Feeling tired or having low energy. Feeling nervous about going to sleep. Not feeling rested in the morning. Having trouble concentrating. Feeling irritable, anxious, or depressed. How is this diagnosed? This condition  may be diagnosed based on: Your symptoms and medical history. Your health care provider may ask about: Your sleep habits. Any medical conditions you have. Your mental health. A physical exam. How is this treated? Treatment for insomnia depends on the cause. Treatment may focus on treating an underlying condition that is causing the insomnia. Treatment may also include: Medicines to help you sleep. Counseling or therapy. Lifestyle adjustments to help you sleep better. Follow these instructions at home: Eating and drinking  Limit or avoid alcohol, caffeinated beverages, and products that contain nicotine and tobacco, especially close to bedtime. These can disrupt your sleep. Do not eat a large meal or eat spicy foods right before bedtime. This can lead to digestive discomfort that can make it hard for you to sleep. Sleep habits  Keep a sleep diary to help you and your health care provider figure out what could be causing your insomnia. Write down: When you sleep. When you wake up during the night. How well you sleep and how rested you feel the next day. Any side effects of medicines you are taking. What you eat and drink. Make your bedroom a dark, comfortable place where it is easy to fall asleep. Put up shades or blackout curtains to block light from outside. Use a white noise machine to block noise. Keep the temperature cool. Limit screen use before bedtime. This includes: Not watching TV. Not using your smartphone, tablet, or computer. Stick to a routine that includes going to bed and waking up at the same times every day and night. This can help you fall asleep faster. Consider making a quiet activity, such as reading, part of your nighttime routine. Try to avoid taking naps during the day so that you sleep better at night. Get out of bed if you are still awake after   15 minutes of trying to sleep. Keep the lights down, but try reading or doing a quiet activity. When you feel  sleepy, go back to bed. General instructions Take over-the-counter and prescription medicines only as told by your health care provider. Exercise regularly as told by your health care provider. However, avoid exercising in the hours right before bedtime. Use relaxation techniques to manage stress. Ask your health care provider to suggest some techniques that may work well for you. These may include: Breathing exercises. Routines to release muscle tension. Visualizing peaceful scenes. Make sure that you drive carefully. Do not drive if you feel very sleepy. Keep all follow-up visits. This is important. Contact a health care provider if: You are tired throughout the day. You have trouble in your daily routine due to sleepiness. You continue to have sleep problems, or your sleep problems get worse. Get help right away if: You have thoughts about hurting yourself or someone else. Get help right away if you feel like you may hurt yourself or others, or have thoughts about taking your own life. Go to your nearest emergency room or: Call 911. Call the [place] at [phone] or 988. This is open 24 hours a day. Text the Crisis Text Line at 9288785847. Summary Insomnia is a sleep disorder that makes it difficult to fall asleep or stay asleep. Insomnia can be long-term (chronic) or short-term (acute). Treatment for insomnia depends on the cause. Treatment may focus on treating an underlying condition that is causing the insomnia. Keep a sleep diary to help you and your health care provider figure out what could be causing your insomnia. This information is not intended to replace advice given to you by your health care provider. Make sure you discuss any questions you have with your health care provider. Document Revised: 01/09/2021 Document Reviewed: 01/09/2021 Elsevier Patient Education  [place] can take Calm to help with your sleep. Also Ashwaghanda  is a good supplement for anxiety.

## 2022-03-29 NOTE — Progress Notes (Signed)
I,Patricia Boyer,acting as a Education administrator for Patricia Brine, FNP.,have documented all relevant documentation on the behalf of Patricia Brine, FNP,as directed by  Patricia Brine, FNP while in the presence of Patricia Boyer, Druid Boyer.    Subjective:     Patient ID: Patricia Boyer , female    DOB: [birth year] , 42 y.o.   MRN: [id number]   Chief Complaint  Patient presents with   Weight Check    HPI  Patient presents today for weight check. Patient would also like to discuss her BP. Patient has no other complaints or concerns.  She was walking more prior to going back to work. She is now getting home late. She does not sleep through the night, she will wake up twice during the night. She feels like she has had some changes how she feels with the trintellix and buspar. She is now snoring.    Wt Readings from Last 3 Encounters: 03/29/22 : 290 lb (131.5 kg) 03/01/22 : 286 lb (129.7 kg) 01/23/22 : 284 lb 3.2 oz (128.9 kg)        Past Medical History:  Diagnosis Date   Anemia    Arthritis    Cataract    GERD (gastroesophageal reflux disease)      Family History  Problem Relation Age of Onset   Hypertension Mother    Sleep apnea Mother      Current Outpatient Medications:    amLODipine (NORVASC) 10 MG tablet, Take 1 tablet (10 mg total) by mouth daily., Disp: 90 tablet, Rfl: 1   busPIRone (BUSPAR) 5 MG tablet, Take 1 tablet (5 mg total) by mouth daily., Disp: 90 tablet, Rfl: 1   clobetasol (TEMOVATE) 0.05 % external solution, Apply topically 2 (two) times daily., Disp: , Rfl:    Magnesium Gluconate 250 MG TABS, Take 1 tablet (250 mg total) by mouth every evening., Disp: 90 tablet, Rfl: 1   Vitamin D, Ergocalciferol, (DRISDOL) 1.25 MG (50000 UNIT) CAPS capsule, Take 1 capsule (50,000 Units total) by mouth every 7 (seven) days., Disp: 12 capsule, Rfl: 3   vortioxetine HBr (TRINTELLIX) 10 MG TABS tablet, Take 1 tablet (10 mg total) by mouth daily., Disp: 90 tablet, Rfl: 1   spironolactone  (ALDACTONE) 25 MG tablet, TAKE 1 TABLET (25 MG TOTAL) BY MOUTH DAILY., Disp: 90 tablet, Rfl: 1   No Known Allergies   Review of Systems  Constitutional: Negative.   Respiratory: Negative.    Cardiovascular: Negative.   Psychiatric/Behavioral: Negative.    All other systems reviewed and are negative.    Today's Vitals   03/29/22 1159 03/29/22 1247  BP: (!) 142/108 (!) 148/112  Pulse: 85   Temp: 98 F (36.7 C)   TempSrc: Oral   SpO2: 94%   Weight: 290 lb (131.5 kg)   Height: '5\' 8"'$  (1.727 m)    Body mass index is 44.09 kg/m.   Objective:  Physical Exam Vitals reviewed.  Constitutional:      General: She is not in acute distress.    Appearance: Normal appearance. She is obese.  Cardiovascular:     Rate and Rhythm: Normal rate and regular rhythm.     Pulses: Normal pulses.     Heart sounds: Normal heart sounds. No murmur heard. Pulmonary:     Effort: Pulmonary effort is normal. No respiratory distress.     Breath sounds: Normal breath sounds. No wheezing.  Skin:    General: Skin is warm and dry.     Capillary Refill: Capillary  refill takes less than 2 seconds.  Neurological:     General: No focal deficit present.     Mental Status: She is alert and oriented to person, place, and time.     Cranial Nerves: No cranial nerve deficit.     Motor: No weakness.  Psychiatric:        Mood and Affect: Mood is not anxious. Affect is flat. Affect is not tearful.        Behavior: Behavior normal.        Thought Content: Thought content normal.        Judgment: Judgment normal.         Assessment And Plan:     1. Uncontrolled hypertension Comments: Blood pressure continues to be elevated, will add spironolactone return in 1 week for NV and BMP check in addition to magnesium gluconate. Continue to have long conversation with her about how she is coping with her stress and the affects it can have on her blood pressure.she is not able to walk as much due to working long hours.  -  Magnesium Gluconate 250 MG TABS; Take 1 tablet (250 mg total) by mouth every evening.  Dispense: 90 tablet; Refill: 1 - BMP8+eGFR; Future  2. Anxiety Comments: She continues to have problems with sleep, she is seeing a therapist at this time. She is back at work. - Magnesium Gluconate 250 MG TABS; Take 1 tablet (250 mg total) by mouth every evening.  Dispense: 90 tablet; Refill: 1  3. History of alopecia areata Comments: Continues to have patches of hair loss, will start on spironolactone as this has helped with hair loss as well.    Patient was given opportunity to ask questions. Patient verbalized understanding of the plan and was able to repeat key elements of the plan. All questions were answered to their satisfaction.  Patricia Brine, FNP   I, Patricia Brine, FNP, have reviewed all documentation for this visit. The documentation on 03/29/22 for the exam, diagnosis, procedures, and orders are all accurate and complete.   IF YOU HAVE BEEN REFERRED TO A SPECIALIST, IT MAY TAKE 1-2 WEEKS TO SCHEDULE/PROCESS THE REFERRAL. IF YOU HAVE NOT HEARD FROM US/SPECIALIST IN TWO WEEKS, PLEASE GIVE [place] A CALL AT [phone].   THE PATIENT IS ENCOURAGED TO PRACTICE SOCIAL DISTANCING DUE TO THE COVID-19 PANDEMIC.

## 2022-04-05 ENCOUNTER — Ambulatory Visit: Payer: BC Managed Care – PPO | Admitting: Psychology

## 2022-04-07 ENCOUNTER — Other Ambulatory Visit: Payer: Self-pay | Admitting: Nurse Practitioner

## 2022-04-07 DIAGNOSIS — I10 Essential (primary) hypertension: Secondary | ICD-10-CM

## 2022-04-10 ENCOUNTER — Ambulatory Visit: Payer: BC Managed Care – PPO

## 2022-04-10 VITALS — BP 126/87 | HR 80 | Temp 98.5°F | Ht 68.0 in | Wt 290.0 lb

## 2022-04-10 DIAGNOSIS — I10 Essential (primary) hypertension: Secondary | ICD-10-CM

## 2022-04-10 LAB — BMP8+EGFR
BUN/Creatinine Ratio: 8 — ABNORMAL LOW (ref 9–23)
BUN: 7 mg/dL (ref 6–24)
CO2: 21 mmol/L (ref 20–29)
Calcium: 9.5 mg/dL (ref 8.7–10.2)
Chloride: 101 mmol/L (ref 96–106)
Creatinine, Ser: 0.91 mg/dL (ref 0.57–1.00)
Glucose: 83 mg/dL (ref 70–99)
Potassium: 4 mmol/L (ref 3.5–5.2)
Sodium: 137 mmol/L (ref 134–144)
eGFR: 82 mL/min/{1.73_m2} (ref >59)

## 2022-04-10 NOTE — Patient Instructions (Signed)
Hypertension, Adult ?Hypertension is another name for high blood pressure. High blood pressure forces your heart to work harder to pump blood. This can cause problems over time. ?There are two numbers in a blood pressure reading. There is a top number (systolic) over a bottom number (diastolic). It is best to have a blood pressure that is below 120/80. ?What are the causes? ?The cause of this condition is not known. Some other conditions can lead to high blood pressure. ?What increases the risk? ?Some lifestyle factors can make you more likely to develop high blood pressure: ?Smoking. ?Not getting enough exercise or physical activity. ?Being overweight. ?Having too much fat, sugar, calories, or salt (sodium) in your diet. ?Drinking too much alcohol. ?Other risk factors include: ?Having any of these conditions: ?Heart disease. ?Diabetes. ?High cholesterol. ?Kidney disease. ?Obstructive sleep apnea. ?Having a family history of high blood pressure and high cholesterol. ?Age. The risk increases with age. ?Stress. ?What are the signs or symptoms? ?High blood pressure may not cause symptoms. Very high blood pressure (hypertensive crisis) may cause: ?Headache. ?Fast or uneven heartbeats (palpitations). ?Shortness of breath. ?Nosebleed. ?Vomiting or feeling like you may vomit (nauseous). ?Changes in how you see. ?Very bad chest pain. ?Feeling dizzy. ?Seizures. ?How is this treated? ?This condition is treated by making healthy lifestyle changes, such as: ?Eating healthy foods. ?Exercising more. ?Drinking less alcohol. ?Your doctor may prescribe medicine if lifestyle changes do not help enough and if: ?Your top number is above 130. ?Your bottom number is above 80. ?Your personal target blood pressure may vary. ?Follow these instructions at home: ?Eating and drinking ? ?If told, follow the DASH eating plan. To follow this plan: ?Fill one half of your plate at each meal with fruits and vegetables. ?Fill one fourth of your plate  at each meal with whole grains. Whole grains include whole-wheat pasta, brown rice, and whole-grain bread. ?Eat or drink low-fat dairy products, such as skim milk or low-fat yogurt. ?Fill one fourth of your plate at each meal with low-fat (lean) proteins. Low-fat proteins include fish, chicken without skin, eggs, beans, and tofu. ?Avoid fatty meat, cured and processed meat, or chicken with skin. ?Avoid pre-made or processed food. ?Limit the amount of salt in your diet to less than 1,500 mg each day. ?Do not drink alcohol if: ?Your doctor tells you not to drink. ?You are pregnant, may be pregnant, or are planning to become pregnant. ?If you drink alcohol: ?Limit how much you have to: ?0-1 drink a day for women. ?0-2 drinks a day for men. ?Know how much alcohol is in your drink. In the [place] one drink equals one 12 oz bottle of beer (355 mL), one 5 oz glass of wine (148 mL), or one 1? oz glass of hard liquor (44 mL). ?Lifestyle ? ?Work with your doctor to stay at a healthy weight or to lose weight. Ask your doctor what the best weight is for you. ?Get at least 30 minutes of exercise that causes your heart to beat faster (aerobic exercise) most days of the week. This may include walking, swimming, or biking. ?Get at least 30 minutes of exercise that strengthens your muscles (resistance exercise) at least 3 days a week. This may include lifting weights or doing Pilates. ?Do not smoke or use any products that contain nicotine or tobacco. If you need help quitting, ask your doctor. ?Check your blood pressure at home as told by your doctor. ?Keep all follow-up visits. ?Medicines ?Take over-the-counter and prescription medicines   only as told by your doctor. Follow directions carefully. ?Do not skip doses of blood pressure medicine. The medicine does not work as well if you skip doses. Skipping doses also puts you at risk for problems. ?Ask your doctor about side effects or reactions to medicines that you should watch  for. ?Contact a doctor if: ?You think you are having a reaction to the medicine you are taking. ?You have headaches that keep coming back. ?You feel dizzy. ?You have swelling in your ankles. ?You have trouble with your vision. ?Get help right away if: ?You get a very bad headache. ?You start to feel mixed up (confused). ?You feel weak or numb. ?You feel faint. ?You have very bad pain in your: ?Chest. ?Belly (abdomen). ?You vomit more than once. ?You have trouble breathing. ?These symptoms may be an emergency. Get help right away. Call 911. ?Do not wait to see if the symptoms will go away. ?Do not drive yourself to the hospital. ?Summary ?Hypertension is another name for high blood pressure. ?High blood pressure forces your heart to work harder to pump blood. ?For most people, a normal blood pressure is less than 120/80. ?Making healthy choices can help lower blood pressure. If your blood pressure does not get lower with healthy choices, you may need to take medicine. ?This information is not intended to replace advice given to you by your health care provider. Make sure you discuss any questions you have with your health care provider. ?Document Revised: 11/17/2020 Document Reviewed: 11/17/2020 ?Elsevier Patient Education ? 2023 Elsevier Inc. ? ?

## 2022-04-10 NOTE — Progress Notes (Signed)
Patient presents today for a BP check and labs. Patient currently taking amLODipine '10mg'$ , Spironolactone '25mg'$ s. BP Readings from Last 3 Encounters:  04/10/22 120/87  03/29/22 (!) 148/112  03/13/22 128/78  Per provider--this is much better. tell her to continue to limit salt and medications.

## 2022-04-18 ENCOUNTER — Ambulatory Visit (INDEPENDENT_AMBULATORY_CARE_PROVIDER_SITE_OTHER): Payer: BC Managed Care – PPO | Admitting: Psychology

## 2022-04-18 DIAGNOSIS — F418 Other specified anxiety disorders: Secondary | ICD-10-CM

## 2022-04-18 DIAGNOSIS — F3289 Other specified depressive episodes: Secondary | ICD-10-CM

## 2022-04-18 NOTE — Progress Notes (Unsigned)
[facility] Counselor/Therapist Progress Note  Patient ID: Patricia Boyer, MRN: [id number]   Date: 04/18/22  Time Spent: 3:02  pm - 3:51 pm : 49 Minutes  Treatment Type: Individual Therapy.  Reported Symptoms: Depression and Anxiety.   Mental Status Exam: Appearance:  Casual     Behavior: Appropriate  Motor: Normal  Speech/Language:  Clear and Coherent  Affect: Congruent  Mood: anxious and depressed  Thought process: normal  Thought content:   WNL  Sensory/Perceptual disturbances:   WNL  Orientation: oriented to person, place, time/date, and situation  Attention: Good  Concentration: Good  Memory: WNL  Fund of knowledge:  Good  Insight:   Good  Judgment:  Good  Impulse Control: Good   Risk Assessment: Danger to Self:  No Self-injurious Behavior: No Danger to Others: No Duty to Warn:no Physical Aggression / Violence:No  Access to Firearms a concern: No  Gang Involvement:No   Subjective:   Patricia Boyer participated from home, via video and consented to treatment. Therapist participated from home office. We met online due to [place] pandemic. Patricia Boyer noted the events the past two weeks. She is working on doing her best with her work situation. She noted working on giving feedback to her job regarding tasks and noted this going poorly despite her diplomatic and positive approach. She noted continued frustration regarding her treatment of her and feeling "gaslit" by her supervisor and the supervisor's supervisor. She noted continued difficulty receiving the necessary resources to complete her job, unclear work expectations and objectives, and a general lack of empathy and support. She noted often wondering "why" she is being treated in that manner and not receiving support and resources necessary. We continued to process this during the session. We worked on [place], being assertive, and documenting her interactions. We worked on managing and limiting  worry, focusing on positives in life, and reducing rumination. Therapist validated and normalized Patricia Boyer's feelings and experience and provided supportive therapy. We scheduled follow-up sessions for continued treatment.   Interventions: CBT & interpersonal  Diagnosis:  Other depression  Other specified anxiety disorders  Treatment Plan:  Client Abilities/Strengths Patricia Boyer is intelligent, highly educated, and motivated for change.   Support System: Family and friends.   Client Treatment Preferences Outpatient Therapy  Client Statement of Needs Patricia Boyer would like to manage her symptoms, process past events, improve her mood, address current stressors   Treatment Level Weekly  Symptoms  Depression:  loss of interest, feeling depressed and hopeless, hypersomnia & middle insomnia, feeling bad about self, over eating, feeling bad about self, trouble concentrating, psychomotor agitation.  (Status: maintained) Anxiety: Feeling anxious, difficulty managing worry, worrying about different things, trouble relaxing, restlessness, irritability, feeling afraid something awful might happen  (Status: maintained)  Goals:   Patricia Boyer experiences symptoms of depression and anxiety.    Target Date: 12/13/22 Frequency: Weekly  Progress: 0 Modality: individual    Therapist will provide referrals for additional resources as appropriate.  Therapist will provide psycho-education regarding Patricia Boyer diagnosis and corresponding treatment approaches and interventions. Licensed Clinical Social Worker, Patricia Border, LCSW will support the patient's ability to achieve the goals identified. will employ CBT, BA, Problem-solving, Solution Focused, Mindfulness,  coping skills, & other evidenced-based practices will be used to promote progress towards healthy functioning to help manage decrease symptoms associated with her diagnosis.   Reduce overall level, frequency, and intensity of the feelings of depression,  anxiety and panic evidenced by decreased overall symptoms from 6 to 7 days/week to  0 to 1 days/week per client report for at least 3 consecutive months. Verbally express understanding of the relationship between feelings of depression, anxiety and their impact on thinking patterns and behaviors. Verbalize an understanding of the role that distorted thinking plays in creating fears, excessive worry, and ruminations.   Patricia Boyer participated in the creation of the treatment plan)   [name], LCSW

## 2022-04-19 ENCOUNTER — Ambulatory Visit: Payer: BC Managed Care – PPO | Admitting: Psychology

## 2022-05-16 ENCOUNTER — Ambulatory Visit (INDEPENDENT_AMBULATORY_CARE_PROVIDER_SITE_OTHER): Payer: BC Managed Care – PPO | Admitting: Psychology

## 2022-05-16 DIAGNOSIS — F418 Other specified anxiety disorders: Secondary | ICD-10-CM

## 2022-05-16 DIAGNOSIS — F3289 Other specified depressive episodes: Secondary | ICD-10-CM | POA: Diagnosis not present

## 2022-05-16 NOTE — Progress Notes (Signed)
[facility] Counselor/Therapist Progress Note  Patient ID: Patricia Boyer, MRN: [id number]   Date: 05/16/22  Time Spent: 8:04  am - 9:01 am : 9 Minutes  Treatment Type: Individual Therapy.  Reported Symptoms: Depression and Anxiety.   Mental Status Exam: Appearance:  Casual     Behavior: Appropriate  Motor: Normal  Speech/Language:  Clear and Coherent  Affect: Congruent  Mood: anxious and depressed  Thought process: normal  Thought content:   WNL  Sensory/Perceptual disturbances:   WNL  Orientation: oriented to person, place, time/date, and situation  Attention: Good  Concentration: Good  Memory: WNL  Fund of knowledge:  Good  Insight:   Good  Judgment:  Good  Impulse Control: Good   Risk Assessment: Danger to Self:  No Self-injurious Behavior: No Danger to Others: No Duty to Warn:no Physical Aggression / Violence:No  Access to Firearms a concern: No  Gang Involvement:No   Subjective:   Del[place] participated from home, via video and consented to treatment. Therapist participated from home office. We met online due to [place] pandemic. Zelie noted the events the past two weeks. She noted continued stagnation in regards to her stressors at work and a lack of traction in regards to improvement. She noted the upcoming sale of house and noted significant contention regarding the sale due to her father's nominal investment into the home and his insistence that he receive the bulk of the funds after the sale of the house. We explored this during the session. Ellenie provided history regarding her dynamics with her father and the stress she experiences when her father is upset. She described her father as controlling and judgmental. We explored this during the session and her avoidance of setting boundaries with her father to  avoid stress. We worked on identifying her mood currently and noted that her avoidence is contributing to her own stress. Therapist  encouraged Zimora to identify her goal for the sale of the home, seek outside advice from an expert, and think of the shot and long-term ramifications of her decisions. Therapist encouraged Hilliary to engage in self-care, focus on areas of control, and maintain her boundaries. Therapist validated Sehaj's feelings and experience and provided supportive therapy.   Interventions: CBT & interpersonal  Diagnosis:  Other depression  Other specified anxiety disorders  Treatment Plan:  Client Abilities/Strengths Dianelis is intelligent, highly educated, and motivated for change.   Support System: Family and friends.   Client Treatment Preferences Outpatient Therapy  Client Statement of Needs Carmellia would like to manage her symptoms, process past events, improve her mood, address current stressors   Treatment Level Weekly  Symptoms  Depression:  loss of interest, feeling depressed and hopeless, hypersomnia & middle insomnia, feeling bad about self, over eating, feeling bad about self, trouble concentrating, psychomotor agitation.  (Status: maintained) Anxiety: Feeling anxious, difficulty managing worry, worrying about different things, trouble relaxing, restlessness, irritability, feeling afraid something awful might happen  (Status: maintained)  Goals:   Rosella experiences symptoms of depression and anxiety.    Target Date: 12/13/22 Frequency: Weekly  Progress: 0 Modality: individual    Therapist will provide referrals for additional resources as appropriate.  Therapist will provide psycho-education regarding Kayleann's diagnosis and corresponding treatment approaches and interventions. Licensed Clinical Social Worker, [place], LCSW will support the patient's ability to achieve the goals identified. will employ CBT, BA, Problem-solving, Solution Focused, Mindfulness,  coping skills, & other evidenced-based practices will be used to promote progress towards healthy functioning  to  help manage decrease symptoms associated with her diagnosis.   Reduce overall level, frequency, and intensity of the feelings of depression, anxiety and panic evidenced by decreased overall symptoms from 6 to 7 days/week to 0 to 1 days/week per client report for at least 3 consecutive months. Verbally express understanding of the relationship between feelings of depression, anxiety and their impact on thinking patterns and behaviors. Verbalize an understanding of the role that distorted thinking plays in creating fears, excessive worry, and ruminations.   [name] participated in the creation of the treatment plan)   [name], LCSW

## 2022-06-07 ENCOUNTER — Ambulatory Visit: Payer: BC Managed Care – PPO | Admitting: Psychology

## 2022-06-07 DIAGNOSIS — F3289 Other specified depressive episodes: Secondary | ICD-10-CM | POA: Diagnosis not present

## 2022-06-07 DIAGNOSIS — F418 Other specified anxiety disorders: Secondary | ICD-10-CM

## 2022-06-07 NOTE — Progress Notes (Signed)
[facility] Counselor/Therapist Progress Note  Patient ID: Patricia Boyer, MRN: [id number]   Date: 06/07/22  Time Spent: 8:04  am - 9:02am : 58 Minutes  Treatment Type: Individual Therapy.  Reported Symptoms: Depression and Anxiety.   Mental Status Exam: Appearance:  Casual     Behavior: Appropriate  Motor: Normal  Speech/Language:  Clear and Coherent  Affect: Congruent  Mood: anxious and depressed  Thought process: normal  Thought content:   WNL  Sensory/Perceptual disturbances:   WNL  Orientation: oriented to person, place, time/date, and situation  Attention: Good  Concentration: Good  Memory: WNL  Fund of knowledge:  Good  Insight:   Good  Judgment:  Good  Impulse Control: Good   Risk Assessment: Danger to Self:  No Self-injurious Behavior: No Danger to Others: No Duty to Warn:no Physical Aggression / Violence:No  Access to Firearms a concern: No  Gang Involvement:No   Subjective:   Patricia Boyer participated from home, via video and consented to treatment. Therapist participated from home office. We met online due to COVID pandemic. Patricia Boyer noted the events the past two weeks. She noted working on boundaries, using [employer], and noted her intent to continue to engage in this manner. She was tearful during the session recalling a meeting with an HR supervisor and noted this meeting having an antagonistic approach by said HR person. She noted her frustration regarding her boss's and HR supervisor's approach and general demeanor. We worked on identifying boundaries, going forward, and ways to disengage from antagonistic behavior. She noted attempting to share her experience and feeling discounted and disregarded. She noted selling the home that was jointly owned by her and her father and mother, of which she has contributed the majority of the payments, and noted her father giving her $10,000 out of the proceeds of the home. We explored this, her  feelings of frustration towards her situation, and her worry regarding her father's reaction if she would push back. She provided additional history regarding their dynamics and her father's behavior. We worked on identifying feelings, what she would like to do to address this issue, and identify possible options for action. Therapist validated and normalized Patricia Boyer's feelings and experience and provided supportive therapy.   Interventions: CBT & interpersonal  Diagnosis:  Other depression  Other specified anxiety disorders  Treatment Plan:  Client Abilities/Strengths Patricia Boyer is intelligent, highly educated, and motivated for change.   Support System: Family and friends.   Client Treatment Preferences Outpatient Therapy  Client Statement of Needs Patricia Boyer would like to manage her symptoms, process past events, improve her mood, address current stressors   Treatment Level Weekly  Symptoms  Depression:  loss of interest, feeling depressed and hopeless, hypersomnia & middle insomnia, feeling bad about self, over eating, feeling bad about self, trouble concentrating, psychomotor agitation.  (Status: maintained) Anxiety: Feeling anxious, difficulty managing worry, worrying about different things, trouble relaxing, restlessness, irritability, feeling afraid something awful might happen  (Status: maintained)  Goals:   Patricia Boyer experiences symptoms of depression and anxiety.    Target Date: 12/13/22 Frequency: Weekly  Progress: 0 Modality: individual    Therapist will provide referrals for additional resources as appropriate.  Therapist will provide psycho-education regarding Patricia Boyer's diagnosis and corresponding treatment approaches and interventions. Licensed Clinical Social Worker, [place], LCSW will support the patient's ability to achieve the goals identified. will employ CBT, BA, Problem-solving, Solution Focused, Mindfulness,  coping skills, & other evidenced-based practices  will be used to  promote progress towards healthy functioning to help manage decrease symptoms associated with her diagnosis.   Reduce overall level, frequency, and intensity of the feelings of depression, anxiety and panic evidenced by decreased overall symptoms from 6 to 7 days/week to 0 to 1 days/week per client report for at least 3 consecutive months. Verbally express understanding of the relationship between feelings of depression, anxiety and their impact on thinking patterns and behaviors. Verbalize an understanding of the role that distorted thinking plays in creating fears, excessive worry, and ruminations.   Patricia Boyer participated in the creation of the treatment plan)   Patricia Ovens, LCSW

## 2022-06-19 ENCOUNTER — Ambulatory Visit (INDEPENDENT_AMBULATORY_CARE_PROVIDER_SITE_OTHER): Payer: BC Managed Care – PPO | Admitting: Psychology

## 2022-06-19 DIAGNOSIS — F3289 Other specified depressive episodes: Secondary | ICD-10-CM | POA: Diagnosis not present

## 2022-06-19 DIAGNOSIS — F418 Other specified anxiety disorders: Secondary | ICD-10-CM | POA: Diagnosis not present

## 2022-06-19 NOTE — Progress Notes (Unsigned)
[facility] Counselor/Therapist Progress Note  Patient ID: LORRI DIPRIMA, MRN: [id number]   Date: 06/19/22  Time Spent: 5:05  pm - 5:59 pm : 53 Minutes  Treatment Type: Individual Therapy.  Reported Symptoms: Depression and Anxiety.   Mental Status Exam: Appearance:  Casual     Behavior: Appropriate  Motor: Normal  Speech/Language:  Clear and Coherent  Affect: Congruent  Mood: anxious and depressed  Thought process: normal  Thought content:   WNL  Sensory/Perceptual disturbances:   WNL  Orientation: oriented to person, place, time/date, and situation  Attention: Good  Concentration: Good  Memory: WNL  Fund of knowledge:  Good  Insight:   Good  Judgment:  Good  Impulse Control: Good   Risk Assessment: Danger to Self:  No Self-injurious Behavior: No Danger to Others: No Duty to Warn:no Physical Aggression / Violence:No  Access to Firearms a concern: No  Gang Involvement:No   Subjective:   [name] participated from home, via video and consented to treatment. Therapist participated from home office. We met online due to COVID pandemic. Deidra noted the events the past two weeks. Sung noted her efforts to set boundaries for self at work in relation to rumination. Therapist praised [place] for her effort to manage her boundaries. She noted her frustration and anger towards her father due to the sale of a jointly owned house of which she invested the bulk and her father has kept 100% of the proceeds. She noted her father being overly critical & seeks control.  She noted feeling anxiety when she receives calls and texts from his father. She noted feeling on edge and experiencing nausea. We worked on identifying strict boundaries, minimizing communication, maintaining consistency, and disconnect from areas that cause stress in the relationship. She noted her father being overly critical, controlling, and often injects himself in people's decisions, financial  and otherwise. We worked on processing this, the effect of this on her mood. Therapist validated and normalized [name]'s feelings and experience. We will continue to process this going forward. Therapist validated and normalized Camia's feelings and experience and provided supportive therapy.   Interventions: CBT & interpersonal  Diagnosis:  Other depression  Other specified anxiety disorders  Treatment Plan:  Client Abilities/Strengths Charnese is intelligent, highly educated, and motivated for change.   Support System: Family and friends.   Client Treatment Preferences Outpatient Therapy  Client Statement of Needs Kailany would like to manage her symptoms, process past events, improve her mood, address current stressors   Treatment Level Weekly  Symptoms  Depression:  loss of interest, feeling depressed and hopeless, hypersomnia & middle insomnia, feeling bad about self, over eating, feeling bad about self, trouble concentrating, psychomotor agitation.  (Status: maintained) Anxiety: Feeling anxious, difficulty managing worry, worrying about different things, trouble relaxing, restlessness, irritability, feeling afraid something awful might happen  (Status: maintained)  Goals:   Euphemia experiences symptoms of depression and anxiety.    Target Date: 12/13/22 Frequency: Weekly  Progress: 0 Modality: individual    Therapist will provide referrals for additional resources as appropriate.  Therapist will provide psycho-education regarding Ilo's diagnosis and corresponding treatment approaches and interventions. Licensed Clinical Social Worker, [place], LCSW will support the patient's ability to achieve the goals identified. will employ CBT, BA, Problem-solving, Solution Focused, Mindfulness,  coping skills, & other evidenced-based practices will be used to promote progress towards healthy functioning to help manage decrease symptoms associated with her diagnosis.    Reduce overall level, frequency, and intensity  of the feelings of depression, anxiety and panic evidenced by decreased overall symptoms from 6 to 7 days/week to 0 to 1 days/week per client report for at least 3 consecutive months. Verbally express understanding of the relationship between feelings of depression, anxiety and their impact on thinking patterns and behaviors. Verbalize an understanding of the role that distorted thinking plays in creating fears, excessive worry, and ruminations.   [name] participated in the creation of the treatment plan)   [name], LCSW

## 2022-06-27 ENCOUNTER — Ambulatory Visit: Payer: BC Managed Care – PPO | Admitting: Nurse Practitioner

## 2022-06-27 ENCOUNTER — Encounter: Payer: Self-pay | Admitting: Nurse Practitioner

## 2022-06-27 VITALS — BP 136/80 | HR 85 | Temp 98.4°F | Ht 68.0 in | Wt 295.2 lb

## 2022-06-27 DIAGNOSIS — M25561 Pain in right knee: Secondary | ICD-10-CM

## 2022-06-27 DIAGNOSIS — M25562 Pain in left knee: Secondary | ICD-10-CM

## 2022-06-27 DIAGNOSIS — Z6841 Body Mass Index (BMI) 40.0 and over, adult: Secondary | ICD-10-CM

## 2022-06-27 DIAGNOSIS — F3342 Major depressive disorder, recurrent, in full remission: Secondary | ICD-10-CM

## 2022-06-27 DIAGNOSIS — I10 Essential (primary) hypertension: Secondary | ICD-10-CM

## 2022-06-27 NOTE — Progress Notes (Signed)
Patricia Boyer,acting as a Neurosurgeon for Patricia Felts, FNP.,have documented all relevant documentation on the behalf of Patricia Felts, FNP,as directed by  Patricia Felts, FNP while in the presence of Patricia Felts, FNP.    Subjective:     Patient ID: Patricia Boyer , female    DOB: [birth year] , 42 y.o.   MRN: [id number]   Chief Complaint  Patient presents with   Hypertension    HPI  Patient presents for a BP follow up. Patient reports compliance with medications and has no other concerns today. She started exercising 3 times a week for 40 minutes. When she tries to lunge she has not been able to. She is having difficulty with her knees. She is eating better - took out sugar and candy, no sodas. She is reducing the amount of meat. She is trying not to eat after 8p. She will wake up hungry.   Wt Readings from Last 3 Encounters: 06/27/22 : 295 lb 3.2 oz (133.9 kg) 04/10/22 : 290 lb (131.5 kg) 03/29/22 : 290 lb (131.5 kg)    BP Readings from Last 3 Encounters: 06/27/22 : 136/80 04/10/22 : 126/87 03/29/22 : Marland Kitchen 148/112       Past Medical History:  Diagnosis Date   Anemia    Arthritis    Cataract    GERD (gastroesophageal reflux disease)      Family History  Problem Relation Age of Onset   Hypertension Mother    Sleep apnea Mother      Current Outpatient Medications:    amLODipine (NORVASC) 10 MG tablet, Take 1 tablet (10 mg total) by mouth daily., Disp: 90 tablet, Rfl: 1   busPIRone (BUSPAR) 5 MG tablet, Take 1 tablet (5 mg total) by mouth daily., Disp: 90 tablet, Rfl: 1   clobetasol (TEMOVATE) 0.05 % external solution, Apply topically 2 (two) times daily., Disp: , Rfl:    Magnesium Gluconate 250 MG TABS, Take 1 tablet (250 mg total) by mouth every evening., Disp: 90 tablet, Rfl: 1   spironolactone (ALDACTONE) 25 MG tablet, TAKE 1 TABLET (25 MG TOTAL) BY MOUTH DAILY., Disp: 90 tablet, Rfl: 1   Vitamin D, Ergocalciferol, (DRISDOL) 1.25 MG (50000 UNIT) CAPS capsule, Take  1 capsule (50,000 Units total) by mouth every 7 (seven) days., Disp: 12 capsule, Rfl: 3   vortioxetine HBr (TRINTELLIX) 10 MG TABS tablet, Take 1 tablet (10 mg total) by mouth daily., Disp: 90 tablet, Rfl: 1   No Known Allergies   Review of Systems  Constitutional: Negative.   Respiratory: Negative.    Cardiovascular: Negative.   Musculoskeletal:        Knee pain right greater than left.   Neurological: Negative.   Psychiatric/Behavioral: Negative.       Today's Vitals   06/27/22 1136  BP: 136/80  Pulse: 85  Temp: 98.4 F (36.9 C)  TempSrc: Oral  Weight: 295 lb 3.2 oz (133.9 kg)  Height: 5\' 8"  (1.727 m)   Body mass index is 44.89 kg/m.  Wt Readings from Last 3 Encounters:  06/27/22 295 lb 3.2 oz (133.9 kg)  04/10/22 290 lb (131.5 kg)  03/29/22 290 lb (131.5 kg)    The 10-year ASCVD risk score (Arnett DK, et al., 2019) is: 0.9%   Values used to calculate the score:     Age: 29 years     Sex: Female     Is Non-Hispanic African American: Yes     Diabetic: No     Tobacco smoker:  No     Systolic Blood Pressure: 136 mmHg     Is BP treated: Yes     HDL Cholesterol: 75 mg/dL     Total Cholesterol: 173 mg/dL  Objective:  Physical Exam Vitals reviewed.  Constitutional:      General: She is not in acute distress.    Appearance: Normal appearance. She is obese.  Cardiovascular:     Rate and Rhythm: Normal rate and regular rhythm.     Pulses: Normal pulses.     Heart sounds: Normal heart sounds. No murmur heard. Pulmonary:     Effort: Pulmonary effort is normal. No respiratory distress.     Breath sounds: Normal breath sounds. No wheezing.  Skin:    General: Skin is warm and dry.     Capillary Refill: Capillary refill takes less than 2 seconds.  Neurological:     General: No focal deficit present.     Mental Status: She is alert and oriented to person, place, and time.     Cranial Nerves: No cranial nerve deficit.     Motor: No weakness.  Psychiatric:        Mood  and Affect: Mood is not anxious. Affect is flat. Affect is not tearful.        Behavior: Behavior normal.        Thought Content: Thought content normal.        Judgment: Judgment normal.         Assessment And Plan:     1. Essential hypertension Comments: Blood pressure is fairly controlled, continue current medications. It is better than previously - Basic metabolic panel  2. Recurrent major depressive disorder, in full remission (HCC) Comments: Continue with f/u with therapist. Seems to be doing better.  3. Acute pain of both knees Comments: 1 week history of pain, she has been exercising more, encouraged to rest for 1-2 weeks and increase activity slowly. If not better will obtain xrays/refer?  4. Class 3 severe obesity due to excess calories without serious comorbidity with body mass index (BMI) of 40.0 to 44.9 in adult [facility]) Comments: Encouraged to increase intake of protein this may help with feeling full.    Return if symptoms worsen or fail to improve.  Patient was given opportunity to ask questions. Patient verbalized understanding of the plan and was able to repeat key elements of the plan. All questions were answered to their satisfaction.  Patricia Felts, FNP   I, Patricia Felts, FNP, have reviewed all documentation for this visit. The documentation on 06/27/22 for the exam, diagnosis, procedures, and orders are all accurate and complete.   IF YOU HAVE BEEN REFERRED TO A SPECIALIST, IT MAY TAKE 1-2 WEEKS TO SCHEDULE/PROCESS THE REFERRAL. IF YOU HAVE NOT HEARD FROM US/SPECIALIST IN TWO WEEKS, PLEASE GIVE [place] A CALL AT [phone] X [phone].   THE PATIENT IS ENCOURAGED TO PRACTICE SOCIAL DISTANCING DUE TO THE COVID-19 PANDEMIC.

## 2022-06-28 LAB — BASIC METABOLIC PANEL
BUN/Creatinine Ratio: 11 (ref 9–23)
BUN: 10 mg/dL (ref 6–24)
CO2: 23 mmol/L (ref 20–29)
Calcium: 9.4 mg/dL (ref 8.7–10.2)
Chloride: 105 mmol/L (ref 96–106)
Creatinine, Ser: 0.9 mg/dL (ref 0.57–1.00)
Glucose: 70 mg/dL (ref 70–99)
Potassium: 4.6 mmol/L (ref 3.5–5.2)
Sodium: 140 mmol/L (ref 134–144)
eGFR: 83 mL/min/{1.73_m2} (ref >59)

## 2022-07-10 ENCOUNTER — Encounter: Payer: Self-pay | Admitting: Nurse Practitioner

## 2022-07-11 ENCOUNTER — Ambulatory Visit (INDEPENDENT_AMBULATORY_CARE_PROVIDER_SITE_OTHER): Payer: BC Managed Care – PPO | Admitting: Psychology

## 2022-07-11 DIAGNOSIS — F418 Other specified anxiety disorders: Secondary | ICD-10-CM

## 2022-07-11 DIAGNOSIS — F3289 Other specified depressive episodes: Secondary | ICD-10-CM | POA: Diagnosis not present

## 2022-07-11 NOTE — Progress Notes (Signed)
[facility] Counselor/Therapist Progress Note  Patient ID: SINDI DUDDY, MRN: [id number]   Date: 07/11/22  Time Spent: 9:05  am - 10:01 am : 56 Minutes  Treatment Type: Individual Therapy.  Reported Symptoms: Depression and Anxiety.   Mental Status Exam: Appearance:  Casual     Behavior: Appropriate  Motor: Normal  Speech/Language:  Clear and Coherent  Affect: Congruent  Mood: anxious  Thought process: normal  Thought content:   WNL  Sensory/Perceptual disturbances:   WNL  Orientation: oriented to person, place, time/date, and situation  Attention: Good  Concentration: Good  Memory: WNL  Fund of knowledge:  Good  Insight:   Good  Judgment:  Good  Impulse Control: Good   Risk Assessment: Danger to Self:  No Self-injurious Behavior: No Danger to Others: No Duty to Warn:no Physical Aggression / Violence:No  Access to Firearms a concern: No  Gang Involvement:No   Subjective:   Patricia Boyer participated from home, via video and consented to treatment. Therapist participated from home office. We met online due to COVID pandemic. Patricia Boyer noted the events the past two weeks. Patricia Boyer noted experiencing excitement regarding two upcoming job interviews. She noted difficulty focusing on her presentation for one of the interviews. We processed this during the session and her anxiety regarding her upcoming interview. She noted continued efforts to set boundaries with work and we worked on exploring this during the session. We worked on identifying boundaries during the session. Therapist modeled boundary setting during the session. Patricia Boyer noted her father paying her a nominal amount out of the proceeds of her house sale. We continued to process this and she noted her interest in discussing this issue with her father. She provided additional background regarding their relationship. Therapist encouraged Patricia Boyer to identify her goals for her conversation, talking points, and  ways to manage the distress of a stressful relationship. We will process this going forward during our follow-up.Therapist validated and normalized Patricia Boyer's feelings and experience. Therapist validated and normalized Patricia Boyer's feelings and experience and provided supportive therapy.   Interventions: CBT & interpersonal  Diagnosis:  Other depression  Other specified anxiety disorders  Treatment Plan:  Client Abilities/Strengths Patricia Boyer is intelligent, highly educated, and motivated for change.   Support System: Family and friends.   Client Treatment Preferences Outpatient Therapy  Client Statement of Needs Patricia Boyer would like to manage her symptoms, process past events, improve her mood, address current stressors   Treatment Level Weekly  Symptoms  Depression:  loss of interest, feeling depressed and hopeless, hypersomnia & middle insomnia, feeling bad about self, over eating, feeling bad about self, trouble concentrating, psychomotor agitation.  (Status: maintained) Anxiety: Feeling anxious, difficulty managing worry, worrying about different things, trouble relaxing, restlessness, irritability, feeling afraid something awful might happen  (Status: maintained)  Goals:   Patricia Boyer experiences symptoms of depression and anxiety.    Target Date: 12/13/22 Frequency: Weekly  Progress: 0 Modality: individual    Therapist will provide referrals for additional resources as appropriate.  Therapist will provide psycho-education regarding Lataria's diagnosis and corresponding treatment approaches and interventions. Licensed Clinical Social Worker, [place], LCSW will support the patient's ability to achieve the goals identified. will employ CBT, BA, Problem-solving, Solution Focused, Mindfulness,  coping skills, & other evidenced-based practices will be used to promote progress towards healthy functioning to help manage decrease symptoms associated with her diagnosis.   Reduce overall  level, frequency, and intensity of the feelings of depression, anxiety and panic evidenced by decreased overall  symptoms from 6 to 7 days/week to 0 to 1 days/week per client report for at least 3 consecutive months. Verbally express understanding of the relationship between feelings of depression, anxiety and their impact on thinking patterns and behaviors. Verbalize an understanding of the role that distorted thinking plays in creating fears, excessive worry, and ruminations.   [name] participated in the creation of the treatment plan)   [name], LCSW

## 2022-07-25 ENCOUNTER — Ambulatory Visit (INDEPENDENT_AMBULATORY_CARE_PROVIDER_SITE_OTHER): Payer: BC Managed Care – PPO | Admitting: Psychology

## 2022-07-25 DIAGNOSIS — F3289 Other specified depressive episodes: Secondary | ICD-10-CM

## 2022-07-25 DIAGNOSIS — F418 Other specified anxiety disorders: Secondary | ICD-10-CM

## 2022-07-25 NOTE — Progress Notes (Signed)
[facility] Counselor/Therapist Progress Note  Patient ID: Patricia Boyer, MRN: [id number]   Date: 07/25/22  Time Spent: 8:07  am - 8:57 am : 50 Minutes  Treatment Type: Individual Therapy.  Reported Symptoms: Depression and Anxiety.   Mental Status Exam: Appearance:  Casual     Behavior: Appropriate  Motor: Normal  Speech/Language:  Clear and Coherent  Affect: Congruent  Mood: anxious  Thought process: normal  Thought content:   WNL  Sensory/Perceptual disturbances:   WNL  Orientation: oriented to person, place, time/date, and situation  Attention: Good  Concentration: Good  Memory: WNL  Fund of knowledge:  Good  Insight:   Good  Judgment:  Good  Impulse Control: Good   Risk Assessment: Danger to Self:  No Self-injurious Behavior: No Danger to Others: No Duty to Warn:no Physical Aggression / Violence:No  Access to Firearms a concern: No  Gang Involvement:No   Subjective:   Patricia Boyer participated from home, via video and consented to treatment. Therapist participated from home office. We met online due to COVID pandemic. Patricia Boyer noted the events the past two weeks. She noted undergoing an arduous process. We worked on processing this during the session and her disclosed anxiety regarding this. Therapist normalized experiencing some anxiety in relation to job interviews and awaiting decisions. She noted he father's controlling nature and his consistent attempts to insert himself in her life decisions including finances, relationship, and general life decisions.  We worked on identifying boundaries and therapist encouraged Patricia Boyer to continue her work with boundary setting and consistency regarding.  Therapist modeled this during the session.  Patricia Boyer provided additional history regarding her father's behavior towards family and his attempts to control various people and situations.  She noted the distress that she experiences as a result of his behavior as  well as at for attempt to set boundaries and his reaction.  We will continue to process this going forward.  Therapist validated and normalized Patricia Boyer feelings and experience.  And provided supportive therapy follow-up was scheduled for continued treatment  Interventions: CBT & interpersonal  Diagnosis:  Other depression  Other specified anxiety disorders  Treatment Plan:  Client Abilities/Strengths Patricia Boyer is intelligent, highly educated, and motivated for change.   Support System: Family and friends.   Client Treatment Preferences Outpatient Therapy  Client Statement of Needs Patricia Boyer would like to manage her symptoms, process past events, improve her mood, address current stressors   Treatment Level Weekly  Symptoms  Depression:  loss of interest, feeling depressed and hopeless, hypersomnia & middle insomnia, feeling bad about self, over eating, feeling bad about self, trouble concentrating, psychomotor agitation.  (Status: maintained) Anxiety: Feeling anxious, difficulty managing worry, worrying about different things, trouble relaxing, restlessness, irritability, feeling afraid something awful might happen  (Status: maintained)  Goals:   Patricia Boyer experiences symptoms of depression and anxiety.    Target Date: 12/13/22 Frequency: Weekly  Progress: 0 Modality: individual    Therapist will provide referrals for additional resources as appropriate.  Therapist will provide psycho-education regarding Patricia Boyer diagnosis and corresponding treatment approaches and interventions. Licensed Clinical Social Worker, [place], LCSW will support the patient's ability to achieve the goals identified. will employ CBT, BA, Problem-solving, Solution Focused, Mindfulness,  coping skills, & other evidenced-based practices will be used to promote progress towards healthy functioning to help manage decrease symptoms associated with her diagnosis.   Reduce overall level, frequency, and  intensity of the feelings of depression, anxiety and panic evidenced by decreased overall  symptoms from 6 to 7 days/week to 0 to 1 days/week per client report for at least 3 consecutive months. Verbally express understanding of the relationship between feelings of depression, anxiety and their impact on thinking patterns and behaviors. Verbalize an understanding of the role that distorted thinking plays in creating fears, excessive worry, and ruminations.   Patricia Boyer participated in the creation of the treatment plan)   Patricia Ovens, LCSW

## 2022-08-10 ENCOUNTER — Ambulatory Visit (INDEPENDENT_AMBULATORY_CARE_PROVIDER_SITE_OTHER): Payer: BC Managed Care – PPO | Admitting: Psychology

## 2022-08-10 DIAGNOSIS — F3289 Other specified depressive episodes: Secondary | ICD-10-CM

## 2022-08-10 NOTE — Progress Notes (Signed)
[facility] Counselor/Therapist Progress Note  Patient ID: Patricia Boyer, MRN: [id number]   Date: 08/10/22  Time Spent: 8:05 am - 9:00 am :55 Minutes  Treatment Type: Individual Therapy.  Reported Symptoms: Depression and Anxiety.   Mental Status Exam: Appearance:  Casual     Behavior: Appropriate  Motor: Normal  Speech/Language:  Clear and Coherent  Affect: Congruent  Mood: anxious and depressed  Thought process: normal  Thought content:   WNL  Sensory/Perceptual disturbances:   WNL  Orientation: oriented to person, place, time/date, and situation  Attention: Good  Concentration: Good  Memory: WNL  Fund of knowledge:  Good  Insight:   Good  Judgment:  Good  Impulse Control: Good   Risk Assessment: Danger to Self:  No Self-injurious Behavior: No Danger to Others: No Duty to Warn:no Physical Aggression / Violence:No  Access to Firearms a concern: No  Gang Involvement:No   Subjective:   Patricia Boyer participated from home, via video and consented to treatment. Therapist participated from home office. We met online due to COVID pandemic. Patricia Boyer noted the events the past two weeks. She noted experiencing anticipatory anxiety due to a large scale meeting that she noted being stressful. She noted anxiety that she would experiencing interpersonal stressors. She noted stressors during the meeting and endorsed initial rumination. She noted this subsiding after some time. She noted awaiting word from a job she has applied for. She noted difficulty setting boundaries with her sister and father, who are a lot alike personality wise, and noted a lack of consistency. She noted various contributing factors including worry that her father would continue to bring up topics with an attitude later or punish her, for boundary settings or pushback, taking it out on her mother, feeling pressure to keep him happy, including him in decision-making due to habit, and wanting him to  feel needed".  We worked on identifying the pros and cons to this during the session and will continue to, going forward. Therapist validated and normalized Patricia Boyer's feelings and experience. Therapist provided supportive therapy  and a follow-up was scheduled for continued treatment  Interventions: CBT & interpersonal  Diagnosis:  Other depression  Treatment Plan:  Client Abilities/Strengths Patricia Boyer is intelligent, highly educated, and motivated for change.   Support System: Family and friends.   Client Treatment Preferences Outpatient Therapy  Client Statement of Needs Patricia Boyer would like to manage her symptoms, process past events, improve her mood, address current stressors   Treatment Level Weekly  Symptoms  Depression:  loss of interest, feeling depressed and hopeless, hypersomnia & middle insomnia, feeling bad about self, over eating, feeling bad about self, trouble concentrating, psychomotor agitation.  (Status: maintained) Anxiety: Feeling anxious, difficulty managing worry, worrying about different things, trouble relaxing, restlessness, irritability, feeling afraid something awful might happen  (Status: maintained)  Goals:   Patricia Boyer experiences symptoms of depression and anxiety.    Target Date: 12/13/22 Frequency: Weekly  Progress: 0 Modality: individual    Therapist will provide referrals for additional resources as appropriate.  Therapist will provide psycho-education regarding Patricia Boyer's diagnosis and corresponding treatment approaches and interventions. Licensed Clinical Social Worker, [place], LCSW will support the patient's ability to achieve the goals identified. will employ CBT, BA, Problem-solving, Solution Focused, Mindfulness,  coping skills, & other evidenced-based practices will be used to promote progress towards healthy functioning to help manage decrease symptoms associated with her diagnosis.   Reduce overall level, frequency, and intensity of the  feelings of depression, anxiety  and panic evidenced by decreased overall symptoms from 6 to 7 days/week to 0 to 1 days/week per client report for at least 3 consecutive months. Verbally express understanding of the relationship between feelings of depression, anxiety and their impact on thinking patterns and behaviors. Verbalize an understanding of the role that distorted thinking plays in creating fears, excessive worry, and ruminations.   [name] participated in the creation of the treatment plan)   Patricia Ovens, LCSW

## 2022-08-20 ENCOUNTER — Other Ambulatory Visit: Payer: Self-pay | Admitting: Nurse Practitioner

## 2022-08-20 DIAGNOSIS — I158 Other secondary hypertension: Secondary | ICD-10-CM

## 2022-08-24 ENCOUNTER — Ambulatory Visit: Payer: BC Managed Care – PPO | Admitting: Psychology

## 2022-08-24 DIAGNOSIS — F3289 Other specified depressive episodes: Secondary | ICD-10-CM | POA: Diagnosis not present

## 2022-08-24 DIAGNOSIS — F418 Other specified anxiety disorders: Secondary | ICD-10-CM | POA: Diagnosis not present

## 2022-08-24 NOTE — Progress Notes (Signed)
[facility] Counselor/Therapist Progress Note  Patient ID: JEWELLE Boyer, MRN: [id number]   Date: 08/24/22  Time Spent: 8:05 am - 8:58 am : 53 Minutes  Treatment Type: Individual Therapy.  Reported Symptoms: Depression and Anxiety.   Mental Status Exam: Appearance:  Casual     Behavior: Appropriate  Motor: Normal  Speech/Language:  Clear and Coherent  Affect: Congruent  Mood: anxious and depressed  Thought process: normal  Thought content:   WNL  Sensory/Perceptual disturbances:   WNL  Orientation: oriented to person, place, time/date, and situation  Attention: Good  Concentration: Good  Memory: WNL  Fund of knowledge:  Good  Insight:   Good  Judgment:  Good  Impulse Control: Good   Risk Assessment: Danger to Self:  No Self-injurious Behavior: No Danger to Others: No Duty to Warn:no Physical Aggression / Violence:No  Access to Firearms a concern: No  Gang Involvement:No   Subjective:   Patricia Boyer participated from home, via video, is aware of the limitations of tele-health and consented to treatment. Therapist participated from home office.  Patricia Boyer noted the events the past two weeks.Patricia Boyer noted a decline in her drive and energy at work. We explored this during the session. She noted not being challenged and not feeling productive, at work, due to her job assignments which do not fit her job role. She noted an upcoming 3 day work retreat and noted her lack of excitement regarding this retreat. She noted feeling helpless in her job as she attempts to advocate for self and noted consistent roadblocks including getting her own office, getting an operable desk, getting an updated computer, and getting job tasks and duties in line with her role and education. We worked on exploring this during the session. Therapist encouraged Patricia Boyer to continue setting her boundaries, manage her distress, and focus on creating a routine, outside of work, that is fulfilling  and meaningful. Patricia Boyer was engaged and motivated and expressed commitment towards goals. Therapist praised Patricia Boyer and provided supportive therapy.   Interventions: CBT & interpersonal  Diagnosis:  Other depression  Other specified anxiety disorders  Treatment Plan:  Client Abilities/Strengths Patricia Boyer is intelligent, highly educated, and motivated for change.   Support System: Family and friends.   Client Treatment Preferences Outpatient Therapy  Client Statement of Needs Patricia Boyer would like to manage her symptoms, process past events, improve her mood, address current stressors   Treatment Level Weekly  Symptoms  Depression:  loss of interest, feeling depressed and hopeless, hypersomnia & middle insomnia, feeling bad about self, over eating, feeling bad about self, trouble concentrating, psychomotor agitation.  (Status: maintained) Anxiety: Feeling anxious, difficulty managing worry, worrying about different things, trouble relaxing, restlessness, irritability, feeling afraid something awful might happen  (Status: maintained)  Goals:   [name] experiences symptoms of depression and anxiety.    Target Date: 12/13/22 Frequency: Weekly  Progress: 0 Modality: individual    Therapist will provide referrals for additional resources as appropriate.  Therapist will provide psycho-education regarding Patricia Boyer's diagnosis and corresponding treatment approaches and interventions. Licensed Clinical Social Worker, [place], LCSW will support the patient's ability to achieve the goals identified. will employ CBT, BA, Problem-solving, Solution Focused, Mindfulness,  coping skills, & other evidenced-based practices will be used to promote progress towards healthy functioning to help manage decrease symptoms associated with her diagnosis.   Reduce overall level, frequency, and intensity of the feelings of depression, anxiety and panic evidenced by decreased overall symptoms from 6 to 7  days/week  to 0 to 1 days/week per client report for at least 3 consecutive months. Verbally express understanding of the relationship between feelings of depression, anxiety and their impact on thinking patterns and behaviors. Verbalize an understanding of the role that distorted thinking plays in creating fears, excessive worry, and ruminations.   Patricia Boyer participated in the creation of the treatment plan)   Patricia Ovens, LCSW

## 2022-09-04 ENCOUNTER — Encounter: Payer: BC Managed Care – PPO | Admitting: Nurse Practitioner

## 2022-09-04 NOTE — Progress Notes (Deleted)
I,Jameka J Llittleton, CMA,acting as a Neurosurgeon for [employer], FNP.,have documented all relevant documentation on the behalf of [name], FNP,as directed by  [name], FNP while in the presence of [name], FNP.  Subjective:    Patient ID: Patricia Boyer , female    DOB: March 12, 1980 , 42 y.o.   MRN: [id number]  No chief complaint on file.   HPI  Patient presents today for HM.      Past Medical History:  Diagnosis Date   Anemia    Arthritis    Cataract    GERD (gastroesophageal reflux disease)      Family History  Problem Relation Age of Onset   Hypertension Mother    Sleep apnea Mother      Current Outpatient Medications:    amLODipine (NORVASC) 10 MG tablet, TAKE 1 TABLET BY MOUTH EVERY DAY, Disp: 90 tablet, Rfl: 1   busPIRone (BUSPAR) 5 MG tablet, Take 1 tablet (5 mg total) by mouth daily., Disp: 90 tablet, Rfl: 1   clobetasol (TEMOVATE) 0.05 % external solution, Apply topically 2 (two) times daily., Disp: , Rfl:    Magnesium Gluconate 250 MG TABS, Take 1 tablet (250 mg total) by mouth every evening., Disp: 90 tablet, Rfl: 1   spironolactone (ALDACTONE) 25 MG tablet, TAKE 1 TABLET (25 MG TOTAL) BY MOUTH DAILY., Disp: 90 tablet, Rfl: 1   Vitamin D, Ergocalciferol, (DRISDOL) 1.25 MG (50000 UNIT) CAPS capsule, Take 1 capsule (50,000 Units total) by mouth every 7 (seven) days., Disp: 12 capsule, Rfl: 3   vortioxetine HBr (TRINTELLIX) 10 MG TABS tablet, Take 1 tablet (10 mg total) by mouth daily., Disp: 90 tablet, Rfl: 1   No Known Allergies    The patient states she uses {contraceptive methods:5051} for birth control. No LMP recorded.. {Dysmenorrhea-menorrhagia:21918}. Negative for: breast discharge, breast lump(s), breast pain and breast self exam. Associated symptoms include abnormal vaginal bleeding. Pertinent negatives include abnormal bleeding (hematology), anxiety, decreased libido, depression, difficulty falling sleep, dyspareunia, history of infertility,  nocturia, sexual dysfunction, sleep disturbances, urinary incontinence, urinary urgency, vaginal discharge and vaginal itching. Diet regular.The patient states her exercise level is    . The patient's tobacco use is:  Social History   Tobacco Use  Smoking Status Never  Smokeless Tobacco Never  . She has been exposed to passive smoke. The patient's alcohol use is:  Social History   Substance and Sexual Activity  Alcohol Use Yes   Alcohol/week: 1.0 standard drink of alcohol   Types: 1 Cans of beer per week  . Additional information: Last pap ***, next one scheduled for ***.    Review of Systems  Constitutional: Negative.   HENT: Negative.    Eyes: Negative.   Respiratory: Negative.    Cardiovascular: Negative.   Gastrointestinal: Negative.   Endocrine: Negative.   Genitourinary: Negative.   Musculoskeletal: Negative.   Skin: Negative.   Allergic/Immunologic: Negative.   Neurological: Negative.   Hematological: Negative.   Psychiatric/Behavioral: Negative.       There were no vitals filed for this visit. There is no height or weight on file to calculate BMI.  Wt Readings from Last 3 Encounters:  06/27/22 295 lb 3.2 oz (133.9 kg)  04/10/22 290 lb (131.5 kg)  03/29/22 290 lb (131.5 kg)     Objective:  Physical Exam      Assessment And Plan:     Annual physical exam  Essential hypertension  Elevated cholesterol  Vitamin D deficiency  No follow-ups on file. Patient was given opportunity to ask questions. Patient verbalized understanding of the plan and was able to repeat key elements of the plan. All questions were answered to their satisfaction.   [name], FNP  I, [name], FNP, have reviewed all documentation for this visit. The documentation on 09/04/22 for the exam, diagnosis, procedures, and orders are all accurate and complete.

## 2022-09-07 ENCOUNTER — Ambulatory Visit (INDEPENDENT_AMBULATORY_CARE_PROVIDER_SITE_OTHER): Payer: BC Managed Care – PPO | Admitting: Psychology

## 2022-09-07 DIAGNOSIS — F418 Other specified anxiety disorders: Secondary | ICD-10-CM

## 2022-09-07 DIAGNOSIS — F3289 Other specified depressive episodes: Secondary | ICD-10-CM

## 2022-09-07 NOTE — Progress Notes (Signed)
[facility] Counselor/Therapist Progress Note  Patient ID: Patricia Boyer, MRN: [id number]   Date: 09/07/22  Time Spent: 10:04 am - 11:00 am : 56 Minutes  Treatment Type: Individual Therapy.  Reported Symptoms: Depression and Anxiety.   Mental Status Exam: Appearance:  Casual     Behavior: Appropriate  Motor: Normal  Speech/Language:  Clear and Coherent  Affect: Congruent  Mood: anxious and depressed  Thought process: normal  Thought content:   WNL  Sensory/Perceptual disturbances:   WNL  Orientation: oriented to person, place, time/date, and situation  Attention: Good  Concentration: Good  Memory: WNL  Fund of knowledge:  Good  Insight:   Good  Judgment:  Good  Impulse Control: Good   Risk Assessment: Danger to Self:  No Self-injurious Behavior: No Danger to Others: No Duty to Warn:no Physical Aggression / Violence:No  Access to Firearms a concern: No  Gang Involvement:No   Subjective:   [name] participated from home, via video, is aware of the limitations of tele-health and consented to treatment. Therapist participated from home office.  Elaf noted the events the past two weeks. Yevonne noted attending the new staff at a three day work trip and noted looking for the positives during a stressful situation. She noted some interpersonal stressors during the trip. She noted her effort to navigate a conversation about this issue and noted being accused of being "angry" when she was attempting to problem-solve an issue. She noted worry that this would develop into additional stress after returning to work from the trip. She noted her effort to identify areas of control and lack of control. We worked on identifying ways to manage conversation regarding the trip should it come up at work. We role-played this during the session. Sophiea expressed disappointment regarding not being considered for two jobs she has applied for and noted two deaths in the  family, during the trip. We worked on processing these experiences. She discussed frustration regarding being put through a long interview process only to be declined with a canned message. We processed this during the session. Therapist encouraged continued boundary setting, self-care, and identified positives. Leaundra was engaged and motivated during the session. We scheduled a follow-up for continued treatment.   Interventions: CBT & interpersonal  Diagnosis:  Other depression  Other specified anxiety disorders  Treatment Plan:  Client Abilities/Strengths Gaylin is intelligent, highly educated, and motivated for change.   Support System: Family and friends.   Client Treatment Preferences Outpatient Therapy  Client Statement of Needs Clora would like to manage her symptoms, process past events, improve her mood, address current stressors   Treatment Level Weekly  Symptoms  Depression:  loss of interest, feeling depressed and hopeless, hypersomnia & middle insomnia, feeling bad about self, over eating, feeling bad about self, trouble concentrating, psychomotor agitation.  (Status: maintained) Anxiety: Feeling anxious, difficulty managing worry, worrying about different things, trouble relaxing, restlessness, irritability, feeling afraid something awful might happen  (Status: maintained)  Goals:   Havannah experiences symptoms of depression and anxiety.    Target Date: 12/13/22 Frequency: Weekly  Progress: 0 Modality: individual    Therapist will provide referrals for additional resources as appropriate.  Therapist will provide psycho-education regarding Carnell's diagnosis and corresponding treatment approaches and interventions. Licensed Clinical Social Worker, [place], LCSW will support the patient's ability to achieve the goals identified. will employ CBT, BA, Problem-solving, Solution Focused, Mindfulness,  coping skills, & other evidenced-based practices will be  used to promote progress  towards healthy functioning to help manage decrease symptoms associated with her diagnosis.   Reduce overall level, frequency, and intensity of the feelings of depression, anxiety and panic evidenced by decreased overall symptoms from 6 to 7 days/week to 0 to 1 days/week per client report for at least 3 consecutive months. Verbally express understanding of the relationship between feelings of depression, anxiety and their impact on thinking patterns and behaviors. Verbalize an understanding of the role that distorted thinking plays in creating fears, excessive worry, and ruminations.   [name] participated in the creation of the treatment plan)   [name], LCSW

## 2022-09-20 ENCOUNTER — Ambulatory Visit: Payer: BC Managed Care – PPO | Admitting: Psychology

## 2022-09-20 DIAGNOSIS — F418 Other specified anxiety disorders: Secondary | ICD-10-CM

## 2022-09-20 DIAGNOSIS — F3289 Other specified depressive episodes: Secondary | ICD-10-CM | POA: Diagnosis not present

## 2022-09-20 NOTE — Progress Notes (Signed)
[facility] Counselor/Therapist Progress Note  Patient ID: Patricia Boyer, MRN: [id number]   Date: 09/20/22  Time Spent: 3:03 pm - 3:59 pm : 56 Minutes  Treatment Type: Individual Therapy.  Reported Symptoms: Depression and Anxiety.   Mental Status Exam: Appearance:  Casual     Behavior: Appropriate  Motor: Normal  Speech/Language:  Clear and Coherent  Affect: Congruent  Mood: anxious and depressed  Thought process: normal  Thought content:   WNL  Sensory/Perceptual disturbances:   WNL  Orientation: oriented to person, place, time/date, and situation  Attention: Good  Concentration: Good  Memory: WNL  Fund of knowledge:  Good  Insight:   Good  Judgment:  Good  Impulse Control: Good   Risk Assessment: Danger to Self:  No Self-injurious Behavior: No Danger to Others: No Duty to Warn:no Physical Aggression / Violence:No  Access to Firearms a concern: No  Gang Involvement:No   Subjective:   [name] participated from home, via video, is aware of the limitations of tele-health and consented to treatment. Therapist participated from home office.  [name] noted the events the past two weeks. She noted her work related stressors and noted continuing to not have the tools needed. She noted her work on [employer]. She noted her efforts to continue exercise. She noted her frustration regarding her job search process specifically being asked for a 1 day interview, after multiple interviews, and not receiving any communication prior to no longer being considered for the position. We worked on identifying ways to navigate her job stressors during the session. We worked on identifying areas of additional support at work and outside of work. She noted focusing on healthful eating and exercise. Therapist praised [name] for her intent to focus on her health. We worked on identifying ways to focus in areas outside of work that can be meaningful and fulfilling while  setting boundaries for self regarding rumination regarding work. [name] was engaged and motivated during the session. She expressed commitment towards our goals. Therapist praised Denhoff for her effort and energy during the session. A follow-up was scheduled for continued treatment.    Interventions: CBT & interpersonal  Diagnosis:  Other depression  Other specified anxiety disorders  Treatment Plan:  Client Abilities/Strengths [name] is intelligent, highly educated, and motivated for change.   Support System: Family and friends.   Client Treatment Preferences Outpatient Therapy  Client Statement of Needs [name] would like to manage her symptoms, process past events, improve her mood, address current stressors   Treatment Level Weekly  Symptoms  Depression:  loss of interest, feeling depressed and hopeless, hypersomnia & middle insomnia, feeling bad about self, over eating, feeling bad about self, trouble concentrating, psychomotor agitation.  (Status: maintained) Anxiety: Feeling anxious, difficulty managing worry, worrying about different things, trouble relaxing, restlessness, irritability, feeling afraid something awful might happen  (Status: maintained)  Goals:   Beverely experiences symptoms of depression and anxiety.    Target Date: 12/13/22 Frequency: Weekly  Progress: 0 Modality: individual    Therapist will provide referrals for additional resources as appropriate.  Therapist will provide psycho-education regarding [name]'s diagnosis and corresponding treatment approaches and interventions. Licensed Clinical Social Worker, [place], LCSW will support the patient's ability to achieve the goals identified. will employ CBT, BA, Problem-solving, Solution Focused, Mindfulness,  coping skills, & other evidenced-based practices will be used to promote progress towards healthy functioning to help manage decrease symptoms associated with her diagnosis.   Reduce overall  level, frequency,  and intensity of the feelings of depression, anxiety and panic evidenced by decreased overall symptoms from 6 to 7 days/week to 0 to 1 days/week per client report for at least 3 consecutive months. Verbally express understanding of the relationship between feelings of depression, anxiety and their impact on thinking patterns and behaviors. Verbalize an understanding of the role that distorted thinking plays in creating fears, excessive worry, and ruminations.   [name] participated in the creation of the treatment plan)  [name], LCSW

## 2022-10-02 ENCOUNTER — Ambulatory Visit (INDEPENDENT_AMBULATORY_CARE_PROVIDER_SITE_OTHER): Payer: BC Managed Care – PPO | Admitting: Psychology

## 2022-10-02 DIAGNOSIS — F3289 Other specified depressive episodes: Secondary | ICD-10-CM

## 2022-10-02 DIAGNOSIS — F418 Other specified anxiety disorders: Secondary | ICD-10-CM | POA: Diagnosis not present

## 2022-10-02 NOTE — Progress Notes (Signed)
[facility] Counselor/Therapist Progress Note  Patient ID: Patricia Boyer, MRN: [id number]   Date: 10/02/22  Time Spent: 4:02 pm - 4:53 pm : 51 Minutes  Treatment Type: Individual Therapy.  Reported Symptoms: Depression and Anxiety.   Mental Status Exam: Appearance:  Casual     Behavior: Appropriate  Motor: Normal  Speech/Language:  Clear and Coherent  Affect: Congruent  Mood: anxious and depressed  Thought process: normal  Thought content:   WNL  Sensory/Perceptual disturbances:   WNL  Orientation: oriented to person, place, time/date, and situation  Attention: Good  Concentration: Good  Memory: WNL  Fund of knowledge:  Good  Insight:   Good  Judgment:  Good  Impulse Control: Good   Risk Assessment: Danger to Self:  No Self-injurious Behavior: No Danger to Others: No Duty to Warn:no Physical Aggression / Violence:No  Access to Firearms a concern: No  Gang Involvement:No   Subjective:   [name] participated from home, via video, is aware of the limitations of tele-health and consented to treatment. Therapist participated from home office.  Timberlyn noted the events the past two weeks. Slyvia noted seeking support from her father regarding her work stressors and noted a lack of support and empathy. She noted frustration regarding the lack of support despite her father experiencing similar work related stressors prior to retirement. We worked on processing her expectations for this conversation. She noted, in hind-sight, going to her father makes "things easier" in lieu of him being antagonistic. She noted that her father taking credit for other people's accomplishment and needing praise and recognition. She noted that her father's support, financial and otherwise, are contingent on [place] doing as her father says, following directions, and including him in her personal life. She noted a history of her father reacting poorly when not included in her  personal decision-making. She noted continued stressors, at work, due to people taking credit for her projects at work. We processed this during the session. We worked on identifying boundaries, reducing communication regarding topics that result in tension, setting and maintaining boundaries consistently, and identifying alternative areas of support. We worked on acceptance and identifying areas of control and lack of control. Yisel was engaged and motivated during the session. She expressed commitment towards goals therapist praised Desha and provided supportive therapy. A follow-up was scheduled for continued treatment.    Interventions: CBT & interpersonal  Diagnosis:  Other depression  Other specified anxiety disorders  Treatment Plan:  Client Abilities/Strengths Yehudis is intelligent, highly educated, and motivated for change.   Support System: Family and friends.   Client Treatment Preferences Outpatient Therapy  Client Statement of Needs Nikiya would like to manage her symptoms, process past events, improve her mood, address current stressors   Treatment Level Weekly  Symptoms  Depression:  loss of interest, feeling depressed and hopeless, hypersomnia & middle insomnia, feeling bad about self, over eating, feeling bad about self, trouble concentrating, psychomotor agitation.  (Status: maintained) Anxiety: Feeling anxious, difficulty managing worry, worrying about different things, trouble relaxing, restlessness, irritability, feeling afraid something awful might happen  (Status: maintained)  Goals:   Roniya experiences symptoms of depression and anxiety.    Target Date: 12/13/22 Frequency: Weekly  Progress: 0 Modality: individual    Therapist will provide referrals for additional resources as appropriate.  Therapist will provide psycho-education regarding Nesta's diagnosis and corresponding treatment approaches and interventions. Licensed Clinical Social Worker,  [place], LCSW will support the patient's ability to achieve the goals  identified. will employ CBT, BA, Problem-solving, Solution Focused, Mindfulness,  coping skills, & other evidenced-based practices will be used to promote progress towards healthy functioning to help manage decrease symptoms associated with her diagnosis.   Reduce overall level, frequency, and intensity of the feelings of depression, anxiety and panic evidenced by decreased overall symptoms from 6 to 7 days/week to 0 to 1 days/week per client report for at least 3 consecutive months. Verbally express understanding of the relationship between feelings of depression, anxiety and their impact on thinking patterns and behaviors. Verbalize an understanding of the role that distorted thinking plays in creating fears, excessive worry, and ruminations.   [name] participated in the creation of the treatment plan)  [name], LCSW

## 2022-10-19 ENCOUNTER — Ambulatory Visit (INDEPENDENT_AMBULATORY_CARE_PROVIDER_SITE_OTHER): Payer: BC Managed Care – PPO | Admitting: Psychology

## 2022-10-19 DIAGNOSIS — F418 Other specified anxiety disorders: Secondary | ICD-10-CM

## 2022-10-19 DIAGNOSIS — F3289 Other specified depressive episodes: Secondary | ICD-10-CM | POA: Diagnosis not present

## 2022-10-19 NOTE — Progress Notes (Signed)
[facility] Counselor/Therapist Progress Note  Patient ID: Patricia Boyer, MRN: [id number]   Date: 10/19/22  Time Spent: 3:06 pm - 3:58 pm : 52 Minutes  Treatment Type: Individual Therapy.  Reported Symptoms: Depression and Anxiety.   Mental Status Exam: Appearance:  Casual     Behavior: Appropriate  Motor: Normal  Speech/Language:  Clear and Coherent  Affect: Congruent  Mood: anxious and depressed  Thought process: normal  Thought content:   WNL  Sensory/Perceptual disturbances:   WNL  Orientation: oriented to person, place, time/date, and situation  Attention: Good  Concentration: Good  Memory: WNL  Fund of knowledge:  Good  Insight:   Good  Judgment:  Good  Impulse Control: Good   Risk Assessment: Danger to Self:  No Self-injurious Behavior: No Danger to Others: No Duty to Warn:no Physical Aggression / Violence:No  Access to Firearms a concern: No  Gang Involvement:No   Subjective:   [name] participated from home, via video, is aware of the limitations of tele-health and consented to treatment. Therapist participated from home office.  Ashtin noted the events the past two weeks. 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 She note receiving a work assignment and noted indirect communication, arbitrary limitations, and unclear expectations. She noted her feelings regarding this and the effect of this on her mood. She noted her efforts to eat healthfully and exercising regularly. She noted working on meal planning and working on building a routine in this area. Therapist praised [place] during the session for focus towards her health goals. She noted having a recent discussion with her sister regarding their father's poor boundaries and efforts to control those around him. She noted disclosing to her sister that her father  kept the proceeds of her home sale. We continued to process her feelings regarding her father's behavior. Therapist validated and normalized Hibah's feelings and experience. Therapist encouraged continued boundary identification and boundaries. We scheduled a follow-up for continued treatment. Therapist provided supportive therapy.    Interventions: CBT & interpersonal  Diagnosis:  Other depression  Other specified anxiety disorders  Treatment Plan:  Client Abilities/Strengths Aston is intelligent, highly educated, and motivated for change.   Support System: Family and friends.   Client Treatment Preferences Outpatient Therapy  Client Statement of Needs Shanera would like to manage her symptoms, process past events, improve her mood, address current stressors   Treatment Level Weekly  Symptoms  Depression:  loss of interest, feeling depressed and hopeless, hypersomnia & middle insomnia, feeling bad about self, over eating, feeling bad about self, trouble concentrating, psychomotor agitation.  (Status: maintained) Anxiety: Feeling anxious, difficulty managing worry, worrying about different things, trouble relaxing, restlessness, irritability, feeling afraid something awful might happen  (Status: maintained)  Goals:   Velinda experiences symptoms of depression and anxiety.    Target Date: 12/13/22 Frequency: Weekly  Progress: 0 Modality: individual    Therapist will provide referrals for additional resources as appropriate.  Therapist will provide psycho-education regarding Clarabelle's diagnosis and corresponding treatment approaches and interventions. Licensed Clinical Social Worker, [place], LCSW will support the patient's ability to achieve the goals identified. will employ CBT, BA, Problem-solving, Solution Focused, Mindfulness,  coping skills, & other evidenced-based practices will be used to promote progress towards healthy functioning to help manage decrease  symptoms associated with her diagnosis.   Reduce overall level, frequency, and intensity of the feelings  of depression, anxiety and panic evidenced by decreased overall symptoms from 6 to 7 days/week to 0 to 1 days/week per client report for at least 3 consecutive months. Verbally express understanding of the relationship between feelings of depression, anxiety and their impact on thinking patterns and behaviors. Verbalize an understanding of the role that distorted thinking plays in creating fears, excessive worry, and ruminations.   [name] participated in the creation of the treatment plan)  [name], LCSW

## 2022-11-02 ENCOUNTER — Ambulatory Visit (INDEPENDENT_AMBULATORY_CARE_PROVIDER_SITE_OTHER): Payer: BC Managed Care – PPO | Admitting: Psychology

## 2022-11-02 DIAGNOSIS — F418 Other specified anxiety disorders: Secondary | ICD-10-CM

## 2022-11-02 DIAGNOSIS — F3289 Other specified depressive episodes: Secondary | ICD-10-CM | POA: Diagnosis not present

## 2022-11-02 NOTE — Progress Notes (Signed)
[facility] Counselor/Therapist Progress Note  Patient ID: Patricia Boyer, MRN: [id number]   Date: 11/02/22  Time Spent: 09:08 am - 10:02 am : 54 Minutes  Treatment Type: Individual Therapy.  Reported Symptoms: Depression and Anxiety.   Mental Status Exam: Appearance:  Casual     Behavior: Appropriate  Motor: Normal  Speech/Language:  Clear and Coherent  Affect: Tearful  Mood: anxious and depressed  Thought process: normal  Thought content:   WNL  Sensory/Perceptual disturbances:   WNL  Orientation: oriented to person, place, time/date, and situation  Attention: Good  Concentration: Good  Memory: WNL  Fund of knowledge:  Good  Insight:   Good  Judgment:  Good  Impulse Control: Good   Risk Assessment: Danger to Self:  No Self-injurious Behavior: No Danger to Others: No Duty to Warn:no Physical Aggression / Violence:No  Access to Firearms a concern: No  Gang Involvement:No   Subjective:   Patricia Boyer participated from home, via video, is aware of the limitations of tele-health and consented to treatment. Therapist participated from home office.  Patricia Boyer noted the events the past two weeks. She noted working on managing work related stressors. She noted setting a boundary at work and reaffirming her stance only to later be accused of not fulfilling the request. She noted providing documentation of the conversation with her supervisor and noted her supervisor ignoring this. Patricia Boyer was tearful during the session and noted feeling a significant lack of support from work, noted the lack of appropriate resources, and not feeling ownership at work. She noted consistent mixed messages from her supervisor and noted feeling targeted. We worked on processing this during the session. We worked on identifying boundaries, ways to be assertive, and advocate for self. Therapist encouraged self-care, focus on positive daily events, and working to not personalize feedback.  Patricia Boyer was engaged during the session. Therapist validated and normalized Patricia Boyer's feelings and experience.  We scheduled a follow-up for continued treatment. Therapist provided supportive therapy.    Interventions: CBT & interpersonal  Diagnosis:  Other specified anxiety disorders  Other depression  Treatment Plan:  Client Abilities/Strengths Patricia Boyer is intelligent, highly educated, and motivated for change.   Support System: Family and friends.   Client Treatment Preferences Outpatient Therapy  Client Statement of Needs Lilias would like to manage her symptoms, process past events, improve her mood, address current stressors   Treatment Level Weekly  Symptoms  Depression:  loss of interest, feeling depressed and hopeless, hypersomnia & middle insomnia, feeling bad about self, over eating, feeling bad about self, trouble concentrating, psychomotor agitation.  (Status: maintained) Anxiety: Feeling anxious, difficulty managing worry, worrying about different things, trouble relaxing, restlessness, irritability, feeling afraid something awful might happen  (Status: maintained)  Goals:   Patricia Boyer experiences symptoms of depression and anxiety.    Target Date: 12/13/22 Frequency: Weekly  Progress: 0 Modality: individual    Therapist will provide referrals for additional resources as appropriate.  Therapist will provide psycho-education regarding Patricia Boyer's diagnosis and corresponding treatment approaches and interventions. Licensed Clinical Social Worker, [place], LCSW will support the patient's ability to achieve the goals identified. will employ CBT, BA, Problem-solving, Solution Focused, Mindfulness,  coping skills, & other evidenced-based practices will be used to promote progress towards healthy functioning to help manage decrease symptoms associated with her diagnosis.   Reduce overall level, frequency, and intensity of the feelings of depression, anxiety and panic  evidenced by decreased overall symptoms from 6 to 7 days/week to 0 to  1 days/week per client report for at least 3 consecutive months. Verbally express understanding of the relationship between feelings of depression, anxiety and their impact on thinking patterns and behaviors. Verbalize an understanding of the role that distorted thinking plays in creating fears, excessive worry, and ruminations.   [name] participated in the creation of the treatment plan)  [name], LCSW

## 2022-11-16 ENCOUNTER — Ambulatory Visit (INDEPENDENT_AMBULATORY_CARE_PROVIDER_SITE_OTHER): Payer: BC Managed Care – PPO | Admitting: Psychology

## 2022-11-16 DIAGNOSIS — F3289 Other specified depressive episodes: Secondary | ICD-10-CM | POA: Diagnosis not present

## 2022-11-16 DIAGNOSIS — F418 Other specified anxiety disorders: Secondary | ICD-10-CM

## 2022-11-16 NOTE — Progress Notes (Signed)
[facility] Counselor/Therapist Progress Note  Patient ID: Patricia Boyer, MRN: [id number]   Date: 11/16/22  Time Spent: 08:07 am - 9:02 am : 55 Minutes  Treatment Type: Individual Therapy.  Reported Symptoms: Depression and Anxiety.   Mental Status Exam: Appearance:  Casual     Behavior: Appropriate  Motor: Normal  Speech/Language:  Clear and Coherent  Affect: Tearful  Mood: anxious and depressed  Thought process: normal  Thought content:   WNL  Sensory/Perceptual disturbances:   WNL  Orientation: oriented to person, place, time/date, and situation  Attention: Good  Concentration: Good  Memory: WNL  Fund of knowledge:  Good  Insight:   Good  Judgment:  Good  Impulse Control: Good   Risk Assessment: Danger to Self:  No Self-injurious Behavior: No Danger to Others: No Duty to Warn:no Physical Aggression / Violence:No  Access to Firearms a concern: No  Gang Involvement:No   Subjective:   Patricia Boyer participated from home, via video, is aware of the limitations of tele-health and consented to treatment. Therapist participated from home office.  Patricia Boyer noted the events the past two weeks. She noted work related stressors regarding unreasonable requests and working on advocating for self only to receive negative feedback to requesting information regarding policy. She continues to feel unsupported by her supervisor and coworkers. We worked on continuing to process this during the session, identify boundaries, be assertive, manage work related frustration. We worked on feeling identification during the session. She recalled a recent conversation where she felt unheard, bullied, and antagonized. We worked on role-playing ways to set boundaries, communicate assertively, and be consistent in the face of stressors at work and antagonism. Mahli was tearful during the session. Therapist validated Patricia Boyer's feelings and experience. We processed her feelings and  worked on identifying boundaries for self and ways to not personalize these interactions. Patricia Boyer was engaged and motivated and expressed commitment towards our goals. Therapist provided supportive therapy and a follow up was scheduled for continued treatment.   Interventions: CBT & interpersonal  Diagnosis:  Other specified anxiety disorders  Other depression  Treatment Plan:  Client Abilities/Strengths Patricia Boyer is intelligent, highly educated, and motivated for change.   Support System: Family and friends.   Client Treatment Preferences Outpatient Therapy  Client Statement of Needs Patricia Boyer would like to manage her symptoms, process past events, improve her mood, address current stressors   Treatment Level Weekly  Symptoms  Depression:  loss of interest, feeling depressed and hopeless, hypersomnia & middle insomnia, feeling bad about self, over eating, feeling bad about self, trouble concentrating, psychomotor agitation.  (Status: maintained)  Anxiety: Feeling anxious, difficulty managing worry, worrying about different things, trouble relaxing, restlessness, irritability, feeling afraid something awful might happen  (Status: maintained)  Goals:   Patricia Boyer experiences symptoms of depression and anxiety.    Target Date: 12/13/22 Frequency: Weekly  Progress: 0 Modality: individual    Therapist will provide referrals for additional resources as appropriate.  Therapist will provide psycho-education regarding Patricia Boyer's diagnosis and corresponding treatment approaches and interventions. Licensed Clinical Social Worker, [place], LCSW will support the patient's ability to achieve the goals identified. will employ CBT, BA, Problem-solving, Solution Focused, Mindfulness,  coping skills, & other evidenced-based practices will be used to promote progress towards healthy functioning to help manage decrease symptoms associated with her diagnosis.   Reduce overall level, frequency, and  intensity of the feelings of depression, anxiety and panic evidenced by decreased overall symptoms from 6 to 7 days/week to  0 to 1 days/week per client report for at least 3 consecutive months. Verbally express understanding of the relationship between feelings of depression, anxiety and their impact on thinking patterns and behaviors. Verbalize an understanding of the role that distorted thinking plays in creating fears, excessive worry, and ruminations.   Patricia Boyer participated in the creation of the treatment plan)  Patricia Ovens, LCSW

## 2022-11-29 ENCOUNTER — Ambulatory Visit: Payer: BC Managed Care – PPO | Admitting: Psychology

## 2022-11-29 DIAGNOSIS — F418 Other specified anxiety disorders: Secondary | ICD-10-CM | POA: Diagnosis not present

## 2022-11-29 DIAGNOSIS — F3289 Other specified depressive episodes: Secondary | ICD-10-CM

## 2022-11-29 NOTE — Progress Notes (Signed)
  [facility] Counselor/Therapist Progress Note  Patient ID: Patricia Boyer, MRN: [id number]   Date: 11/29/22  Time Spent: 1:06 pm - 2:00 pm : 54 Minutes  Treatment Type: Individual Therapy.  Reported Symptoms: Depression and Anxiety.   Mental Status Exam: Appearance:  Casual     Behavior: Appropriate  Motor: Normal  Speech/Language:  Clear and Coherent  Affect: Tearful  Mood: anxious and depressed  Thought process: normal  Thought content:   WNL  Sensory/Perceptual disturbances:   WNL  Orientation: oriented to person, place, time/date, and situation  Attention: Good  Concentration: Good  Memory: WNL  Fund of knowledge:  Good  Insight:   Good  Judgment:  Good  Impulse Control: Good   Risk Assessment: Danger to Self:  No Self-injurious Behavior: No Danger to Others: No Duty to Warn:no Physical Aggression / Violence:No  Access to Firearms a concern: No  Gang Involvement:No   Subjective:   [name] participated from home, via video, is aware of the limitations of tele-health and consented to treatment. Therapist participated from home office.  Shameeka noted the events the past two weeks. She noted working on dealing with her distress, at work, with the lack of supportive environment. She noted a continued lack of resources at work including a desk or a well functioning computer. She was tearful during the session. We worked on exploring her coping, additional ways to manage distress, and ways to advocate for self. Damira was engeagd and motivated during the session. She expressed commitment towards this and engaging in self-care. A follow-up was scheduled for continued treatment.   Interventions: CBT & interpersonal  Diagnosis:  Other specified anxiety disorders  Other depression  Treatment Plan:  Client Abilities/Strengths Chessa is intelligent, highly educated, and motivated for change.   Support System: Family and friends.   Client  Treatment Preferences Outpatient Therapy  Client Statement of Needs Elenor would like to manage her symptoms, process past events, improve her mood, address current stressors   Treatment Level Weekly  Symptoms  Depression:  loss of interest, feeling depressed and hopeless, hypersomnia & middle insomnia, feeling bad about self, over eating, feeling bad about self, trouble concentrating, psychomotor agitation.  (Status: maintained)  Anxiety: Feeling anxious, difficulty managing worry, worrying about different things, trouble relaxing, restlessness, irritability, feeling afraid something awful might happen  (Status: maintained)  Goals:   Rayah experiences symptoms of depression and anxiety.    Target Date: 12/13/22 Frequency: Weekly  Progress: 0 Modality: individual    Therapist will provide referrals for additional resources as appropriate.  Therapist will provide psycho-education regarding Tayvia's diagnosis and corresponding treatment approaches and interventions. Licensed Clinical Social Worker, [place], LCSW will support the patient's ability to achieve the goals identified. will employ CBT, BA, Problem-solving, Solution Focused, Mindfulness,  coping skills, & other evidenced-based practices will be used to promote progress towards healthy functioning to help manage decrease symptoms associated with her diagnosis.   Reduce overall level, frequency, and intensity of the feelings of depression, anxiety and panic evidenced by decreased overall symptoms from 6 to 7 days/week to 0 to 1 days/week per client report for at least 3 consecutive months. Verbally express understanding of the relationship between feelings of depression, anxiety and their impact on thinking patterns and behaviors. Verbalize an understanding of the role that distorted thinking plays in creating fears, excessive worry, and ruminations.   [name] participated in the creation of the treatment plan)  [name], LCSW

## 2022-12-14 ENCOUNTER — Ambulatory Visit (INDEPENDENT_AMBULATORY_CARE_PROVIDER_SITE_OTHER): Payer: BC Managed Care – PPO | Admitting: Psychology

## 2022-12-14 DIAGNOSIS — F418 Other specified anxiety disorders: Secondary | ICD-10-CM | POA: Diagnosis not present

## 2022-12-14 DIAGNOSIS — F3289 Other specified depressive episodes: Secondary | ICD-10-CM | POA: Diagnosis not present
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
## 2022-12-27 ENCOUNTER — Ambulatory Visit: Payer: BC Managed Care – PPO | Admitting: Psychology

## 2022-12-27 DIAGNOSIS — F418 Other specified anxiety disorders: Secondary | ICD-10-CM | POA: Diagnosis not present

## 2022-12-27 DIAGNOSIS — F3289 Other specified depressive episodes: Secondary | ICD-10-CM | POA: Diagnosis not present

## 2022-12-27 NOTE — Progress Notes (Signed)
[facility] Counselor/Therapist Progress Note  Patient ID: Patricia Boyer, MRN: [id number]   Date: 12/27/22  Time Spent: 11:03  am -11:50 pm : 47 Minutes  Treatment Type: Individual Therapy.  Reported Symptoms: Depression and Anxiety.   Mental Status Exam: Appearance:  Casual     Behavior: Appropriate  Motor: Normal  Speech/Language:  Clear and Coherent  Affect: Tearful  Mood: anxious and depressed  Thought process: normal  Thought content:   WNL  Sensory/Perceptual disturbances:   WNL  Orientation: oriented to person, place, time/date, and situation  Attention: Good  Concentration: Good  Memory: WNL  Fund of knowledge:  Good  Insight:   Good  Judgment:  Good  Impulse Control: Good   Risk Assessment: Danger to Self:  No Self-injurious Behavior: No Danger to Others: No Duty to Warn:no Physical Aggression / Violence:No  Access to Firearms a concern: No  Gang Involvement:No   Subjective:   Patricia Boyer participated from home, via video and consented to treatment. Therapist participated from office. We met online due to COVID pandemic. Bronwen reviewed the events of the past week. We reviewed numerous treatment approaches including CBT, BA, Problem Solving, and Solution focused therapy. Psych-education regarding the Jezabel's diagnosis of Other specified anxiety disorders  Other depression was provided during the session. We discussed Patricia Boyer's goals treatment include work on managing day-to-day stressors, boundaries for self and others, managing rumination, work and home-life balance, identifying areas of control and lack of control, managing distress tolerance at work, reducing personalizing at work, assertiveness, focusing on positives, managing symptoms, processing past events, verbalize feelings, engaging in mindfulness, improve self-esteem, process past events, engaging in self-care and maintain consistency. Patricia Boyer provided verbal  approval of the treatment plan.   Interventions: Psycho-education & Goal Setting.   Diagnosis:  Other specified anxiety disorders  Other depression   Treatment Plan:  Client Abilities/Strengths Patricia Boyer is intelligent, well educated, and motivated for change.   Support System: Family and friends.   Client Treatment Preferences Outpatient Therapy  Client Statement of Needs Patricia Boyer would like to work on managing day-to-day stressors, boundaries for self and others, managing rumination, work and home-life balance, identifying areas of control and lack of control, managing distress tolerance at work, reducing personalizing at work, assertiveness, focusing on positives, managing symptoms, processing past events, verbalize feelings, engaging in mindfulness, process past events, engaging in self-care, improve self-esteem, and maintain consistency.   Treatment Level Weekly  Symptoms  Depression:  loss of interest, feeling down, poor sleep, lethargy, fluctuating appetite, feeling bad about self,  and difficulty concentrating.  (Status: maintained) Anxiety: Feeling anxious, difficulty managing worry, worrying about different things, trouble relaxing, irritability.  (Status: maintained)  Goals:   Patricia Boyer experiences symptoms of depression and anxiety.    Target Date: 12/27/23 Frequency: Weekly  Progress: 0 Modality: individual    Therapist will provide referrals for additional resources as appropriate.  Therapist will provide psycho-education regarding Patricia Boyer's diagnosis and corresponding treatment approaches and interventions. Licensed Clinical Social Worker, [place], LCSW will support the patient's ability to achieve the goals identified. will employ CBT, BA, Problem-solving, Solution Focused, Mindfulness,  coping skills, & other evidenced-based practices will be used to promote progress towards healthy functioning to help manage decrease symptoms associated with her diagnosis.    Reduce overall level, frequency, and intensity of the feelings of depression, anxiety and panic evidenced by decreased overall symptoms from 6 to 7 days/week to 0 to 1 days/week per client report  for at least 3 consecutive months. Verbally express understanding of the relationship between feelings of depression, anxiety and their impact on thinking patterns and behaviors. Verbalize an understanding of the role that distorted thinking plays in creating fears, excessive worry, and ruminations.    [name] participated in the creation of the treatment plan)   [name], LCSW

## 2023-01-18 ENCOUNTER — Ambulatory Visit: Payer: BC Managed Care – PPO | Admitting: Psychology

## 2023-01-18 DIAGNOSIS — F418 Other specified anxiety disorders: Secondary | ICD-10-CM | POA: Diagnosis not present

## 2023-01-18 DIAGNOSIS — F3289 Other specified depressive episodes: Secondary | ICD-10-CM | POA: Diagnosis not present

## 2023-01-18 NOTE — Progress Notes (Signed)
[facility] Counselor/Therapist Progress Note  Patient ID: Patricia Boyer, MRN: [id number]   Date: 01/18/23  Time Spent: 8:02 am -8:56 am : 54 Minutes  Treatment Type: Individual Therapy.  Reported Symptoms: Depression and Anxiety.   Mental Status Exam: Appearance:  Casual     Behavior: Appropriate  Motor: Normal  Speech/Language:  Clear and Coherent  Affect: Tearful  Mood: anxious and depressed  Thought process: normal  Thought content:   WNL  Sensory/Perceptual disturbances:   WNL  Orientation: oriented to person, place, time/date, and situation  Attention: Good  Concentration: Good  Memory: WNL  Fund of knowledge:  Good  Insight:   Good  Judgment:  Good  Impulse Control: Good   Risk Assessment: Danger to Self:  No Self-injurious Behavior: No Danger to Others: No Duty to Warn:no Physical Aggression / Violence:No  Access to Firearms a concern: No  Gang Involvement:No   Subjective:   Patricia Boyer participated from home, via video and consented to treatment. Therapist participated from home office. We met online due to COVID pandemic. Patricia Boyer reviewed the events of the past week. Patricia Boyer noted a need to work on her reaction to stressors and gain acceptance regarding the situation. She noted working on disengaging from antagonistic behavior and minimize interactions, when possible. We delineated this during the session. She noted recently being nominated for a training and this being declined by her school. We worked on processing her feelings regarding this during the session. She noted applying to a different job outside of [employer] and received a callback. She noted positive feelings regarding this and noted being hopeful. Therapist introduced DBT framwork to session. Therapist encouraged Patricia Boyer to consider a DBT book, "The Dialectical Behavior Therapy Skills Workbooks". Therapist praised Patricia Boyer for her effort to be more accepting, advocate for self at  work, and reframe stressful situations. Patricia Boyer was engaged during the session. She expressed commitment towards our goals. Therapist praised Patricia Boyer for her effort in the session and provided supportive therapy.     Diagnosis:  Other specified anxiety disorders  Other depression   Treatment Plan:  Client Abilities/Strengths Patricia Boyer is intelligent, well educated, and motivated for change.   Support System: Family and friends.   Client Treatment Preferences Outpatient Therapy  Client Statement of Needs Patricia Boyer would like to work on managing day-to-day stressors, boundaries for self and others, managing rumination, work and home-life balance, identifying areas of control and lack of control, managing distress tolerance at work, reducing personalizing at work, assertiveness, focusing on positives, managing symptoms, processing past events, verbalize feelings, engaging in mindfulness, process past events, engaging in self-care, improve self-esteem, and maintain consistency.   Treatment Level Weekly  Symptoms  Depression:  loss of interest, feeling down, poor sleep, lethargy, fluctuating appetite, feeling bad about self,  and difficulty concentrating.  (Status: maintained) Anxiety: Feeling anxious, difficulty managing worry, worrying about different things, trouble relaxing, irritability.  (Status: maintained)  Goals:   Patricia Boyer experiences symptoms of depression and anxiety.    Target Date: 12/27/23 Frequency: Weekly  Progress: 0 Modality: individual    Therapist will provide referrals for additional resources as appropriate.  Therapist will provide psycho-education regarding Patricia Boyer's diagnosis and corresponding treatment approaches and interventions. Licensed Clinical Social Worker, [place], LCSW will support the patient's ability to achieve the goals identified. will employ CBT, BA, Problem-solving, Solution Focused, Mindfulness,  coping skills, & other evidenced-based  practices will be used to promote progress towards healthy functioning to help manage decrease symptoms associated  with her diagnosis.   Reduce overall level, frequency, and intensity of the feelings of depression, anxiety and panic evidenced by decreased overall symptoms from 6 to 7 days/week to 0 to 1 days/week per client report for at least 3 consecutive months. Verbally express understanding of the relationship between feelings of depression, anxiety and their impact on thinking patterns and behaviors. Verbalize an understanding of the role that distorted thinking plays in creating fears, excessive worry, and ruminations.    Patricia Boyer participated in the creation of the treatment plan)   Patricia Ovens, LCSW

## 2023-02-01 ENCOUNTER — Ambulatory Visit: Payer: BC Managed Care – PPO | Admitting: Psychology

## 2023-02-01 DIAGNOSIS — F3289 Other specified depressive episodes: Secondary | ICD-10-CM | POA: Diagnosis not present

## 2023-02-01 DIAGNOSIS — F418 Other specified anxiety disorders: Secondary | ICD-10-CM | POA: Diagnosis not present

## 2023-02-01 NOTE — Progress Notes (Signed)
[facility] Counselor/Therapist Progress Note  Patient ID: Patricia Boyer, MRN: [id number]   Date: 02/01/23  Time Spent: 8:03 am -9:00 am : 54 Minutes  Treatment Type: Individual Therapy.  Reported Symptoms: Depression and Anxiety.   Mental Status Exam: Appearance:  Casual     Behavior: Appropriate  Motor: Normal  Speech/Language:  Clear and Coherent  Affect: Tearful  Mood: anxious and depressed  Thought process: normal  Thought content:   WNL  Sensory/Perceptual disturbances:   WNL  Orientation: oriented to person, place, time/date, and situation  Attention: Good  Concentration: Good  Memory: WNL  Fund of knowledge:  Good  Insight:   Good  Judgment:  Good  Impulse Control: Good   Risk Assessment: Danger to Self:  No Self-injurious Behavior: No Danger to Others: No Duty to Warn:no Physical Aggression / Violence:No  Access to Firearms a concern: No  Gang Involvement:No   Subjective:   Patricia Boyer participated from home, via video and consented to treatment. Therapist participated from home office. We met online due to COVID pandemic. Patricia Boyer reviewed the events of the past week. She noted having more job interviews and noted being a semi-finalists for a job that she was not accepted. She noted having a different job interview this week, as well. She noted being hopeful for this current opportunity but noted frustration regarding being declined for an interview that required a bachelors degree while she has a PhD. She noted that she isn't "included" in work at work and noted an Biomedical engineer change. She noted some uncertainty regarding these upcoming changes. We worked on exploring this during the session. She noted feeling sick and having to stay home and take care for self. We continued to work on processing her stressors, identifying possible changes to make in regards to dynamics to aid in facilitating change. Patricia Boyer was engaged and motivated during  the session. She expressed commitment towards goals. Therapist praised Patricia Boyer and provided supportive therapy. A follow-up was scheduled for continued treatment.    Diagnosis:  Other specified anxiety disorders  Other depression  Intervention:  CBT Interpersonal  Treatment Plan:  Client Abilities/Strengths Patricia Boyer is intelligent, well educated, and motivated for change.   Support System: Family and friends.   Client Treatment Preferences Outpatient Therapy  Client Statement of Needs Patricia Boyer would like to work on managing day-to-day stressors, boundaries for self and others, managing rumination, work and home-life balance, identifying areas of control and lack of control, managing distress tolerance at work, reducing personalizing at work, assertiveness, focusing on positives, managing symptoms, processing past events, verbalize feelings, engaging in mindfulness, process past events, engaging in self-care, improve self-esteem, and maintain consistency.   Treatment Level Weekly  Symptoms  Depression:  loss of interest, feeling down, poor sleep, lethargy, fluctuating appetite, feeling bad about self,  and difficulty concentrating.  (Status: maintained) Anxiety: Feeling anxious, difficulty managing worry, worrying about different things, trouble relaxing, irritability.  (Status: maintained)  Goals:   Patricia Boyer experiences symptoms of depression and anxiety.    Target Date: 12/27/23 Frequency: Weekly  Progress: 0 Modality: individual    Therapist will provide referrals for additional resources as appropriate.  Therapist will provide psycho-education regarding Patricia Boyer's diagnosis and corresponding treatment approaches and interventions. Licensed Clinical Social Worker, [place], LCSW will support the patient's ability to achieve the goals identified. will employ CBT, BA, Problem-solving, Solution Focused, Mindfulness,  coping skills, & other evidenced-based practices will be  used to promote progress towards healthy functioning to help  manage decrease symptoms associated with her diagnosis.   Reduce overall level, frequency, and intensity of the feelings of depression, anxiety and panic evidenced by decreased overall symptoms from 6 to 7 days/week to 0 to 1 days/week per client report for at least 3 consecutive months. Verbally express understanding of the relationship between feelings of depression, anxiety and their impact on thinking patterns and behaviors. Verbalize an understanding of the role that distorted thinking plays in creating fears, excessive worry, and ruminations.    [name] participated in the creation of the treatment plan)   [name], LCSW

## 2023-02-26 ENCOUNTER — Ambulatory Visit: Payer: 59 | Admitting: Psychology

## 2023-02-26 DIAGNOSIS — F3289 Other specified depressive episodes: Secondary | ICD-10-CM | POA: Diagnosis not present

## 2023-02-26 DIAGNOSIS — F418 Other specified anxiety disorders: Secondary | ICD-10-CM | POA: Diagnosis not present

## 2023-02-26 NOTE — Progress Notes (Signed)
 [facility] Counselor/Therapist Progress Note  Patient ID: Patricia Boyer, MRN: [id number]   Date: 02/26/23  Time Spent: 8:04 am -8:58 am : 54  Minutes  Treatment Type: Individual Therapy.  Reported Symptoms: Depression and Anxiety.   Mental Status Exam: Appearance:  Casual     Behavior: Appropriate  Motor: Normal  Speech/Language:  Clear and Coherent  Affect: Tearful  Mood: anxious and depressed  Thought process: normal  Thought content:   WNL  Sensory/Perceptual disturbances:   WNL  Orientation: oriented to person, place, time/date, and situation  Attention: Good  Concentration: Good  Memory: WNL  Fund of knowledge:  Good  Insight:   Good  Judgment:  Good  Impulse Control: Good   Risk Assessment: Danger to Self:  No Self-injurious Behavior: No Danger to Others: No Duty to Warn:no Physical Aggression / Violence:No  Access to Firearms a concern: No  Gang Involvement:No   Subjective:   Patricia Boyer participated from home, via video and consented to treatment. Therapist participated from office. Patricia Boyer reviewed the events of the past week. She noted procuring The Dialectical Behavior Therapy Skills Workbook.  She noted working on what she would take out of leaving her job and getting a new job. She noted difficulty with maintaining positivity and noted this often becoming stressful. She noted her thoughts often turning negative including Why don't people think I can do that now? People don't believe in me now, and people don't trust in me now. We worked on exploring and processing this during the session. We worked on reframing and rephrasing this during the session. Therapist modeled this during the session. Patricia Boyer worked on this during the session and therapist praised Patricia Boyer for her effort during the session. Therapist encouraged continued work in this area. She noted concerns regarding people accessing her work computer from work and noted  attempting to follow up with that and not hearing much since the initial request for IT. We worked on processing this. She noted not having the necessary furniture and necessary tools for her job at work. We explored this and her attempts to address this in the past which were unsuccessful. We worked on problem solving this during the session. Therapist praised Patricia Boyer for her effort in the session. Therapist validated and normalized Patricia Boyer's feelings and experience. A follow-up was scheduled for continued treatment, which Patricia Boyer benefits from.   Diagnosis:  Other specified anxiety disorders  Other depression  Intervention:  CBT Interpersonal  Treatment Plan:  Client Abilities/Strengths Patricia Boyer is intelligent, well educated, and motivated for change.   Support System: Family and friends.   Client Treatment Preferences Outpatient Therapy  Client Statement of Needs Patricia Boyer would like to work on managing day-to-day stressors, boundaries for self and others, managing rumination, work and home-life balance, identifying areas of control and lack of control, managing distress tolerance at work, reducing personalizing at work, assertiveness, focusing on positives, managing symptoms, processing past events, verbalize feelings, engaging in mindfulness, process past events, engaging in self-care, improve self-esteem, and maintain consistency.   Treatment Level Weekly  Symptoms  Depression:  loss of interest, feeling down, poor sleep, lethargy, fluctuating appetite, feeling bad about self,  and difficulty concentrating.  (Status: maintained) Anxiety: Feeling anxious, difficulty managing worry, worrying about different things, trouble relaxing, irritability.  (Status: maintained)  Goals:   Patricia Boyer experiences symptoms of depression and anxiety.    Target Date: 12/27/23 Frequency: Weekly  Progress: 0 Modality: individual    Therapist will provide referrals for additional  resources as  appropriate.  Therapist will provide psycho-education regarding Patricia Boyer's diagnosis and corresponding treatment approaches and interventions. Licensed Clinical Social Worker, [place], LCSW will support the patient's ability to achieve the goals identified. will employ CBT, BA, Problem-solving, Solution Focused, Mindfulness,  coping skills, & other evidenced-based practices will be used to promote progress towards healthy functioning to help manage decrease symptoms associated with her diagnosis.   Reduce overall level, frequency, and intensity of the feelings of depression, anxiety and panic evidenced by decreased overall symptoms from 6 to 7 days/week to 0 to 1 days/week per client report for at least 3 consecutive months. Verbally express understanding of the relationship between feelings of depression, anxiety and their impact on thinking patterns and behaviors. Verbalize an understanding of the role that distorted thinking plays in creating fears, excessive worry, and ruminations.    [name] participated in the creation of the treatment plan)   [name], LCSW

## 2023-03-15 ENCOUNTER — Ambulatory Visit (INDEPENDENT_AMBULATORY_CARE_PROVIDER_SITE_OTHER): Payer: 59 | Admitting: Psychology

## 2023-03-15 DIAGNOSIS — F418 Other specified anxiety disorders: Secondary | ICD-10-CM

## 2023-03-15 DIAGNOSIS — F3289 Other specified depressive episodes: Secondary | ICD-10-CM | POA: Diagnosis not present

## 2023-03-15 NOTE — Progress Notes (Signed)
[facility] Counselor/Therapist Progress Note  Patient ID: Patricia Boyer, MRN: [id number]   Date: 03/15/23  Time Spent: 8:04 am - 9:00  am : 56  Minutes  Treatment Type: Individual Therapy.  Reported Symptoms: Depression and Anxiety.   Mental Status Exam: Appearance:  Casual     Behavior: Appropriate  Motor: Normal  Speech/Language:  Clear and Coherent  Affect: Tearful  Mood: anxious and depressed  Thought process: normal  Thought content:   WNL  Sensory/Perceptual disturbances:   WNL  Orientation: oriented to person, place, time/date, and situation  Attention: Good  Concentration: Good  Memory: WNL  Fund of knowledge:  Good  Insight:   Good  Judgment:  Good  Impulse Control: Good   Risk Assessment: Danger to Self:  No Self-injurious Behavior: No Danger to Others: No Duty to Warn:no Physical Aggression / Violence:No  Access to Firearms a concern: No  Gang Involvement:No   Subjective:   [name] participated from home, via video and consented to treatment. Therapist participated from office. Kerin reviewed the events of the past week.She noted getting a new supervisor and having difficulty identifying ways to communicate feedback and needs. She noted a lack of notice regarding this change in the org chart. We worked on exploring her anxiety regarding her upcoming meeting and worked on feeling identification. We worked on identifying possible talking points, ways to manage distress during this meeting, assuming positive intent, and build positive rapport. Therapist encouraged challenging negative self-talk and jumping to conclusions and modeled this during the session. We discussed the importance of managing distress and being mindful of expectations. Isabellamarie was engaged and motivated during the session. She expressed commitment towards goals. Therapist praised Watson, encouraged self-care, and provided supportive therapy. A follow-up was scheduled for  continued treatment which Theda benefits from.   Diagnosis:  Other specified anxiety disorders  Other depression  Intervention:  CBT Interpersonal  Treatment Plan:  Client Abilities/Strengths Evely is intelligent, well educated, and motivated for change.   Support System: Family and friends.   Client Treatment Preferences Outpatient Therapy  Client Statement of Needs Clotee would like to work on managing day-to-day stressors, boundaries for self and others, managing rumination, work and home-life balance, identifying areas of control and lack of control, managing distress tolerance at work, reducing personalizing at work, assertiveness, focusing on positives, managing symptoms, processing past events, verbalize feelings, engaging in mindfulness, process past events, engaging in self-care, improve self-esteem, and maintain consistency.   Treatment Level Weekly  Symptoms  Depression:  loss of interest, feeling down, poor sleep, lethargy, fluctuating appetite, feeling bad about self,  and difficulty concentrating.  (Status: maintained) Anxiety: Feeling anxious, difficulty managing worry, worrying about different things, trouble relaxing, irritability.  (Status: maintained)  Goals:   Iza experiences symptoms of depression and anxiety.    Target Date: 12/27/23 Frequency: Weekly  Progress: 0 Modality: individual    Therapist will provide referrals for additional resources as appropriate.  Therapist will provide psycho-education regarding Kealie's diagnosis and corresponding treatment approaches and interventions. Licensed Clinical Social Worker, [place], LCSW will support the patient's ability to achieve the goals identified. will employ CBT, BA, Problem-solving, Solution Focused, Mindfulness,  coping skills, & other evidenced-based practices will be used to promote progress towards healthy functioning to help manage decrease symptoms associated with her diagnosis.    Reduce overall level, frequency, and intensity of the feelings of depression, anxiety and panic evidenced by decreased overall symptoms from 6 to 7 days/week to  0 to 1 days/week per client report for at least 3 consecutive months. Verbally express understanding of the relationship between feelings of depression, anxiety and their impact on thinking patterns and behaviors. Verbalize an understanding of the role that distorted thinking plays in creating fears, excessive worry, and ruminations.    [name] participated in the creation of the treatment plan)   [name], LCSW

## 2023-03-26 ENCOUNTER — Ambulatory Visit: Payer: 59 | Admitting: Psychology

## 2023-03-26 DIAGNOSIS — F418 Other specified anxiety disorders: Secondary | ICD-10-CM | POA: Diagnosis not present

## 2023-03-26 DIAGNOSIS — F3289 Other specified depressive episodes: Secondary | ICD-10-CM | POA: Diagnosis not present

## 2023-03-26 NOTE — Progress Notes (Signed)
[facility] Counselor/Therapist Progress Note  Patient ID: Patricia Boyer, MRN: [id number]   Date: 03/26/23  Time Spent: 8:05 am - 8:56 am : 51  Minutes  Treatment Type: Individual Therapy.  Reported Symptoms: Depression and Anxiety.   Mental Status Exam: Appearance:  Casual     Behavior: Appropriate  Motor: Normal  Speech/Language:  Clear and Coherent  Affect: Tearful  Mood: anxious and depressed  Thought process: normal  Thought content:   WNL  Sensory/Perceptual disturbances:   WNL  Orientation: oriented to person, place, time/date, and situation  Attention: Good  Concentration: Good  Memory: WNL  Fund of knowledge:  Good  Insight:   Good  Judgment:  Good  Impulse Control: Good   Risk Assessment: Danger to Self:  No Self-injurious Behavior: No Danger to Others: No Duty to Warn:no Physical Aggression / Violence:No  Access to Firearms a concern: No  Gang Involvement:No   Subjective:   Patricia Boyer participated from home, via video and consented to treatment. Therapist participated from home office. Patricia Boyer reviewed the events of the past week. She noted work related stressors and noted anxiety regarding an email she received from her supervisor. Therapist highlighted jumps to conclusions during the session regarding the email and meeting. Patricia Boyer noted ruminating regarding this issue. We worked on exploring this during the session. She noted feeling pressure to share only what her supervisor wants her to share. We worked on identifying the evidence for this during the session. We discussed ways to manage this rumination and not jump to conclusions. She noted long-term frustration regarding work due to  not being respected at work. She noted a need for vindication at work. She defined this as being "publicly respected" and being acknowledged. We worked on exploring this. Therapist encouraged Patricia Boyer to set boundaries for self regarding rumination. We discussed  setting a timer, for 5 minutes, and then working on highlighting the root of the issue, writing this down, and working on identifying areas of control and lack of control and using this to drive her direction towards resolution of the issue. We discussed the effect of rumination on mood and anxiety. Patricia Boyer was engaged and motivated during the session and expressed commitment towards goals. Therapist praised Patricia Boyer for her recognition of needing to focus on solutions. Therapist provided supportive therapy and a follow-up was scheduled.   Diagnosis:  Other specified anxiety disorders  Other depression  Intervention:  CBT Interpersonal  Treatment Plan:  Client Abilities/Strengths Patricia Boyer is intelligent, well educated, and motivated for change.   Support System: Family and friends.   Client Treatment Preferences Outpatient Therapy  Client Statement of Needs Patricia Boyer would like to work on managing day-to-day stressors, boundaries for self and others, managing rumination, work and home-life balance, identifying areas of control and lack of control, managing distress tolerance at work, reducing personalizing at work, assertiveness, focusing on positives, managing symptoms, processing past events, verbalize feelings, engaging in mindfulness, process past events, engaging in self-care, improve self-esteem, and maintain consistency.   Treatment Level Weekly  Symptoms  Depression:  loss of interest, feeling down, poor sleep, lethargy, fluctuating appetite, feeling bad about self,  and difficulty concentrating.  (Status: maintained) Anxiety: Feeling anxious, difficulty managing worry, worrying about different things, trouble relaxing, irritability.  (Status: maintained)  Goals:   Patricia Boyer experiences symptoms of depression and anxiety.    Target Date: 12/27/23 Frequency: Weekly  Progress: 0 Modality: individual    Therapist will provide referrals for additional resources as appropriate.  Therapist will provide psycho-education regarding Patricia Boyer's diagnosis and corresponding treatment approaches and interventions. Licensed Clinical Social Worker, [place], LCSW will support the patient's ability to achieve the goals identified. will employ CBT, BA, Problem-solving, Solution Focused, Mindfulness,  coping skills, & other evidenced-based practices will be used to promote progress towards healthy functioning to help manage decrease symptoms associated with her diagnosis.   Reduce overall level, frequency, and intensity of the feelings of depression, anxiety and panic evidenced by decreased overall symptoms from 6 to 7 days/week to 0 to 1 days/week per client report for at least 3 consecutive months. Verbally express understanding of the relationship between feelings of depression, anxiety and their impact on thinking patterns and behaviors. Verbalize an understanding of the role that distorted thinking plays in creating fears, excessive worry, and ruminations.    [name] participated in the creation of the treatment plan)   [name], LCSW

## 2023-04-10 ENCOUNTER — Ambulatory Visit: Payer: 59 | Admitting: Psychology

## 2023-04-10 DIAGNOSIS — F418 Other specified anxiety disorders: Secondary | ICD-10-CM | POA: Diagnosis not present

## 2023-04-10 DIAGNOSIS — F3289 Other specified depressive episodes: Secondary | ICD-10-CM

## 2023-04-10 NOTE — Progress Notes (Signed)
 [facility] Counselor/Therapist Progress Note  Patient ID: Patricia Boyer, MRN: [id number]   Date: 04/10/23  Time Spent: 8:05 am - 8:57 am : 52  Minutes  Treatment Type: Individual Therapy.  Reported Symptoms: Depression and Anxiety.   Mental Status Exam: Appearance:  Casual     Behavior: Appropriate  Motor: Normal  Speech/Language:  Clear and Coherent  Affect: Tearful  Mood: anxious  Thought process: normal  Thought content:   WNL  Sensory/Perceptual disturbances:   WNL  Orientation: oriented to person, place, time/date, and situation  Attention: Good  Concentration: Good  Memory: WNL  Fund of knowledge:  Good  Insight:   Good  Judgment:  Good  Impulse Control: Good   Risk Assessment: Danger to Self:  No Self-injurious Behavior: No Danger to Others: No Duty to Warn:no Physical Aggression / Violence:No  Access to Firearms a concern: No  Gang Involvement:No   Subjective:   Patricia Boyer participated from home, via video and consented to treatment. Therapist participated from home office. Patricia Boyer reviewed the events of the past week. 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 We worked on processing this during the session and she noted previous meetings that have not gone well, in the past, with other supervisors. She noted worry that this meeting will have a negative effect on her but could not identify a specific area. We worked on identifying ways to manage her anxiety prior to the meeting via relaxation, managing distress, & asking to follow-up on issues at a later date. We discussed the importance of positive self-talk regarding meeting. 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 She noted having a "full day interview" and noted stress related to this. We worked on processing this during the session. We reviewed coping skills,  distress tolerance, and positive self-talk. Patricia Boyer was engaged and motivated during the session. Therapist validated Patricia Boyer's feelings and experience during the session. Therapist encouraged self-care. A follow-up was scheduled for continued treatment.   Diagnosis:  Other specified anxiety disorders  Other depression  Intervention:  CBT Interpersonal  Treatment Plan:  Client Abilities/Strengths Patricia Boyer is intelligent, well educated, and motivated for change.   Support System: Family and friends.   Client Treatment Preferences Outpatient Therapy  Client Statement of Needs Patricia Boyer would like to work on managing day-to-day stressors, boundaries for self and others, managing rumination, work and home-life balance, identifying areas of control and lack of control, managing distress tolerance at work, reducing personalizing at work, assertiveness, focusing on positives, managing symptoms, processing past events, verbalize feelings, engaging in mindfulness, process past events, engaging in self-care, improve self-esteem, and maintain consistency.   Treatment Level Weekly  Symptoms  Depression:  loss of interest, feeling down, poor sleep, lethargy, fluctuating appetite, feeling bad about self,  and difficulty concentrating.  (Status: maintained) Anxiety: Feeling anxious, difficulty managing worry, worrying about different things, trouble relaxing, irritability.  (Status: maintained)  Goals:   Patricia Boyer experiences symptoms of depression and anxiety.    Target Date: 12/27/23 Frequency: Weekly  Progress: 0 Modality: individual    Therapist will provide referrals for additional resources as appropriate.  Therapist will provide psycho-education regarding Patricia Boyer's diagnosis and corresponding treatment approaches and interventions. Licensed Clinical Social Worker, [place] [facility], LCSW will support the patient's ability to achieve the goals identified. will employ CBT, BA, Problem-solving,  Solution Focused, Mindfulness,  coping skills, & other evidenced-based practices will be used to  promote progress towards healthy functioning to help manage decrease symptoms associated with her diagnosis.   Reduce overall level, frequency, and intensity of the feelings of depression, anxiety and panic evidenced by decreased overall symptoms from 6 to 7 days/week to 0 to 1 days/week per client report for at least 3 consecutive months. Verbally express understanding of the relationship between feelings of depression, anxiety and their impact on thinking patterns and behaviors. Verbalize an understanding of the role that distorted thinking plays in creating fears, excessive worry, and ruminations.    [name] participated in the creation of the treatment plan)   Patricia Ovens, LCSW

## 2023-04-22 ENCOUNTER — Ambulatory Visit (INDEPENDENT_AMBULATORY_CARE_PROVIDER_SITE_OTHER): Payer: 59 | Admitting: Psychology

## 2023-04-22 DIAGNOSIS — F418 Other specified anxiety disorders: Secondary | ICD-10-CM

## 2023-04-22 DIAGNOSIS — F3289 Other specified depressive episodes: Secondary | ICD-10-CM

## 2023-04-22 NOTE — Progress Notes (Signed)
 [facility] Counselor/Therapist Progress Note  Patient ID: Patricia Boyer, MRN: [id number]   Date: 04/22/23  Time Spent: 8:05 am -  8:56 am : 51  Minutes  Treatment Type: Individual Therapy.  Reported Symptoms: Depression and Anxiety.   Mental Status Exam: Appearance:  Casual     Behavior: Appropriate  Motor: Normal  Speech/Language:  Clear and Coherent  Affect: Congruent  Mood: anxious  Thought process: normal  Thought content:   WNL  Sensory/Perceptual disturbances:   WNL  Orientation: oriented to person, place, time/date, and situation  Attention: Good  Concentration: Good  Memory: WNL  Fund of knowledge:  Good  Insight:   Good  Judgment:  Good  Impulse Control: Good   Risk Assessment: Danger to Self:  No Self-injurious Behavior: No Danger to Others: No Duty to Warn:no Physical Aggression / Violence:No  Access to Firearms a concern: No  Gang Involvement:No   Subjective:   [name] participated from home, via video and consented to treatment. Therapist participated from home office. Shantese reviewed the events of the past week. She noted meeting her new supervisor this past week and recalled the conversation. She described the conversation as "light". We discussed how this experience was not what she anticipated and that her anxiety prior to this meeting was not reflective of her experience during the meeting.  She noted her struggles with lack support regarding the necessary tools including office. She noted the upcoming an upcoming move to a different office building and how this will "work" due to the arrangement. She noted feeling "very uncomfortable" with the idea of working on cubicles. We worked on exploring this during the session. She noted discomfort of people "sitting behind me". She noted feeling "physically tight". She noted this can apply in other settings but to a lesser extent. Therapist encouraged Nattie to explore this between sessions  to be processed going forward including possible contributing factors. Therapist encouraged Ketina to identify any physiological symptoms that could be occurring during these times. Therapist provided [place] a handout, via email, to highlight any symptoms she experiences. Therapist praised [place] for her effort during the session. Malynn was engaged and motivated during the session. She expressed commitment towards our goals. Therapist praised [place] and provided supportive therapy. A follow-up was scheduled for continued treatment.  Diagnosis:  Other specified anxiety disorders  Other depression  Intervention:  CBT Interpersonal  Treatment Plan:  Client Abilities/Strengths Analeia is intelligent, well educated, and motivated for change.   Support System: Family and friends.   Client Treatment Preferences Outpatient Therapy  Client Statement of Needs Delsy would like to work on managing day-to-day stressors, boundaries for self and others, managing rumination, work and home-life balance, identifying areas of control and lack of control, managing distress tolerance at work, reducing personalizing at work, assertiveness, focusing on positives, managing symptoms, processing past events, verbalize feelings, engaging in mindfulness, process past events, engaging in self-care, improve self-esteem, and maintain consistency.   Treatment Level Weekly  Symptoms  Depression:  loss of interest, feeling down, poor sleep, lethargy, fluctuating appetite, feeling bad about self,  and difficulty concentrating.  (Status: maintained) Anxiety: Feeling anxious, difficulty managing worry, worrying about different things, trouble relaxing, irritability.  (Status: maintained)  Goals:   Nkenge experiences symptoms of depression and anxiety.    Target Date: 12/27/23 Frequency: Weekly  Progress: 0 Modality: individual    Therapist will provide referrals for additional resources as appropriate.   Therapist will provide psycho-education regarding Meris's diagnosis  and corresponding treatment approaches and interventions. Licensed Clinical Social Worker, [place], LCSW will support the patient's ability to achieve the goals identified. will employ CBT, BA, Problem-solving, Solution Focused, Mindfulness,  coping skills, & other evidenced-based practices will be used to promote progress towards healthy functioning to help manage decrease symptoms associated with her diagnosis.   Reduce overall level, frequency, and intensity of the feelings of depression, anxiety and panic evidenced by decreased overall symptoms from 6 to 7 days/week to 0 to 1 days/week per client report for at least 3 consecutive months. Verbally express understanding of the relationship between feelings of depression, anxiety and their impact on thinking patterns and behaviors. Verbalize an understanding of the role that distorted thinking plays in creating fears, excessive worry, and ruminations.    [name] participated in the creation of the treatment plan)   [name], LCSW

## 2023-05-06 ENCOUNTER — Ambulatory Visit: Payer: Self-pay | Admitting: Nurse Practitioner

## 2023-05-06 ENCOUNTER — Ambulatory Visit: Admitting: Nurse Practitioner

## 2023-05-06 VITALS — BP 160/110 | HR 70 | Temp 98.1°F | Ht 68.0 in | Wt 288.0 lb

## 2023-05-06 DIAGNOSIS — Z6841 Body Mass Index (BMI) 40.0 and over, adult: Secondary | ICD-10-CM

## 2023-05-06 DIAGNOSIS — I10 Essential (primary) hypertension: Secondary | ICD-10-CM

## 2023-05-06 DIAGNOSIS — E66813 Obesity, class 3: Secondary | ICD-10-CM | POA: Diagnosis not present

## 2023-05-06 DIAGNOSIS — L659 Nonscarring hair loss, unspecified: Secondary | ICD-10-CM | POA: Diagnosis not present

## 2023-05-06 MED ORDER — AMLODIPINE BESYLATE 5 MG PO TABS: 5.0000 mg | ORAL_TABLET | Freq: Every day | ORAL | 1 refills | Status: DC

## 2023-05-06 NOTE — Patient Instructions (Signed)
 Hypertension, Adult Hypertension is another name for high blood pressure. High blood pressure forces your heart to work harder to pump blood. This can cause problems over time. There are two numbers in a blood pressure reading. There is a top number (systolic) over a bottom number (diastolic). It is best to have a blood pressure that is below 120/80. What are the causes? The cause of this condition is not known. Some other conditions can lead to high blood pressure. What increases the risk? Some lifestyle factors can make you more likely to develop high blood pressure: Smoking. Not getting enough exercise or physical activity. Being overweight. Having too much fat, sugar, calories, or salt (sodium) in your diet. Drinking too much alcohol. Other risk factors include: Having any of these conditions: Heart disease. Diabetes. High cholesterol. Kidney disease. Obstructive sleep apnea. Having a family history of high blood pressure and high cholesterol. Age. The risk increases with age. Stress. What are the signs or symptoms? High blood pressure may not cause symptoms. Very high blood pressure (hypertensive crisis) may cause: Headache. Fast or uneven heartbeats (palpitations). Shortness of breath. Nosebleed. Vomiting or feeling like you may vomit (nauseous). Changes in how you see. Very bad chest pain. Feeling dizzy. Seizures. How is this treated? This condition is treated by making healthy lifestyle changes, such as: Eating healthy foods. Exercising more. Drinking less alcohol. Your doctor may prescribe medicine if lifestyle changes do not help enough and if: Your top number is above 130. Your bottom number is above 80. Your personal target blood pressure may vary. Follow these instructions at home: Eating and drinking  If told, follow the DASH eating plan. To follow this plan: Fill one half of your plate at each meal with fruits and vegetables. Fill one fourth of your plate  at each meal with whole grains. Whole grains include whole-wheat pasta, brown rice, and whole-grain bread. Eat or drink low-fat dairy products, such as skim milk or low-fat yogurt. Fill one fourth of your plate at each meal with low-fat (lean) proteins. Low-fat proteins include fish, chicken without skin, eggs, beans, and tofu. Avoid fatty meat, cured and processed meat, or chicken with skin. Avoid pre-made or processed food. Limit the amount of salt in your diet to less than 1,500 mg each day. Do not drink alcohol if: Your doctor tells you not to drink. You are pregnant, may be pregnant, or are planning to become pregnant. If you drink alcohol: Limit how much you have to: 0-1 drink a day for women. 0-2 drinks a day for men. Know how much alcohol is in your drink. In the [place] one drink equals one 12 oz bottle of beer (355 mL), one 5 oz glass of wine (148 mL), or one 1 oz glass of hard liquor (44 mL). Lifestyle  Work with your doctor to stay at a healthy weight or to lose weight. Ask your doctor what the best weight is for you. Get at least 30 minutes of exercise that causes your heart to beat faster (aerobic exercise) most days of the week. This may include walking, swimming, or biking. Get at least 30 minutes of exercise that strengthens your muscles (resistance exercise) at least 3 days a week. This may include lifting weights or doing Pilates. Do not smoke or use any products that contain nicotine or tobacco. If you need help quitting, ask your doctor. Check your blood pressure at home as told by your doctor. Keep all follow-up visits. Medicines Take over-the-counter and prescription medicines  only as told by your doctor. Follow directions carefully. Do not skip doses of blood pressure medicine. The medicine does not work as well if you skip doses. Skipping doses also puts you at risk for problems. Ask your doctor about side effects or reactions to medicines that you should watch  for. Contact a doctor if: You think you are having a reaction to the medicine you are taking. You have headaches that keep coming back. You feel dizzy. You have swelling in your ankles. You have trouble with your vision. Get help right away if: You get a very bad headache. You start to feel mixed up (confused). You feel weak or numb. You feel faint. You have very bad pain in your: Chest. Belly (abdomen). You vomit more than once. You have trouble breathing. These symptoms may be an emergency. Get help right away. Call 911. Do not wait to see if the symptoms will go away. Do not drive yourself to the hospital. Summary Hypertension is another name for high blood pressure. High blood pressure forces your heart to work harder to pump blood. For most people, a normal blood pressure is less than 120/80. Making healthy choices can help lower blood pressure. If your blood pressure does not get lower with healthy choices, you may need to take medicine. This information is not intended to replace advice given to you by your health care provider. Make sure you discuss any questions you have with your health care provider. Document Revised: 11/17/2020 Document Reviewed: 11/17/2020 Elsevier Patient Education  2024 [employer].

## 2023-05-06 NOTE — Telephone Encounter (Signed)
 Copied from CRM [id number]. Topic: Clinical - Red Word Triage >> May 06, 2023 12:22 PM Patricia Boyer wrote: Red Word that prompted transfer to Nurse Triage: pt had tooth pulled and her bp was w/in stroke range.  Pt was given 3 doses of "something" to get it down so they could pull tooth,. Pt has continued headache, which will not go away.   Chief Complaint: severe headache Symptoms: constant  Frequency: 2 weeks ago  Pertinent Negatives: Patient denies weakness, numbness of face arm or legs, difficulty speaking Disposition: [] ED /[] Urgent Care (no appt availability in office) / [x] Appointment(In office/virtual)/ []  [facility]/ [] Home Care/ [] Refused Recommended Disposition /[] [facility]/ []  Follow-up with PCP Additional Notes: took appt today with PCP- advised pt to go to ED if worsens  Reason for Disposition  [1] SEVERE headache (e.g., excruciating) AND [2] not improved after 2 hours of pain medicine  Answer Assessment - Initial Assessment Questions 1. LOCATION: "Where does it hurt?"      Right side to back of head, then front left head  2. ONSET: "When did the headache start?" (Minutes, hours or days)      2 weeks ago  3. PATTERN: "Does the pain come and go, or has it been constant since it started?"     constant 4. SEVERITY: "How bad is the pain?" and "What does it keep you from doing?"  (e.g., Scale 1-10; mild, moderate, or severe)   - MILD (1-3): doesn't interfere with normal activities    - MODERATE (4-7): interferes with normal activities or awakens from sleep    - SEVERE (8-10): excruciating pain, unable to do any normal activities        severe 5. RECURRENT SYMPTOM: "Have you ever had headaches before?" If Yes, ask: "When was the last time?" and "What happened that time?"      Yes this time not going away  6. CAUSE: "What do you think is causing the headache?"     HTN 7. MIGRAINE: "Have you been diagnosed with migraine headaches?" If Yes, ask: "Is this  headache similar?"      no 8. HEAD INJURY: "Has there been any recent injury to the head?"      no 9. OTHER SYMPTOMS: "Do you have any other symptoms?" (fever, stiff neck, eye pain, sore throat, cold symptoms)     no 10. PREGNANCY: "Is there any chance you are pregnant?" "When was your last menstrual period?"       N/a  Protocols used: Headache-A-AH

## 2023-05-06 NOTE — Progress Notes (Signed)
 I,Jameka J Llittleton, CMA,acting as a Neurosurgeon for [employer], FNP.,have documented all relevant documentation on the behalf of [name], FNP,as directed by  [name], FNP while in the presence of [name], FNP.  Subjective:  Patient ID: Patricia Boyer , female    DOB: November 19, 1980 , 43 y.o.   MRN: [id number]  No chief complaint on file.   HPI  Patient presents today for a bpc. She reports her bp was 160/120 on Thursday while at the dentist. She reports her blood pressure was on high on Friday as well at the dentist and they gave her meds to lower her bp. Patient complains of having headaches. She has not been taking any medications for quite some time. She has been exercising 3 days a week. She is seeing a Engineer, production. She has also not been sleeping throughout the night. She continues to go to counseling  LMP - 04/29/2023.     Past Medical History:  Diagnosis Date   Anemia    Arthritis    Cataract    GERD (gastroesophageal reflux disease)      Family History  Problem Relation Age of Onset   Hypertension Mother    Sleep apnea Mother      Current Outpatient Medications:    busPIRone (BUSPAR) 5 MG tablet, Take 1 tablet (5 mg total) by mouth daily., Disp: 90 tablet, Rfl: 1   vortioxetine HBr (TRINTELLIX) 10 MG TABS tablet, Take 1 tablet (10 mg total) by mouth daily., Disp: 90 tablet, Rfl: 1   amLODipine (NORVASC) 5 MG tablet, Take 1 tablet (5 mg total) by mouth daily., Disp: 90 tablet, Rfl: 1   No Known Allergies   Review of Systems  Constitutional: Negative.   Respiratory: Negative.    Cardiovascular: Negative.   Neurological:  Positive for headaches.  Psychiatric/Behavioral: Negative.       Today's Vitals   05/06/23 1610  BP: (!) 160/110  Pulse: 70  Temp: 98.1 F (36.7 C)  TempSrc: Oral  Weight: 288 lb (130.6 kg)  Height: 5\' 8"  (1.727 m)  PainSc: 8   PainLoc: Head   Body mass index is 43.79 kg/m.  Wt Readings from Last 3 Encounters:  05/06/23  288 lb (130.6 kg)  06/27/22 295 lb 3.2 oz (133.9 kg)  04/10/22 290 lb (131.5 kg)     Objective:  Physical Exam Vitals and nursing note reviewed.  Constitutional:      General: She is not in acute distress.    Appearance: Normal appearance. She is obese.  Cardiovascular:     Rate and Rhythm: Normal rate and regular rhythm.     Pulses: Normal pulses.     Heart sounds: Normal heart sounds. No murmur heard. Pulmonary:     Effort: Pulmonary effort is normal. No respiratory distress.     Breath sounds: Normal breath sounds. No wheezing.  Skin:    General: Skin is warm and dry.     Capillary Refill: Capillary refill takes less than 2 seconds.  Neurological:     General: No focal deficit present.     Mental Status: She is alert and oriented to person, place, and time.     Cranial Nerves: No cranial nerve deficit.     Motor: No weakness.  Psychiatric:        Mood and Affect: Mood is not anxious. Affect is flat. Affect is not tearful.        Behavior: Behavior normal.  Thought Content: Thought content normal.        Judgment: Judgment normal.         Assessment And Plan:  Uncontrolled hypertension Assessment & Plan: Blood pressure are elevated, she has not been on her blood pressure medications EKG done shows few ectopic ventricular beats, SR with HR 70  Orders: -     EKG 12-Lead -     CBC -     CMP14+EGFR -     Microalbumin / creatinine urine ratio -     amLODIPine Besylate; Take 1 tablet (5 mg total) by mouth daily.  Dispense: 90 tablet; Refill: 1 -     TSH  Hair loss Assessment & Plan: She seen the dermatologist in [place] however she has relocated and the patient would like to see another dermatologist. She has had some hair growth since the last visit  Orders: -     Ambulatory referral to Dermatology -     VITAMIN D 25 Hydroxy (Vit-D Deficiency, Fractures)  Class 3 severe obesity due to excess calories without serious comorbidity with body mass index (BMI) of  40.0 to 44.9 in adult [facility]) Assessment & Plan: She has lost approximately 14 lbs since her last visit.  She is encouraged to strive for BMI less than 30 to decrease cardiac risk. Advised to aim for at least 150 minutes of exercise per week.      Return for uncontrolled HTN and HM 3-4 months; 2-3 week NV BP check.  Patient was given opportunity to ask questions. Patient verbalized understanding of the plan and was able to repeat key elements of the plan. All questions were answered to their satisfaction.    Jeanell Sparrow, FNP, have reviewed all documentation for this visit. The documentation on 05/06/23 for the exam, diagnosis, procedures, and orders are all accurate and complete.   IF YOU HAVE BEEN REFERRED TO A SPECIALIST, IT MAY TAKE 1-2 WEEKS TO SCHEDULE/PROCESS THE REFERRAL. IF YOU HAVE NOT HEARD FROM US/SPECIALIST IN TWO WEEKS, PLEASE GIVE [place] A CALL AT [phone].

## 2023-05-07 ENCOUNTER — Ambulatory Visit: Payer: 59 | Admitting: Psychology

## 2023-05-08 ENCOUNTER — Ambulatory Visit (INDEPENDENT_AMBULATORY_CARE_PROVIDER_SITE_OTHER): Admitting: Psychology

## 2023-05-08 DIAGNOSIS — F3289 Other specified depressive episodes: Secondary | ICD-10-CM | POA: Diagnosis not present

## 2023-05-08 DIAGNOSIS — F418 Other specified anxiety disorders: Secondary | ICD-10-CM | POA: Diagnosis not present

## 2023-05-08 NOTE — Progress Notes (Signed)
 [facility] Counselor/Therapist Progress Note  Patient ID: Patricia Boyer, MRN: [id number]   Date: 05/08/23  Time Spent: 9:05 am -  10:02 am : 57 Minutes  Treatment Type: Individual Therapy.  Reported Symptoms: Depression and Anxiety.   Mental Status Exam: Appearance:  Casual     Behavior: Appropriate  Motor: Normal  Speech/Language:  Clear and Coherent  Affect: Congruent  Mood: anxious  Thought process: normal  Thought content:   WNL  Sensory/Perceptual disturbances:   WNL  Orientation: oriented to person, place, time/date, and situation  Attention: Good  Concentration: Good  Memory: WNL  Fund of knowledge:  Good  Insight:   Good  Judgment:  Good  Impulse Control: Good   Risk Assessment: Danger to Self:  No Self-injurious Behavior: No Danger to Others: No Duty to Warn:no Physical Aggression / Violence:No  Access to Firearms a concern: No  Gang Involvement:No   Subjective:   Patricia Boyer participated from home, via video and consented to treatment. Therapist participated from home office. Patricia Boyer reviewed the events of the past week. She noted a recent interview for a job and noted "feeling pretty good about it" but being uncertain. She noted upcoming work related stressors in relation to an upcoming office move. She noted the uncertainty being stressful. We worked on exploring this during the session. She noted a recent dental appointment and discovering that she was hypertensive. She noted being prescribed an anti-hypertensive. She noted being unaware of why her BP was high. She noted exercising and "changing" her eating. She could not identify specific stressors that could have affected her BP. She endorsed poor sleep but could not identify any contributing factors for this. We reviewed sleep hygiene during the session. Therapist encouraged continued effort in this area and engage in relaxation prior to bedtime. She noted recognizing a recent aversion to  leaving the home and noted attempting to leave the house more but often feeling "over it". She noted this possibly being influenced by her experience at her current job. She noted often wanting to be left alone at work. Therapist encouraged Patricia Boyer to work on journaling regarding this and work on identifying possible contributing factors to this. She noted an upcoming trip to visit her boyfriends family and noted anxiety regarding this due to past experience. We explored this and identified ways to no engage with antagonistic personalities, be assertive, and disengage. Therapist encouraged Patricia Boyer to communicate needs to significant others prior to the trip. Patricia Boyer was engaged and motivated during these session. She expressed commitment toward goal. A follow-up was scheduled for continued treatment, which she benefits from.    Diagnosis:  Other specified anxiety disorders  Other depression  Intervention:  CBT Interpersonal  Treatment Plan:  Client Abilities/Strengths Charlisa is intelligent, well educated, and motivated for change.   Support System: Family and friends.   Client Treatment Preferences Outpatient Therapy  Client Statement of Needs Breelle would like to work on managing day-to-day stressors, boundaries for self and others, managing rumination, work and home-life balance, identifying areas of control and lack of control, managing distress tolerance at work, reducing personalizing at work, assertiveness, focusing on positives, managing symptoms, processing past events, verbalize feelings, engaging in mindfulness, process past events, engaging in self-care, improve self-esteem, and maintain consistency.   Treatment Level Weekly  Symptoms  Depression:  loss of interest, feeling down, poor sleep, lethargy, fluctuating appetite, feeling bad about self,  and difficulty concentrating.  (Status: maintained) Anxiety: Feeling anxious, difficulty managing worry, worrying  about different  things, trouble relaxing, irritability.  (Status: maintained)  Goals:   Carlis experiences symptoms of depression and anxiety.    Target Date: 12/27/23 Frequency: Weekly  Progress: 0 Modality: individual    Therapist will provide referrals for additional resources as appropriate.  Therapist will provide psycho-education regarding Lashena's diagnosis and corresponding treatment approaches and interventions. Licensed Clinical Social Worker, [place], LCSW will support the patient's ability to achieve the goals identified. will employ CBT, BA, Problem-solving, Solution Focused, Mindfulness,  coping skills, & other evidenced-based practices will be used to promote progress towards healthy functioning to help manage decrease symptoms associated with her diagnosis.   Reduce overall level, frequency, and intensity of the feelings of depression, anxiety and panic evidenced by decreased overall symptoms from 6 to 7 days/week to 0 to 1 days/week per client report for at least 3 consecutive months. Verbally express understanding of the relationship between feelings of depression, anxiety and their impact on thinking patterns and behaviors. Verbalize an understanding of the role that distorted thinking plays in creating fears, excessive worry, and ruminations.    [name] participated in the creation of the treatment plan)   [name], LCSW

## 2023-05-14 ENCOUNTER — Encounter: Payer: Self-pay | Admitting: Nurse Practitioner

## 2023-05-14 DIAGNOSIS — I10 Essential (primary) hypertension: Secondary | ICD-10-CM | POA: Insufficient documentation

## 2023-05-14 NOTE — Assessment & Plan Note (Signed)
 She seen the dermatologist in [facility] however she has relocated and the patient would like to see another dermatologist. She has had some hair growth since the last visit

## 2023-05-14 NOTE — Assessment & Plan Note (Signed)
 Blood pressure are elevated, she has not been on her blood pressure medications EKG done shows few ectopic ventricular beats, SR with HR 70

## 2023-05-14 NOTE — Assessment & Plan Note (Signed)
 She has lost approximately 14 lbs since her last visit.  She is encouraged to strive for BMI less than 30 to decrease cardiac risk. Advised to aim for at least 150 minutes of exercise per week.

## 2023-05-28 ENCOUNTER — Ambulatory Visit

## 2023-05-28 ENCOUNTER — Ambulatory Visit (INDEPENDENT_AMBULATORY_CARE_PROVIDER_SITE_OTHER): Admitting: Psychology

## 2023-05-28 ENCOUNTER — Ambulatory Visit: Admitting: Psychology

## 2023-05-28 VITALS — BP 120/82 | HR 75 | Temp 98.5°F | Ht 68.0 in | Wt 288.0 lb

## 2023-05-28 DIAGNOSIS — F3289 Other specified depressive episodes: Secondary | ICD-10-CM | POA: Diagnosis not present

## 2023-05-28 DIAGNOSIS — F418 Other specified anxiety disorders: Secondary | ICD-10-CM

## 2023-05-28 DIAGNOSIS — I10 Essential (primary) hypertension: Secondary | ICD-10-CM

## 2023-05-28 NOTE — Progress Notes (Signed)
 Patient presents today for a BP check, patient currently taking amLODipine 5mg  AM.  BP Readings from Last 3 Encounters:  05/28/23 120/82  05/06/23 (!) 160/110  06/27/22 136/80   Per provider- much better continue current medications

## 2023-05-28 NOTE — Progress Notes (Signed)
 [facility] Counselor/Therapist Progress Note  Patient ID: Patricia Boyer, MRN: [id number]   Date: 05/28/23  Time Spent: 4:03 pm -  5:04 pm : 61 Minutes  Treatment Type: Individual Therapy.  Reported Symptoms: Depression and Anxiety.   Mental Status Exam: Appearance:  Casual     Behavior: Appropriate  Motor: Normal  Speech/Language:  Clear and Coherent  Affect: Congruent  Mood: anxious  Thought process: normal  Thought content:   WNL  Sensory/Perceptual disturbances:   WNL  Orientation: oriented to person, place, time/date, and situation  Attention: Good  Concentration: Good  Memory: WNL  Fund of knowledge:  Good  Insight:   Good  Judgment:  Good  Impulse Control: Good   Risk Assessment: Danger to Self:  No Self-injurious Behavior: No Danger to Others: No Duty to Warn:no Physical Aggression / Violence:No  Access to Firearms a concern: No  Gang Involvement:No   Subjective:   Patricia Boyer participated from home, via video and consented to treatment. Therapist participated from office. Charl reviewed the events of the past week.She noted recent stress while traveling for work and noted being delayed. She reflected other people's behavior during this stressful travel and noted their difficult managing their frustration. She noted returning to work and noted a lack of clarity regarding her upcoming office assignment, having a medical accommodations and noted that her private medical information being shared previously, and a general lack of communication. She noted a sense of uncertainty and noted feeling "unsettled". We worked on processing this during the session. She noted a recent disagreement with her partner centered around her partner not advocating for her with his antagonistic family and a lack of common goals including having children. We worked on exploring this during the session. She noted her significant other, Alex, communicating interest in  getting married and having children. We worked on exploring this during the session. Therapist encouraged Deari to create a list of needs in her relationship, highlight which are being met and list said needs in order of importance. We will work on assertive communication going forward. Laquetta was engaged and motivated during the session. She expressed commitment towards session goals. Therapist validated Ermelinda's feelings and experience, praised her efforts to communicate her needs to her significant other, and provided supportive therapy. A follow-up was scheduled for continued treatment which she benefits from.   Diagnosis:  Other specified anxiety disorders  Other depression  Intervention:  CBT Interpersonal  Treatment Plan:  Client Abilities/Strengths Jamina is intelligent, well educated, and motivated for change.   Support System: Family and friends.   Client Treatment Preferences Outpatient Therapy  Client Statement of Needs Korrina would like to work on managing day-to-day stressors, boundaries for self and others, managing rumination, work and home-life balance, identifying areas of control and lack of control, managing distress tolerance at work, reducing personalizing at work, assertiveness, focusing on positives, managing symptoms, processing past events, verbalize feelings, engaging in mindfulness, process past events, engaging in self-care, improve self-esteem, and maintain consistency.   Treatment Level Weekly  Symptoms  Depression:  loss of interest, feeling down, poor sleep, lethargy, fluctuating appetite, feeling bad about self,  and difficulty concentrating.  (Status: maintained) Anxiety: Feeling anxious, difficulty managing worry, worrying about different things, trouble relaxing, irritability.  (Status: maintained)  Goals:   Montzerrat experiences symptoms of depression and anxiety.    Target Date: 12/27/23 Frequency: Weekly  Progress: 0 Modality: individual     Therapist will provide referrals for additional resources as  appropriate.  Therapist will provide psycho-education regarding Sandrine's diagnosis and corresponding treatment approaches and interventions. Licensed Clinical Social Worker, [place], LCSW will support the patient's ability to achieve the goals identified. will employ CBT, BA, Problem-solving, Solution Focused, Mindfulness,  coping skills, & other evidenced-based practices will be used to promote progress towards healthy functioning to help manage decrease symptoms associated with her diagnosis.   Reduce overall level, frequency, and intensity of the feelings of depression, anxiety and panic evidenced by decreased overall symptoms from 6 to 7 days/week to 0 to 1 days/week per client report for at least 3 consecutive months. Verbally express understanding of the relationship between feelings of depression, anxiety and their impact on thinking patterns and behaviors. Verbalize an understanding of the role that distorted thinking plays in creating fears, excessive worry, and ruminations.    [name] participated in the creation of the treatment plan)   Belva Boyden, LCSW

## 2023-06-10 ENCOUNTER — Ambulatory Visit (INDEPENDENT_AMBULATORY_CARE_PROVIDER_SITE_OTHER): Admitting: Psychology

## 2023-06-10 DIAGNOSIS — F3289 Other specified depressive episodes: Secondary | ICD-10-CM

## 2023-06-10 DIAGNOSIS — F418 Other specified anxiety disorders: Secondary | ICD-10-CM

## 2023-06-10 NOTE — Progress Notes (Signed)
 [facility] Counselor/Therapist Progress Note  Patient ID: Patricia Boyer, MRN: [id number]   Date: 06/10/23  Time Spent: 8:04 am -  8:59  am : 55 Minutes  Treatment Type: Individual Therapy.  Reported Symptoms: Depression and Anxiety.   Mental Status Exam: Appearance:  Casual     Behavior: Appropriate  Motor: Normal  Speech/Language:  Clear and Coherent  Affect: Congruent  Mood: anxious  Thought process: normal  Thought content:   WNL  Sensory/Perceptual disturbances:   WNL  Orientation: oriented to person, place, time/date, and situation  Attention: Good  Concentration: Good  Memory: WNL  Fund of knowledge:  Good  Insight:   Good  Judgment:  Good  Impulse Control: Good   Risk Assessment: Danger to Self:  No Self-injurious Behavior: No Danger to Others: No Duty to Warn:no Physical Aggression / Violence:No  Access to Firearms a concern: No  Gang Involvement:No   Subjective:   Patricia Boyer participated from home, via video and consented to treatment. Therapist participated from office. Patricia Boyer reviewed the events of the past week.She note a lack of communication regarding an impending move and not receiving any clarity. She noted a recent stressor at work and noted having to advocate for self and set boundaries with an unreasonable request. She noted having to set boundaries with her supervisor. She noted working on maintaining said boundaries, focusing on positives, and engaging in a positive manner. She noted continued stressors with her partner, Patricia Boyer, and discussed that many discussions occur over text. She noted the stress this is causing for her and in the relationship. We worked on exploring this during the session. We reviewed assertiveness, use of empathy, conflict resolution, and furthering techniques. Therapist encouraged Patricia Boyer to identify needs and time-lines. Therapist will provide resources for couple's counseling, which is recommended, upon  request. Therapist validated Patricia Boyer's feelings and experience during the session. Therapist praised Patricia Boyer for her effort to manage her work related stressors. Therapist validated Patricia Boyer's feelings and experience and provided supportive therapy. A follow-up was scheduled for continued treatment, which she benefits from.   Diagnosis:  Other specified anxiety disorders  Other depression  Intervention:  CBT Interpersonal  Treatment Plan:  Client Abilities/Strengths Patricia Boyer is intelligent, well educated, and motivated for change.   Support System: Family and friends.   Client Treatment Preferences Outpatient Therapy  Client Statement of Needs Patricia Boyer would like to work on managing day-to-day stressors, boundaries for self and others, managing rumination, work and home-life balance, identifying areas of control and lack of control, managing distress tolerance at work, reducing personalizing at work, assertiveness, focusing on positives, managing symptoms, processing past events, verbalize feelings, engaging in mindfulness, process past events, engaging in self-care, improve self-esteem, and maintain consistency.   Treatment Level Weekly  Symptoms  Depression:  loss of interest, feeling down, poor sleep, lethargy, fluctuating appetite, feeling bad about self,  and difficulty concentrating.  (Status: maintained) Anxiety: Feeling anxious, difficulty managing worry, worrying about different things, trouble relaxing, irritability.  (Status: maintained)  Goals:   Patricia Boyer experiences symptoms of depression and anxiety.    Target Date: 12/27/23 Frequency: Weekly  Progress: 0 Modality: individual    Therapist will provide referrals for additional resources as appropriate.  Therapist will provide psycho-education regarding Patricia Boyer diagnosis and corresponding treatment approaches and interventions. Licensed Clinical Social Worker, [place], LCSW will support the patient's ability to  achieve the goals identified. will employ CBT, BA, Problem-solving, Solution Focused, Mindfulness,  coping skills, & other evidenced-based practices will  be used to promote progress towards healthy functioning to help manage decrease symptoms associated with her diagnosis.   Reduce overall level, frequency, and intensity of the feelings of depression, anxiety and panic evidenced by decreased overall symptoms from 6 to 7 days/week to 0 to 1 days/week per client report for at least 3 consecutive months. Verbally express understanding of the relationship between feelings of depression, anxiety and their impact on thinking patterns and behaviors. Verbalize an understanding of the role that distorted thinking plays in creating fears, excessive worry, and ruminations.    [name] participated in the creation of the treatment plan)   Belva Boyden, LCSW

## 2023-06-25 ENCOUNTER — Ambulatory Visit (INDEPENDENT_AMBULATORY_CARE_PROVIDER_SITE_OTHER): Admitting: Psychology

## 2023-06-25 DIAGNOSIS — F418 Other specified anxiety disorders: Secondary | ICD-10-CM | POA: Diagnosis not present

## 2023-06-25 DIAGNOSIS — F3289 Other specified depressive episodes: Secondary | ICD-10-CM

## 2023-06-25 NOTE — Progress Notes (Addendum)
 [facility] Counselor/Therapist Progress Note  Patient ID: Patricia Boyer, MRN: [id number]   Date: 06/25/23  Time Spent: 9:04 am -  9:55 am : 51 Minutes  Treatment Type: Individual Therapy.  Reported Symptoms: Depression and Anxiety.   Mental Status Exam: Appearance:  Casual     Behavior: Appropriate  Motor: Normal  Speech/Language:  Clear and Coherent  Affect: Congruent  Mood: dysthymic  Thought process: normal  Thought content:   WNL  Sensory/Perceptual disturbances:   WNL  Orientation: oriented to person, place, time/date, and situation  Attention: Good  Concentration: Good  Memory: WNL  Fund of knowledge:  Good  Insight:   Good  Judgment:  Good  Impulse Control: Good   Risk Assessment: Danger to Self:  No Self-injurious Behavior: No Danger to Others: No Duty to Warn:no Physical Aggression / Violence:No  Access to Firearms a concern: No  Gang Involvement:No   Subjective:   Patricia Boyer participated from home, via video and consented to treatment. Therapist participated from office. Patricia Boyer reviewed the events of the past week. 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 She noted frustration with the unknown. She noted her attempts to employ positive self-talk and thinking but this has been a struggle. We worked on exploring this during the session. She noted frustration regarding various interactions. We worked on exploring this during the session. We worked on identifying ways to manage distress, communicate positively and assertively, and maintain boundaries. We worked on identifying areas of control and lack of control. She provided an update regarding her relationship with Fabio Holts and noted that he agreed to attend couples counseling despite some trepidation on his part.We worked on exploring this during the session. She noted concerns regarding boundaries with sharing information,  a lack of time spent together, a lack of follow through on marriage and having children. Therapist encouraged Patricia Boyer to identify areas of concentration and progress to be discussed in couple's counseling. Therapist will provide Patricia Boyer with local providers for counseling counselors, via email. Therapist validated Patricia Boyer's feelings and experience and provided supportive therapy.  A follow-up was scheduled for continued treatment, which she benefits from.   Diagnosis:  Other specified anxiety disorders  Other depression  Intervention:  CBT Interpersonal  Treatment Plan:  Client Abilities/Strengths Patricia Boyer is intelligent, well educated, and motivated for change.   Support System: Family and friends.   Client Treatment Preferences Outpatient Therapy  Client Statement of Needs Patricia Boyer would like to work on managing day-to-day stressors, boundaries for self and others, managing rumination, work and home-life balance, identifying areas of control and lack of control, managing distress tolerance at work, reducing personalizing at work, assertiveness, focusing on positives, managing symptoms, processing past events, verbalize feelings, engaging in mindfulness, process past events, engaging in self-care, improve self-esteem, and maintain consistency.   Treatment Level Weekly  Symptoms  Depression:  loss of interest, feeling down, poor sleep, lethargy, fluctuating appetite, feeling bad about self,  and difficulty concentrating.  (Status: maintained) Anxiety: Feeling anxious, difficulty managing worry, worrying about different things, trouble relaxing, irritability.  (Status: maintained)  Goals:   Patricia Boyer experiences symptoms of depression and anxiety.    Target Date: 12/27/23 Frequency: Weekly  Progress: 10% Modality: individual    Therapist will provide referrals for additional resources as appropriate.  Therapist will provide psycho-education regarding Patricia Boyer's diagnosis and  corresponding treatment approaches and interventions. Licensed Clinical Social Worker, [place], LCSW will support the patient's ability  to achieve the goals identified. will employ CBT, BA, Problem-solving, Solution Focused, Mindfulness,  coping skills, & other evidenced-based practices will be used to promote progress towards healthy functioning to help manage decrease symptoms associated with her diagnosis.   Reduce overall level, frequency, and intensity of the feelings of depression, anxiety and panic evidenced by decreased overall symptoms from 6 to 7 days/week to 0 to 1 days/week per client report for at least 3 consecutive months. Verbally express understanding of the relationship between feelings of depression, anxiety and their impact on thinking patterns and behaviors. Verbalize an understanding of the role that distorted thinking plays in creating fears, excessive worry, and ruminations.    [name] participated in the creation of the treatment plan)   Belva Boyden, LCSW

## 2023-07-11 ENCOUNTER — Ambulatory Visit: Admitting: Psychology

## 2023-07-11 DIAGNOSIS — F418 Other specified anxiety disorders: Secondary | ICD-10-CM | POA: Diagnosis not present

## 2023-07-11 DIAGNOSIS — F3289 Other specified depressive episodes: Secondary | ICD-10-CM | POA: Diagnosis not present

## 2023-07-11 NOTE — Progress Notes (Signed)
 [facility] Counselor/Therapist Progress Note  Patient ID: MILIKA VENTRESS, MRN: [id number]   Date: 07/11/23  Time Spent: 8:06 am -  9:01 am : 55 Minutes  Treatment Type: Individual Therapy.  Reported Symptoms: Depression and Anxiety.   Mental Status Exam: Appearance:  Casual     Behavior: Appropriate  Motor: Normal  Speech/Language:  Clear and Coherent  Affect: Congruent  Mood: dysthymic  Thought process: normal  Thought content:   WNL  Sensory/Perceptual disturbances:   WNL  Orientation: oriented to person, place, time/date, and situation  Attention: Good  Concentration: Good  Memory: WNL  Fund of knowledge:  Good  Insight:   Good  Judgment:  Good  Impulse Control: Good   Risk Assessment: Danger to Self:  No Self-injurious Behavior: No Danger to Others: No Duty to Warn:no Physical Aggression / Violence:No  Access to Firearms a concern: No  Gang Involvement:No   Subjective:   [name] participated from home, via video and consented to treatment. Therapist participated from office. Ami reviewed the events of the past week. She noted being slated for a four day training and noted her disinterest in this training due to various reasons. She noted feeling "very uncomfortable" in this setting. She noted this training being centered on communication and professionalism and noted this being hypocritical due to the lack of both in her work setting. We worked on processing her feelings, discussed the pros and cons of not attending. She noted frustration regarding the dynamics at work and the general atmosphere. We processed her feelings and therapist highlighted Rynn's avoidance of stressful situations at work and provided psycho-education regarding avoidance and the increase in psychological and physiological tension. We worked on reframing this in a more positive and adaptive manner in a way that aligns with her values. We worked on highlighting her areas  of control and lack of control and worked on identifying where to focus going forward. Therapist encouraged Aisa to create a list of where she has and not have control and working on addressing the areas that she does have control in a positive manner. We worked on reducing focus on mild irritants and differentiating between various types of irritants.Therapist modeled this during the session. Angalena was engaged and motivated during the session. She expressed commitment towards goals. Therapist validated Jetaun's feelings and experience and provided supportive therapy. A Follow-up was scheduled for continued treatment, which she benefits from.   Diagnosis:  Other specified anxiety disorders  Other depression  Intervention:  CBT Interpersonal  Treatment Plan:  Client Abilities/Strengths Taneshia is intelligent, well educated, and motivated for change.   Support System: Family and friends.   Client Treatment Preferences Outpatient Therapy  Client Statement of Needs Allycia would like to work on managing day-to-day stressors, boundaries for self and others, managing rumination, work and home-life balance, identifying areas of control and lack of control, managing distress tolerance at work, reducing personalizing at work, assertiveness, focusing on positives, managing symptoms, processing past events, verbalize feelings, engaging in mindfulness, process past events, engaging in self-care, improve self-esteem, and maintain consistency.   Treatment Level Weekly  Symptoms  Depression:  loss of interest, feeling down, poor sleep, lethargy, fluctuating appetite, feeling bad about self,  and difficulty concentrating.  (Status: maintained) Anxiety: Feeling anxious, difficulty managing worry, worrying about different things, trouble relaxing, irritability.  (Status: maintained)  Goals:   Alexea experiences symptoms of depression and anxiety.    Target Date: 12/27/23 Frequency: Weekly   Progress: 10% Modality:  individual    Therapist will provide referrals for additional resources as appropriate.  Therapist will provide psycho-education regarding Irem's diagnosis and corresponding treatment approaches and interventions. Licensed Clinical Social Worker, [place], LCSW will support the patient's ability to achieve the goals identified. will employ CBT, BA, Problem-solving, Solution Focused, Mindfulness,  coping skills, & other evidenced-based practices will be used to promote progress towards healthy functioning to help manage decrease symptoms associated with her diagnosis.   Reduce overall level, frequency, and intensity of the feelings of depression, anxiety and panic evidenced by decreased overall symptoms from 6 to 7 days/week to 0 to 1 days/week per client report for at least 3 consecutive months. Verbally express understanding of the relationship between feelings of depression, anxiety and their impact on thinking patterns and behaviors. Verbalize an understanding of the role that distorted thinking plays in creating fears, excessive worry, and ruminations.    [name] participated in the creation of the treatment plan)   Belva Boyden, LCSW

## 2023-07-25 ENCOUNTER — Encounter: Payer: Self-pay | Admitting: Nurse Practitioner

## 2023-07-26 ENCOUNTER — Ambulatory Visit (INDEPENDENT_AMBULATORY_CARE_PROVIDER_SITE_OTHER): Admitting: Psychology

## 2023-07-26 DIAGNOSIS — F418 Other specified anxiety disorders: Secondary | ICD-10-CM

## 2023-07-26 DIAGNOSIS — F3289 Other specified depressive episodes: Secondary | ICD-10-CM | POA: Diagnosis not present

## 2023-07-26 NOTE — Progress Notes (Unsigned)
  Behavioral Health Counselor/Therapist Progress Note  Patient ID: Patricia Boyer, MRN: [id number]   Date: 07/26/23  Time Spent: 8:05 am -  9:00 am : 55 Minutes  Treatment Type: Individual Therapy.  Reported Symptoms: Depression and Anxiety.   Mental Status Exam: Appearance:  Casual     Behavior: Appropriate  Motor: Normal  Speech/Language:  Clear and Coherent  Affect: Congruent  Mood: dysthymic  Thought process: normal  Thought content:   WNL  Sensory/Perceptual disturbances:   WNL  Orientation: oriented to person, place, time/date, and situation  Attention: Good  Concentration: Good  Memory: WNL  Fund of knowledge:  Good  Insight:   Good  Judgment:  Good  Impulse Control: Good   Risk Assessment: Danger to Self:  No Self-injurious Behavior: No Danger to Others: No Duty to Warn:no Physical Aggression / Violence:No  Access to Firearms a concern: No  Gang Involvement:No   Subjective:   Patricia Boyer participated from home, via video and consented to treatment. Therapist participated from home office. Patricia Boyer reviewed the events of the past week. She noted a continued lack of traction with her work move, a lack of transparency, and lack of commitment towards receiving the necessary resources for her job. She noted worry that she would not receive the necessary accommodations due to various health issue. We worked on processing this during the session. She noted recent trip with the family and noted her father's persistent efforts to control all aspects of the trip and changing the trip specifics throughout the trip. She noted her frustration regarding this behavior. She noted frustration regarding her father's behavior which include mixed messages, antagonism, need for control, and lack of follow through with verbal commitments. She noted frustration regarding her father's treatment of her mother. We worked on processing these stressors during the session.  She noted  her father's family behaving in the same manner and noted her efforts to maintain distance. She noted feeling conflicted about this and wishing she had a relationship with her family. We worked on processing this during the session.  Expectations Boundaries    he noted being slated for a four day training and noted her disinterest in this training due to various reasons. She noted feeling very uncomfortable in this setting. She noted this training being centered on communication and professionalism and noted this being hypocritical due to the lack of both in her work setting. We worked on processing her feelings, discussed the pros and cons of not attending. She noted frustration regarding the dynamics at work and the general atmosphere. We processed her feelings and therapist highlighted Patricia Boyer's avoidance of stressful situations at work and provided psycho-education regarding avoidance and the increase in psychological and physiological tension. We worked on reframing this in a more positive and adaptive manner in a way that aligns with her values. We worked on highlighting her areas of control and lack of control and worked on identifying where to focus going forward. Therapist encouraged Patricia Boyer to create a list of where she has and not have control and working on addressing the areas that she does have control in a positive manner. We worked on reducing focus on mild irritants and differentiating between various types of irritants.Therapist modeled this during the session. Patricia Boyer was engaged and motivated during the session. She expressed commitment towards goals. Therapist validated Patricia Boyer's feelings and experience and provided supportive therapy. A Follow-up was scheduled for continued treatment, which she benefits from.   Diagnosis:  Other specified anxiety  disorders  Other depression  Intervention:  CBT Interpersonal  Treatment Plan:  Client Abilities/Strengths Patricia Boyer is intelligent,  well educated, and motivated for change.   Support System: Family and friends.   Client Treatment Preferences Outpatient Therapy  Client Statement of Needs Patricia Boyer would like to work on managing day-to-day stressors, boundaries for self and others, managing rumination, work and home-life balance, identifying areas of control and lack of control, managing distress tolerance at work, reducing personalizing at work, assertiveness, focusing on positives, managing symptoms, processing past events, verbalize feelings, engaging in mindfulness, process past events, engaging in self-care, improve self-esteem, and maintain consistency.   Treatment Level Weekly  Symptoms  Depression:  loss of interest, feeling down, poor sleep, lethargy, fluctuating appetite, feeling bad about self,  and difficulty concentrating.  (Status: maintained) Anxiety: Feeling anxious, difficulty managing worry, worrying about different things, trouble relaxing, irritability.  (Status: maintained)  Goals:   Patricia Boyer experiences symptoms of depression and anxiety.    Target Date: 12/27/23 Frequency: Weekly  Progress: 10% Modality: individual    Therapist will provide referrals for additional resources as appropriate.  Therapist will provide psycho-education regarding Patricia Boyer diagnosis and corresponding treatment approaches and interventions. Licensed Clinical Social Worker, [place], LCSW will support the patient's ability to achieve the goals identified. will employ CBT, BA, Problem-solving, Solution Focused, Mindfulness,  coping skills, & other evidenced-based practices will be used to promote progress towards healthy functioning to help manage decrease symptoms associated with her diagnosis.   Reduce overall level, frequency, and intensity of the feelings of depression, anxiety and panic evidenced by decreased overall symptoms from 6 to 7 days/week to 0 to 1 days/week per client report for at least 3 consecutive  months. Verbally express understanding of the relationship between feelings of depression, anxiety and their impact on thinking patterns and behaviors. Verbalize an understanding of the role that distorted thinking plays in creating fears, excessive worry, and ruminations.    [name] participated in the creation of the treatment plan)   Belva Boyden, LCSW

## 2023-07-29 ENCOUNTER — Telehealth: Payer: Self-pay

## 2023-07-29 DIAGNOSIS — I10 Essential (primary) hypertension: Secondary | ICD-10-CM

## 2023-07-29 MED ORDER — LABETALOL HCL 100 MG PO TABS: 100.0000 mg | ORAL_TABLET | Freq: Once | ORAL | 1 refills | Status: DC

## 2023-07-29 NOTE — Telephone Encounter (Signed)
 Called patient to let her know to stop amlodipine  and buspirone  and start labetalol since she has had a positive pregnancy test. Patient was also advised to take Trintellix  every other day until seen on Wednesday.

## 2023-07-31 ENCOUNTER — Encounter: Payer: Self-pay | Admitting: Nurse Practitioner

## 2023-07-31 ENCOUNTER — Ambulatory Visit: Payer: Self-pay | Admitting: Nurse Practitioner

## 2023-07-31 VITALS — BP 118/70 | HR 87 | Temp 98.5°F | Ht 68.0 in | Wt 291.8 lb

## 2023-07-31 DIAGNOSIS — F3341 Major depressive disorder, recurrent, in partial remission: Secondary | ICD-10-CM

## 2023-07-31 DIAGNOSIS — F419 Anxiety disorder, unspecified: Secondary | ICD-10-CM | POA: Diagnosis not present

## 2023-07-31 DIAGNOSIS — I10 Essential (primary) hypertension: Secondary | ICD-10-CM | POA: Diagnosis not present

## 2023-07-31 DIAGNOSIS — Z3201 Encounter for pregnancy test, result positive: Secondary | ICD-10-CM

## 2023-07-31 DIAGNOSIS — Z6838 Body mass index (BMI) 38.0-38.9, adult: Secondary | ICD-10-CM

## 2023-07-31 DIAGNOSIS — F32A Depression, unspecified: Secondary | ICD-10-CM | POA: Insufficient documentation

## 2023-07-31 LAB — POCT URINE PREGNANCY: Preg Test, Ur: POSITIVE — AB

## 2023-07-31 MED ORDER — PRENATAL 27-0.8 MG PO TABS: 1.0000 | ORAL_TABLET | Freq: Every day | ORAL | 0 refills | Status: DC

## 2023-07-31 MED ORDER — FOLIC ACID 1 MG PO TABS: 1.0000 mg | ORAL_TABLET | Freq: Every day | ORAL | 3 refills | Status: AC

## 2023-07-31 NOTE — Progress Notes (Signed)
 [place], CMA,acting as a Neurosurgeon for Patricia Epley, FNP.,have documented all relevant documentation on the behalf of Patricia Epley, FNP,as directed by  Patricia Epley, FNP while in the presence of Patricia Epley, FNP.  Subjective:  Patient ID: Patricia Boyer , female    DOB: 04/27/1980 , 43 y.o.   MRN: [id number]  Chief Complaint  Patient presents with   Positive Pregnancy Test    Patient presents today for a positive at home pregnancy test. Patient reports she took the test a week ago. Patient doesn't have a OBGYN.     HPI  First day of last menstrual cycle was May 3rd. This is her first pregnancy. She is having some emotions due to the timing of her pregnancy but plans to keep this baby.      Past Medical History:  Diagnosis Date   Anemia    Anxiety    Arthritis    Cataract    Depression    GERD (gastroesophageal reflux disease)      Family History  Problem Relation Age of Onset   Hypertension Mother    Sleep apnea Mother    Arthritis Father    Vision loss Father    Diabetes Maternal Grandmother    Diabetes Maternal Uncle      Current Outpatient Medications:    folic acid (FOLVITE) 1 MG tablet, Take 1 tablet (1 mg total) by mouth daily., Disp: 100 tablet, Rfl: 3   labetalol (NORMODYNE) 100 MG tablet, Take 1 tablet (100 mg total) by mouth once for 1 dose., Disp: 90 tablet, Rfl: 1   Prenatal Vit-Fe Fumarate-FA (MULTIVITAMIN-PRENATAL) 27-0.8 MG TABS tablet, Take 1 tablet by mouth daily at 12 noon., Disp: 30 tablet, Rfl: 0   vortioxetine  HBr (TRINTELLIX ) 10 MG TABS tablet, Take 1 tablet (10 mg total) by mouth daily. (Patient taking differently: Take 10 mg by mouth daily. Taking every other day as of now.), Disp: 90 tablet, Rfl: 1   No Known Allergies   Review of Systems  Constitutional: Negative.   Respiratory: Negative.    Cardiovascular: Negative.   Neurological: Negative.   Psychiatric/Behavioral: Negative.       Today's Vitals   07/31/23 1217  BP: 118/70   Pulse: 87  Temp: 98.5 F (36.9 C)  TempSrc: Oral  Weight: 291 lb 12.8 oz (132.4 kg)  Height: 5' 8 (1.727 m)  PainSc: 0-No pain   Body mass index is 44.37 kg/m.  Wt Readings from Last 3 Encounters:  07/31/23 291 lb 12.8 oz (132.4 kg)  05/28/23 288 lb (130.6 kg)  05/06/23 288 lb (130.6 kg)      Objective:  Physical Exam Vitals and nursing note reviewed.  Constitutional:      General: She is not in acute distress.    Appearance: Normal appearance. She is obese.   Cardiovascular:     Rate and Rhythm: Normal rate and regular rhythm.     Pulses: Normal pulses.     Heart sounds: Normal heart sounds. No murmur heard. Pulmonary:     Effort: Pulmonary effort is normal. No respiratory distress.     Breath sounds: Normal breath sounds. No wheezing.   Skin:    General: Skin is warm and dry.     Capillary Refill: Capillary refill takes less than 2 seconds.   Neurological:     General: No focal deficit present.     Mental Status: She is alert and oriented to person, place, and time.     Cranial  Nerves: No cranial nerve deficit.     Motor: No weakness.   Psychiatric:        Mood and Affect: Mood normal. Mood is not anxious. Affect is tearful. Affect is not flat.        Behavior: Behavior normal.        Thought Content: Thought content normal.        Judgment: Judgment normal.        07/31/2023   12:25 PM 12/14/2022    8:10 AM 06/27/2022   12:06 PM 06/27/2022   11:36 AM 03/01/2022    9:06 AM  Depression screen PHQ 2/9  Decreased Interest 0  0 0 0  Down, Depressed, Hopeless 1  0 0 0  PHQ - 2 Score 1  0 0 0  Altered sleeping 3  0  3  Tired, decreased energy 1  0  3  Change in appetite 1  0  3  Feeling bad or failure about yourself  1  0  3  Trouble concentrating 0  0  3  Moving slowly or fidgety/restless 0  0  0  Suicidal thoughts 0  0  0  PHQ-9 Score 7  0  15  Difficult doing work/chores Not difficult at all  Not difficult at all       Information is confidential and  restricted. Go to Review Flowsheets to unlock data.      07/31/2023   12:25 PM 12/14/2022    8:08 AM 11/27/2021    5:25 PM 05/26/2020    3:16 PM  GAD 7 : Generalized Anxiety Score  Nervous, Anxious, on Edge 1   1  Control/stop worrying 1   1  Worry too much - different things 1   1  Trouble relaxing 1   1  Restless 0   2  Easily annoyed or irritable 0   2  Afraid - awful might happen 1   1  Total GAD 7 Score 5   9  Anxiety Difficulty Not difficult at all   Somewhat difficult     Information is confidential and restricted. Go to Review Flowsheets to unlock data.        Assessment And Plan:  Positive urine pregnancy test Assessment & Plan: Will check Beta Hcg to estimate gestation, will also refer to OB she is high risk. I sent Rx for prenatal vitamins and folic acid. She is a little overwhelmed with the news due to various reasons.  Orders: -     POCT urine pregnancy -     Beta hCG quant (ref lab) -     Ambulatory referral to Obstetrics / Gynecology -     Prenatal; Take 1 tablet by mouth daily at 12 noon.  Dispense: 30 tablet; Refill: 0 -     Folic Acid; Take 1 tablet (1 mg total) by mouth daily.  Dispense: 100 tablet; Refill: 3  Recurrent major depressive disorder, in partial remission (HCC) Assessment & Plan: Depression screen score 7, she is weaning off of trintellix , taking 1 tab  by mouth every other day for one week then stop.    Anxiety Assessment & Plan: I have discontinued her buspar  due to now being pregnant   BMI 38.0-38.9,adult Assessment & Plan: She is encouraged to strive for BMI less than 30 to decrease cardiac risk. Advised to aim for at least 150 minutes of exercise per week as tolerated.    Essential hypertension Assessment & Plan: BP is well controlled,  we have switched her amlodipine  to labetolol 100 mg daily.     Return for keep same next..  Patient was given opportunity to ask questions. Patient verbalized understanding of the plan and was  able to repeat key elements of the plan. All questions were answered to their satisfaction.    Inge Mangle, FNP, have reviewed all documentation for this visit. The documentation on 07/31/23 for the exam, diagnosis, procedures, and orders are all accurate and complete.   IF YOU HAVE BEEN REFERRED TO A SPECIALIST, IT MAY TAKE 1-2 WEEKS TO SCHEDULE/PROCESS THE REFERRAL. IF YOU HAVE NOT HEARD FROM US /SPECIALIST IN TWO WEEKS, PLEASE GIVE US  A CALL AT [phone].

## 2023-07-31 NOTE — Assessment & Plan Note (Addendum)
 Will check Beta Hcg to estimate gestation, will also refer to OB she is high risk. I sent Rx for prenatal vitamins and folic acid. She is a little overwhelmed with the news due to various reasons.

## 2023-07-31 NOTE — Assessment & Plan Note (Addendum)
 She is encouraged to strive for BMI less than 30 to decrease cardiac risk. Advised to aim for at least 150 minutes of exercise per week as tolerated

## 2023-07-31 NOTE — Assessment & Plan Note (Signed)
 I have discontinued her buspar  due to now being pregnant

## 2023-07-31 NOTE — Assessment & Plan Note (Signed)
 BP is well controlled, we have switched her amlodipine  to labetolol 100 mg daily.

## 2023-07-31 NOTE — Assessment & Plan Note (Signed)
 Depression screen score 7, she is weaning off of trintellix , taking 1 tab  by mouth every other day for one week then stop.

## 2023-08-01 LAB — BETA HCG QUANT (REF LAB): hCG Quant: 63713 m[IU]/mL

## 2023-08-15 ENCOUNTER — Ambulatory Visit: Admitting: Psychology

## 2023-08-15 DIAGNOSIS — F3289 Other specified depressive episodes: Secondary | ICD-10-CM

## 2023-08-15 DIAGNOSIS — F418 Other specified anxiety disorders: Secondary | ICD-10-CM

## 2023-08-15 NOTE — Progress Notes (Signed)
 [facility] Counselor/Therapist Progress Note  Patient ID: Patricia Boyer, MRN: [id number]   Date: 08/15/23  Time Spent: 9:05 am -  9:58 am : 53 Minutes  Treatment Type: Individual Therapy.  Reported Symptoms: Depression and Anxiety.   Mental Status Exam: Appearance:  Casual     Behavior: Appropriate  Motor: Normal  Speech/Language:  Clear and Coherent  Affect: Congruent  Mood: dysthymic  Thought process: normal  Thought content:   WNL  Sensory/Perceptual disturbances:   WNL  Orientation: oriented to person, place, time/date, and situation  Attention: Good  Concentration: Good  Memory: WNL  Fund of knowledge:  Good  Insight:   Good  Judgment:  Good  Impulse Control: Good   Risk Assessment: Danger to Self:  No Self-injurious Behavior: No Danger to Others: No Duty to Warn:no Physical Aggression / Violence:No  Access to Firearms a concern: No  Gang Involvement:No   Subjective:   Patricia Boyer participated from home, via video and consented to treatment. Therapist participated from home office. Patricia Boyer reviewed the events of the past week. She noted her mother currently visiting and noted this going well. She noted recently discovering that she is [redacted] weeks pregnant. She noted this being unexpected. She noted feeling nervous about the pregnancy and is slated to meet with an OBGYN. She noted being taken off all her medicine due to her pregnancy. Patricia Boyer noted a need to work on managing her work related anxiety related to work. She noted her self-talk at work and noted this needing improvement. Therapist highlighted that the self-talk is negative and that this might aid temporarily in managing frustrations but reinforced negative feelings and negative perception.  We challenged this during the session. We worked on identifying the pros and cons of this approach during the session. We worked on processing how she assesses various interactions. We discussed  ways  to  assess situations using evidence, identifying boundaries, being assertive, and differentiating between various stressors. We practiced this during the session.  We worked on processing this during the session including boundaries for others regarding notification of pregnancy. She discussed her recent 4-day training and her efforts to manage her mood and experience. She noted her efforts to not engage with antagonism. Therapist encouraged Patricia Boyer to engage in self-talk, differentiating between stressors, and to advocate for self. Therapist validated Patricia Boyer's feelings and experience and provided supportive therapy. A follow-up was scheduled for continued treatment, which she benefits from.   Diagnosis:  Other specified anxiety disorders  Other depression  Intervention:  CBT Interpersonal  Treatment Plan:  Client Abilities/Strengths Patricia Boyer is intelligent, well educated, and motivated for change.   Support System: Family and friends.   Client Treatment Preferences Outpatient Therapy  Client Statement of Needs Patricia Boyer would like to work on managing day-to-day stressors, boundaries for self and others, managing rumination, work and home-life balance, identifying areas of control and lack of control, managing distress tolerance at work, reducing personalizing at work, assertiveness, focusing on positives, managing symptoms, processing past events, verbalize feelings, engaging in mindfulness, process past events, engaging in self-care, improve self-esteem, and maintain consistency.   Treatment Level Weekly  Symptoms  Depression:  loss of interest, feeling down, poor sleep, lethargy, fluctuating appetite, feeling bad about self,  and difficulty concentrating.  (Status: maintained) Anxiety: Feeling anxious, difficulty managing worry, worrying about different things, trouble relaxing, irritability.  (Status: maintained)  Goals:   Patricia Boyer experiences symptoms of depression and anxiety.     Target Date: 12/27/23 Frequency: Weekly  Progress: 10% Modality: individual    Therapist will provide referrals for additional resources as appropriate.  Therapist will provide psycho-education regarding Patricia Boyer's diagnosis and corresponding treatment approaches and interventions. Licensed Clinical Social Worker, Patricia Boyer will support the patient's ability to achieve the goals identified. will employ CBT, BA, Problem-solving, Solution Focused, Mindfulness,  coping skills, & other evidenced-based practices will be used to promote progress towards healthy functioning to help manage decrease symptoms associated with her diagnosis.   Reduce overall level, frequency, and intensity of the feelings of depression, anxiety and panic evidenced by decreased overall symptoms from 6 to 7 days/week to 0 to 1 days/week per client report for at least 3 consecutive months. Verbally express understanding of the relationship between feelings of depression, anxiety and their impact on thinking patterns and behaviors. Verbalize an understanding of the role that distorted thinking plays in creating fears, excessive worry, and ruminations.    [name] participated in the creation of the treatment plan)   Patricia Mullet, Boyer

## 2023-08-24 ENCOUNTER — Inpatient Hospital Stay (HOSPITAL_COMMUNITY)
Admission: AD | Admit: 2023-08-24 | Discharge: 2023-08-24 | Disposition: A | Discharge status: Home / Self Care | Attending: Obstetrics and Gynecology | Admitting: Obstetrics and Gynecology

## 2023-08-24 ENCOUNTER — Encounter (HOSPITAL_COMMUNITY): Payer: Self-pay | Admitting: Obstetrics and Gynecology

## 2023-08-24 ENCOUNTER — Other Ambulatory Visit: Payer: Self-pay

## 2023-08-24 ENCOUNTER — Inpatient Hospital Stay (HOSPITAL_COMMUNITY)

## 2023-08-24 ENCOUNTER — Ambulatory Visit: Admission: EM | Admit: 2023-08-24 | Discharge: 2023-08-24 | Disposition: A | Discharge status: Home / Self Care

## 2023-08-24 DIAGNOSIS — O3411 Maternal care for benign tumor of corpus uteri, first trimester: Secondary | ICD-10-CM | POA: Insufficient documentation

## 2023-08-24 DIAGNOSIS — O4690 Antepartum hemorrhage, unspecified, unspecified trimester: Secondary | ICD-10-CM | POA: Diagnosis not present

## 2023-08-24 DIAGNOSIS — O10912 Unspecified pre-existing hypertension complicating pregnancy, second trimester: Secondary | ICD-10-CM | POA: Diagnosis not present

## 2023-08-24 DIAGNOSIS — Z3A10 10 weeks gestation of pregnancy: Secondary | ICD-10-CM

## 2023-08-24 DIAGNOSIS — Z79899 Other long term (current) drug therapy: Secondary | ICD-10-CM | POA: Diagnosis not present

## 2023-08-24 DIAGNOSIS — D259 Leiomyoma of uterus, unspecified: Secondary | ICD-10-CM | POA: Diagnosis not present

## 2023-08-24 DIAGNOSIS — O09521 Supervision of elderly multigravida, first trimester: Secondary | ICD-10-CM | POA: Insufficient documentation

## 2023-08-24 DIAGNOSIS — O3481 Maternal care for other abnormalities of pelvic organs, first trimester: Secondary | ICD-10-CM | POA: Insufficient documentation

## 2023-08-24 DIAGNOSIS — I10 Essential (primary) hypertension: Secondary | ICD-10-CM

## 2023-08-24 DIAGNOSIS — O10011 Pre-existing essential hypertension complicating pregnancy, first trimester: Secondary | ICD-10-CM

## 2023-08-24 DIAGNOSIS — O200 Threatened abortion: Secondary | ICD-10-CM | POA: Insufficient documentation

## 2023-08-24 MED ORDER — LABETALOL HCL 100 MG PO TABS: 100.0000 mg | ORAL_TABLET | Freq: Two times a day (BID) | ORAL | 1 refills | Status: DC

## 2023-08-24 MED ORDER — NIFEDIPINE ER OSMOTIC RELEASE 30 MG PO TB24: 30.0000 mg | ORAL_TABLET | Freq: Every day | ORAL | 11 refills | Status: DC

## 2023-08-24 MED ORDER — NIFEDIPINE ER OSMOTIC RELEASE 30 MG PO TB24
30.0000 mg | ORAL_TABLET | Freq: Once | ORAL | Status: AC
  Administered 2023-08-24: 30 mg via ORAL
  Filled 2023-08-24: qty 1

## 2023-08-24 NOTE — Discharge Instructions (Signed)
 Recommend further evaluation in the Maternity Admissions Unit today.

## 2023-08-24 NOTE — MAU Provider Note (Signed)
 History     Chief Complaint  Patient presents with   Vaginal Bleeding   43 yo G3P0020 SBF @ [redacted] wk gestation presents with c/o painless vaginal bleeding that started this am.  Denies recent intercourse. PMH notable for  Anxiety disorder, depression and  chronic HTN . 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    Family History  Problem Relation Age of Onset   Hypertension Mother    Sleep apnea Mother    Arthritis Father    Vision loss Father    Diabetes Maternal Grandmother    Diabetes Maternal Uncle     Social History   Tobacco Use   Smoking status: Never   Smokeless tobacco: Never  Substance Use Topics   Alcohol use: Yes    Alcohol/week: 1.0 standard drink of alcohol    Types: 1 Cans of beer per week   Drug use: No    Allergies: No Known Allergies  Medications Prior to Admission  Medication Sig Dispense Refill Last Dose/Taking   folic acid  (FOLVITE ) 1 MG tablet Take 1 tablet (1 mg total) by mouth daily. 100 tablet 3 08/24/2023   Prenatal Vit-Fe Fumarate-FA (MULTIVITAMIN-PRENATAL) 27-0.8 MG TABS tablet Take 1 tablet by mouth daily at 12 noon. 30 tablet 0 08/24/2023   labetalol  (NORMODYNE ) 100 MG tablet Take 1 tablet (100 mg total) by mouth once for 1 dose. (Patient taking differently: Take 100 mg by mouth once.) 90 tablet 1    vortioxetine  HBr (TRINTELLIX ) 10 MG TABS tablet Take 1 tablet (10 mg total) by mouth daily. (Patient taking differently: Take 10 mg by mouth daily. Taking every other day as of now.) 90 tablet  1      Physical Exam   Blood pressure (!) 158/105, pulse 82, temperature 98.4 F (36.9 C), temperature source Oral, resp. rate 15, height 5' 8 (1.727 m), weight 129.7 kg, last menstrual period 06/15/2023, SpO2 98%.  General appearance: alert, cooperative, and mildly obese Lungs: clear to auscultation bilaterally Heart: regular rate and rhythm, S1, S2 normal, no murmur, click, rub or gallop Abdomen: soft, non-tender; bowel sounds normal; no masses,  no organomegaly Pelvic: cervix normal in appearance, external genitalia normal, and vagina: scant blood . Cervix closed. Uterus 10 wk AF nontender Extremities: no edema, redness or tenderness in the calves or thighs   ,  ED Course  IMP: threatened abortion Chronic HTN affecting pregnancy IUP @ 10 wk P)pelvic sonogram.  MDM  Addendum US  OB Comp Less 14 Wks Result Date: 08/24/2023 CLINICAL DATA:  Vaginal bleeding in 1st trimester pregnancy. EXAM: OBSTETRIC <14 WK US  AND TRANSVAGINAL OB US  TECHNIQUE: Both transabdominal and transvaginal ultrasound examinations were  performed for complete evaluation of the gestation as well as the maternal uterus, adnexal regions, and pelvic cul-de-sac. Transvaginal technique was performed to assess early pregnancy. COMPARISON:  None Available. FINDINGS: Intrauterine gestational sac: Single Yolk sac:  Visualized. Embryo:  Visualized. Cardiac Activity: Visualized. Heart Rate: 161 bpm CRL:  36 mm   10 w   4 d                  US  EDC: 03/17/2024 Subchorionic hemorrhage:  None visualized. Maternal uterus/adnexae: 2 adjacent uterine fibroids are seen in the lower uterine segment, each measuring 2.2 cm in maximum diameter. 1.8 cm benign-appearing left ovarian cyst. Normal appearance of right ovary. No adnexal mass or abnormal free fluid identified. IMPRESSION: Single living IUP with estimated gestational age of [redacted] weeks 4 days. Two small uterine fibroids, each measuring 2.2 cm. Electronically Signed   By: Norleen DELENA Kil  M.D.   On: 08/24/2023 12:07   Reviewed pelvis sonogram with pt. Advised cause for bleeding not seen. Uterine fibroid not impacting pregnancy  Elevated BP noted. Pt was placed on daily labetalol  by PCP after d/c amlodipine .  Procardia  XL 30 mg po now and to increase labetalol  to 100 mg po bid She has been referred to [facility]N clinic to establish care there. Pt was given BP cuff to take home for monitoring BP. 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 Threatened abortion precautions reviewed.  D/c home Dickie DELENA Carder, MD 12:25 PM 08/24/2023

## 2023-08-24 NOTE — MAU Note (Signed)
 Patricia Boyer is a 43 y.o. at Unknown here in MAU reporting: sitting on a chair and felt something wet on her pants and went to the bathroom and noticed bright red bleeding in her underwear and pants. Denies any abdominal pain. One time episode. When she intially wiped she had 1 large clot the size of 50 cent piece. First OB appointment was Thursday 7/10 and had an US  and confirmed baby was in uterus with a FHR. Denies any unusual discharge, odor or itching. Denies any recent sexual intercourse.   LMP: 06/15/23 Onset of complaint: today Pain score: 0 Vitals:   08/24/23 1054  BP: (!) 158/105  Pulse: 82  Resp: 15  Temp: 98.4 F (36.9 C)  SpO2: 98%     FHT: attempted, unable to obtain  Lab orders placed from triage:

## 2023-08-24 NOTE — ED Provider Notes (Signed)
 [facility]    CSN: [id number] Arrival date & time: 08/24/23  0951      History   Chief Complaint Chief Complaint  Patient presents with   Vaginal Bleeding    HPI Patricia Boyer is a 43 y.o. female.   Patient currently about [redacted] weeks pregnant.  She reports today she began experiencing bright red bleeding with some clots.  Bleeding has now stopped.  She denies abdominal pain or cramping.  She has her first OB appointment scheduled for later this week.    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   Anxiety disorder 03/29/2022   Essential hypertension 03/29/2022   Hair loss 11/04/2021   Class 3 severe obesity due to excess calories without serious comorbidity with body mass index (BMI) of 40.0 to 44.9 in adult 08/21/2021   Vitamin D  deficiency 08/21/2021   BMI 38.0-38.9,adult 05/17/2012    Past Surgical History:  Procedure Laterality Date   EYE SURGERY  July 2022   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 Authorizing Provider  folic acid  (FOLVITE ) 1 MG tablet Take 1 tablet (1 mg total) by mouth daily. 07/31/23 07/30/24  Moore, Janece, FNP  labetalol  (NORMODYNE ) 100 MG tablet Take 1 tablet (100 mg total) by mouth once for 1 dose. 07/29/23 07/31/23  Georgina Speaks, FNP  Prenatal Vit-Fe Fumarate-FA (MULTIVITAMIN-PRENATAL)  27-0.8 MG TABS tablet Take 1 tablet by mouth daily at 12 noon. 07/31/23   Georgina Speaks, FNP  vortioxetine  HBr (TRINTELLIX ) 10 MG TABS tablet Take 1 tablet (10 mg total) by mouth daily. Patient taking differently: Take 10 mg by mouth daily. Taking every other day as of now. 03/01/22   Georgina Speaks, FNP    Family History Family History  Problem Relation Age of Onset   Hypertension Mother    Sleep apnea Mother    Arthritis Father    Vision loss Father    Diabetes Maternal Grandmother    Diabetes Maternal Uncle     Social History Social History   Tobacco Use   Smoking status: Never   Smokeless tobacco: Never  Substance Use Topics   Alcohol use: Yes    Alcohol/week: 1.0 standard drink of alcohol    Types: 1 Cans of beer per week   Drug use: No     Allergies   Patient has no known allergies.   Review of Systems Review of Systems  Constitutional:  Negative for chills and fever.  HENT:  Negative for ear  pain and sore throat.   Eyes:  Negative for pain and visual disturbance.  Respiratory:  Negative for cough and shortness of breath.   Cardiovascular:  Negative for chest pain and palpitations.  Gastrointestinal:  Negative for abdominal pain and vomiting.  Genitourinary:  Positive for vaginal bleeding. Negative for dysuria and hematuria.  Musculoskeletal:  Negative for arthralgias and back pain.  Skin:  Negative for color change and rash.  Neurological:  Negative for seizures and syncope.  All other systems reviewed and are negative.    Physical Exam Triage Vital Signs ED Triage Vitals [08/24/23 1005]  Encounter Vitals Group     BP (!) 133/90     Girls Systolic BP Percentile      Girls Diastolic BP Percentile      Boys Systolic BP Percentile      Boys Diastolic BP Percentile      Pulse Rate 82     Resp 16     Temp 98 F (36.7 C)     Temp Source Oral     SpO2 100 %     Weight      Height      Head Circumference      Peak Flow      Pain Score      Pain Loc       Pain Education      Exclude from Growth Chart    No data found.  Updated Vital Signs BP (!) 133/90 (BP Location: Left Arm)   Pulse 82   Temp 98 F (36.7 C) (Oral)   Resp 16   LMP 06/15/2023   SpO2 100%   Visual Acuity Right Eye Distance:   Left Eye Distance:   Bilateral Distance:    Right Eye Near:   Left Eye Near:    Bilateral Near:     Physical Exam Vitals and nursing note reviewed.  Constitutional:      General: She is not in acute distress.    Appearance: She is well-developed.  HENT:     Head: Normocephalic and atraumatic.  Eyes:     Conjunctiva/sclera: Conjunctivae normal.  Cardiovascular:     Rate and Rhythm: Normal rate and regular rhythm.     Heart sounds: No murmur heard. Pulmonary:     Effort: Pulmonary effort is normal. No respiratory distress.     Breath sounds: Normal breath sounds.  Abdominal:     Palpations: Abdomen is soft.     Tenderness: There is no abdominal tenderness.  Musculoskeletal:        General: No swelling.     Cervical back: Neck supple.  Skin:    General: Skin is warm and dry.     Capillary Refill: Capillary refill takes less than 2 seconds.  Neurological:     Mental Status: She is alert.  Psychiatric:        Mood and Affect: Mood normal.      UC Treatments / Results  Labs (all labs ordered are listed, but only abnormal results are displayed) Labs Reviewed - No data to display  EKG   Radiology No results found.  Procedures Procedures (including critical care time)  Medications Ordered in UC Medications - No data to display  Initial Impression / Assessment and Plan / UC Course  I have reviewed the triage vital signs and the nursing notes.  Pertinent labs & imaging results that were available during my care of the patient were reviewed by me and considered in my medical  decision making (see chart for details).     Given vaginal bleeding in early pregnancy recommend further evaluation at MAU.  Patient  agrees with plan and will go today. Final Clinical Impressions(s) / UC Diagnoses   Final diagnoses:  Vaginal bleeding in pregnancy     Discharge Instructions      Recommend further evaluation in the Maternity Admissions Unit today.     ED Prescriptions   None    PDMP not reviewed this encounter.   Ward, Harlene PEDLAR, PA-C 08/24/23 1011

## 2023-08-24 NOTE — ED Notes (Signed)
 Patient is being discharged from the Urgent Care and sent to the Emergency Department via POV . Per Harlene Sink, PA-C, patient is in need of higher level of care due to pregnant with vaginal bleeding. Patient is aware and verbalizes understanding of plan of care.  Vitals:   08/24/23 1005  BP: (!) 133/90  Pulse: 82  Resp: 16  Temp: 98 F (36.7 C)  SpO2: 100%

## 2023-08-24 NOTE — ED Triage Notes (Addendum)
 Pt reports she is [redacted] weeks pregnant. She began having BRB vaginal bleeding this morning around 0830 and passed a clot. States she had an US  on Thursday in her OB's office

## 2023-08-29 ENCOUNTER — Other Ambulatory Visit: Payer: Self-pay | Admitting: Nurse Practitioner

## 2023-08-29 DIAGNOSIS — Z3201 Encounter for pregnancy test, result positive: Secondary | ICD-10-CM

## 2023-09-05 ENCOUNTER — Ambulatory Visit: Payer: Self-pay | Admitting: Nurse Practitioner

## 2023-09-06 ENCOUNTER — Ambulatory Visit: Admitting: Psychology

## 2023-09-06 DIAGNOSIS — F3289 Other specified depressive episodes: Secondary | ICD-10-CM | POA: Diagnosis not present

## 2023-09-06 DIAGNOSIS — F418 Other specified anxiety disorders: Secondary | ICD-10-CM | POA: Diagnosis not present

## 2023-09-06 NOTE — Progress Notes (Signed)
  Behavioral Health Counselor/Therapist Progress Note  Patient ID: Patricia Boyer, MRN: [id number]   Date: 09/06/23  Time Spent: 9:04 am -  10:01 am : 57 Minutes  Treatment Type: Individual Therapy.  Reported Symptoms: Depression and Anxiety.   Mental Status Exam: Appearance:  Casual     Behavior: Appropriate  Motor: Normal  Speech/Language:  Clear and Coherent  Affect: Congruent  Mood: anxious  Thought process: normal  Thought content:   WNL  Sensory/Perceptual disturbances:   WNL  Orientation: oriented to person, place, time/date, and situation  Attention: Good  Concentration: Good  Memory: WNL  Fund of knowledge:  Good  Insight:   Good  Judgment:  Good  Impulse Control: Good   Risk Assessment: Danger to Self:  No Self-injurious Behavior: No Danger to Others: No Duty to Warn:no Physical Aggression / Violence:No  Access to Firearms a concern: No  Gang Involvement:No   Subjective:   Patricia Boyer participated from home, via video and consented to treatment. Therapist participated from home office. Patricia Boyer reviewed the events of the past week. Patricia Boyer noted some positive changes at work noted getting her own office. She noted positive feelings about this. However, she noted poor planning and implementation of the move and resources such as parking. She discussed continued work related stressors regarding dynamics and various people offloading work to one another. She noted her attempts to resolve these issues and feeling unheard despite her effort. She noted frustration regarding others reaching out to resolve an issue that she has no control over. We worked on identifying her past attempts to address these concerns. She noted anxiety and concern about her nephew who wants to go to college but whose parents are not supportive of him attending college. We worked on processing this during the session. Patricia Boyer noted a drive to help her nephew but noted worry and anxiety  regarding the ramifications of her aid. We worked on processing this during the session. We explored her options to help her nephew and to communicate her feelings. She noted continued stress related to her father's controlling nature and his attempts to insert himself in various situations with family. She noted pressure from her paternal family to attend a family reunion but noted that her family acts similarly do her father. She noted difficulty with choosing whether to attend or not. We worked on processing this and identifying her various options. Therapist validated Patricia Boyer's feelings and experience and provided supportive therapy. Therapist encouraged Patricia Boyer to work through the decision and set realistic expectations for the event. Therapist provided supportive therapy and a follow-up was scheduled for continued treatment which she benefits from.    Diagnosis:  Other specified anxiety disorders  Other depression  Intervention:  CBT Interpersonal  Treatment Plan:  Client Abilities/Strengths Patricia Boyer is intelligent, well educated, and motivated for change.   Support System: Family and friends.   Client Treatment Preferences Outpatient Therapy  Client Statement of Needs Patricia Boyer would like to work on managing day-to-day stressors, boundaries for self and others, managing rumination, work and home-life balance, identifying areas of control and lack of control, managing distress tolerance at work, reducing personalizing at work, assertiveness, focusing on positives, managing symptoms, processing past events, verbalize feelings, engaging in mindfulness, process past events, engaging in self-care, improve self-esteem, and maintain consistency.   Treatment Level Weekly  Symptoms  Depression:  loss of interest, feeling down, poor sleep, lethargy, fluctuating appetite, feeling bad about self,  and difficulty concentrating.  (Status: maintained) Anxiety: Feeling  anxious, difficulty managing  worry, worrying about different things, trouble relaxing, irritability.  (Status: maintained)  Goals:   Patricia Boyer experiences symptoms of depression and anxiety.    Target Date: 12/27/23 Frequency: Weekly  Progress: 10% Modality: individual    Therapist will provide referrals for additional resources as appropriate.  Therapist will provide psycho-education regarding Itzamar's diagnosis and corresponding treatment approaches and interventions. Licensed Clinical Social Worker, [place], LCSW will support the patient's ability to achieve the goals identified. will employ CBT, BA, Problem-solving, Solution Focused, Mindfulness,  coping skills, & other evidenced-based practices will be used to promote progress towards healthy functioning to help manage decrease symptoms associated with her diagnosis.   Reduce overall level, frequency, and intensity of the feelings of depression, anxiety and panic evidenced by decreased overall symptoms from 6 to 7 days/week to 0 to 1 days/week per client report for at least 3 consecutive months. Verbally express understanding of the relationship between feelings of depression, anxiety and their impact on thinking patterns and behaviors. Verbalize an understanding of the role that distorted thinking plays in creating fears, excessive worry, and ruminations.    [name] participated in the creation of the treatment plan)   [name], LCSW
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
## 2023-09-18 ENCOUNTER — Encounter: Payer: Self-pay | Admitting: Nurse Practitioner

## 2023-09-18 ENCOUNTER — Ambulatory Visit (INDEPENDENT_AMBULATORY_CARE_PROVIDER_SITE_OTHER): Payer: Self-pay | Admitting: Nurse Practitioner

## 2023-09-18 ENCOUNTER — Ambulatory Visit: Payer: Self-pay | Admitting: Nurse Practitioner

## 2023-09-18 VITALS — BP 140/80 | HR 106 | Temp 98.6°F | Ht 68.0 in | Wt 287.2 lb

## 2023-09-18 DIAGNOSIS — F3341 Major depressive disorder, recurrent, in partial remission: Secondary | ICD-10-CM

## 2023-09-18 DIAGNOSIS — H6121 Impacted cerumen, right ear: Secondary | ICD-10-CM | POA: Diagnosis not present

## 2023-09-18 DIAGNOSIS — I10 Essential (primary) hypertension: Secondary | ICD-10-CM | POA: Diagnosis not present

## 2023-09-18 DIAGNOSIS — Z0000 Encounter for general adult medical examination without abnormal findings: Secondary | ICD-10-CM

## 2023-09-18 DIAGNOSIS — Z8349 Family history of other endocrine, nutritional and metabolic diseases: Secondary | ICD-10-CM

## 2023-09-18 DIAGNOSIS — Z136 Encounter for screening for cardiovascular disorders: Secondary | ICD-10-CM

## 2023-09-18 DIAGNOSIS — E7800 Pure hypercholesterolemia, unspecified: Secondary | ICD-10-CM

## 2023-09-18 DIAGNOSIS — E559 Vitamin D deficiency, unspecified: Secondary | ICD-10-CM

## 2023-09-18 DIAGNOSIS — Z3A13 13 weeks gestation of pregnancy: Secondary | ICD-10-CM

## 2023-09-18 DIAGNOSIS — Z6841 Body Mass Index (BMI) 40.0 and over, adult: Secondary | ICD-10-CM

## 2023-09-18 DIAGNOSIS — Z13220 Encounter for screening for lipoid disorders: Secondary | ICD-10-CM

## 2023-09-18 DIAGNOSIS — E66813 Obesity, class 3: Secondary | ICD-10-CM

## 2023-09-18 LAB — POCT URINALYSIS DIP (CLINITEK)
Bilirubin, UA: NEGATIVE
Blood, UA: NEGATIVE
Glucose, UA: NEGATIVE mg/dL
Leukocytes, UA: NEGATIVE
Nitrite, UA: NEGATIVE
POC PROTEIN,UA: 30 — AB
Spec Grav, UA: >=1.03 — AB (ref 1.010–1.025)
Urobilinogen, UA: 0.2 U/dL
pH, UA: 5.5 (ref 5.0–8.0)

## 2023-09-18 NOTE — Progress Notes (Signed)
 LILLETTE Kristeen [name] Gladis, CMA,acting as a Neurosurgeon for Patricia Ada, FNP.,have documented all relevant documentation on the behalf of Patricia Ada, FNP,as directed by  Patricia Ada, FNP while in the presence of Patricia Ada, FNP.  Subjective:    Patient ID: Patricia Boyer , female    DOB: February 25, 1980 , 43 y.o.   MRN: [id number]  Chief Complaint  Patient presents with   Annual Exam    Patient presents today for HM, Patient reports compliance with medication. Patient denies any chest pain, SOB, or headaches. Patient has no concerns today.     HPI  Patient presents for annual physical.  She is pregnant, had an episode of vaginal bleeding in July. She was seen in urgent care and evaluated, on ultrasound there was a cyst and fibroid found in uterus.  No other recent acute issues.    Blood pressure elevated, states that it was elevated earlier today at dentist appointment. At times she has a headache but when she checks her blood pressure it is in normal limits.  She does not have symptoms when her BP is elevated.    She has had decreased appetite since pregnancy, she has made adjustments in what she is eating as now eats smaller meals throughout the day.    Feels that her mood has been good recently, is aware that she has had depression in the past and does want this exacerbated in pregnancy.  Dr Rutherford placed a referral to a mood clinic where Zoloft was suggested for depression management.  Patient stated that she wanted to wait until after this physical appointment to start the medication to ensure that it was not a medication she had tried in the past.    She is currently working out 3 times a week doing HITT class, making modifications.  She is drinking four 16 oz bottles of water per day.    She is not sleeping well. Has issues with staying asleep, wakes at 3 am and unable to fall asleep.  She is napping after work consistently.        Past Medical History:  Diagnosis Date   Anemia    Anxiety     Arthritis    Cataract    Depression    GERD (gastroesophageal reflux disease)      Family History  Problem Relation Age of Onset   Hypertension Mother    Sleep apnea Mother    Arthritis Father    Vision loss Father    Diabetes Maternal Grandmother    Diabetes Maternal Uncle      Current Outpatient Medications:    folic acid  (FOLVITE ) 1 MG tablet, Take 1 tablet (1 mg total) by mouth daily., Disp: 100 tablet, Rfl: 3   labetalol  (NORMODYNE ) 100 MG tablet, Take 1 tablet (100 mg total) by mouth 2 (two) times daily., Disp: 90 tablet, Rfl: 1   NIFEdipine  (PROCARDIA  XL) 30 MG 24 hr tablet, Take 1 tablet (30 mg total) by mouth daily., Disp: 30 tablet, Rfl: 11   Prenatal Vit-Fe Fumarate-FA (MULTIVITAMIN-PRENATAL) 27-0.8 MG TABS tablet, TAKE 1 TABLET BY MOUTH DAILY AT 12 NOON., Disp: 90 tablet, Rfl: 1   No Known Allergies    The patient states she is currently [redacted] weeks pregnant. Patient's last menstrual period was 06/15/2023.. Negative for Dysmenorrhea and Negative for Menorrhagia. Negative for: breast discharge, breast lump(s), breast pain and breast self exam. Associated symptoms include abnormal vaginal bleeding. Pertinent negatives include abnormal bleeding (hematology), anxiety, decreased libido, depression, dyspareunia, history  of infertility, nocturia, sexual dysfunction, urinary incontinence, urinary urgency, vaginal discharge and vaginal itching.   . The patient's tobacco use is:  Social History   Tobacco Use  Smoking Status Never  Smokeless Tobacco Never  . She has been exposed to passive smoke. The patient's alcohol use is:  Social History   Substance and Sexual Activity  Alcohol Use Not Currently   Alcohol/week: 1.0 standard drink of alcohol   Additional information: Last pap 08/21/2021, next one scheduled for 08/21/2024.    Review of Systems  Constitutional:  Positive for appetite change (decreased appetite) and fatigue (pregnancy). Negative for chills and fever.  HENT:   Negative for congestion and sinus pressure.   Respiratory:  Negative for cough, shortness of breath and wheezing.   Cardiovascular:  Negative for chest pain, palpitations and leg swelling.  Gastrointestinal:  Positive for constipation. Negative for abdominal pain, diarrhea, nausea and vomiting.  Endocrine: Positive for polyuria. Negative for polydipsia and polyphagia.  Genitourinary:  Positive for frequency. Negative for dysuria and urgency.  Skin:  Negative for rash and wound.  Neurological:  Positive for headaches (with low blood presssure). Negative for dizziness, tremors and weakness.     Today's Vitals   09/18/23 1431  BP: (!) 140/80  Pulse: (!) 106  Temp: 98.6 F (37 C)  TempSrc: Oral  Weight: 287 lb 3.2 oz (130.3 kg)  Height: 5' 8 (1.727 m)  PainSc: 0-No pain   Body mass index is 43.67 kg/m.  Wt Readings from Last 3 Encounters:  09/18/23 287 lb 3.2 oz (130.3 kg)  08/24/23 286 lb (129.7 kg)  07/31/23 291 lb 12.8 oz (132.4 kg)     Objective:  Physical Exam Constitutional:      General: She is not in acute distress.    Appearance: Normal appearance. She is obese.  HENT:     Head: Normocephalic and atraumatic.     Right Ear: Ear canal and external ear normal. There is impacted cerumen.     Left Ear: Tympanic membrane, ear canal and external ear normal.     Nose: Nose normal. No congestion or rhinorrhea.     Mouth/Throat:     Mouth: Mucous membranes are moist.     Pharynx: Oropharynx is clear.  Neck:     Vascular: No carotid bruit.  Cardiovascular:     Rate and Rhythm: Normal rate and regular rhythm.     Pulses: Normal pulses.     Heart sounds: Normal heart sounds.  Pulmonary:     Effort: Pulmonary effort is normal.     Breath sounds: Normal breath sounds.  Musculoskeletal:     Cervical back: Normal range of motion and neck supple.     Right lower leg: No edema.     Left lower leg: No edema.  Lymphadenopathy:     Cervical: No cervical adenopathy.  Skin:     General: Skin is warm and dry.     Capillary Refill: Capillary refill takes less than 2 seconds.  Neurological:     General: No focal deficit present.     Mental Status: She is alert and oriented to person, place, and time.  Psychiatric:        Mood and Affect: Mood normal.        Behavior: Behavior normal.        Thought Content: Thought content normal.        Judgment: Judgment normal.         Assessment And Plan:  Encounter for annual health examination  Recurrent major depressive disorder, in partial remission [facility]) Assessment & Plan: Referral to mood center placed, zoloft recommend to manage depression during pregnancy.  Patient to follow up next week for medication initiation at mood clinic.     Family history of thyroid disease -     TSH  Encounter for lipid screening for cardiovascular disease -     Lipid panel  Uncontrolled hypertension Assessment & Plan: Uncontrolled, blood pressure remains elevated on current medications.  EKG sinus rhythm with PVC's rate of 70.  GYN to manage blood pressure during pregnancy  Orders: -     POCT URINALYSIS DIP (CLINITEK) -     Microalbumin / creatinine urine ratio -     CMP14+EGFR -     EKG 12-Lead  Elevated cholesterol Assessment & Plan: Cholesterol levels are normal. Continue focusing on low fat diet.    Vitamin D  deficiency Assessment & Plan: Will check vitamin D  level and supplement as needed.    Also encouraged to spend 15 minutes in the sun daily.     [redacted] weeks gestation of pregnancy Assessment & Plan: Continue f/u with OB/GYN  Orders: -     CBC with Differential/Platelet  Class 3 severe obesity due to excess calories without serious comorbidity with body mass index (BMI) of 40.0 to 44.9 in adult Assessment & Plan: Encourage to maintain adequate nutrition during pregnancy, eating a variety of fruits and vegetables, protein fiber and healthy fats. Encouraged to maintain physical activity as tolerated,  currently [redacted] weeks pregnant.  Orders: -     Hemoglobin A1c  Impacted cerumen of right ear Assessment & Plan: Wax is removed by with lavage with elephant ear with 1/2 water and 1/2 peroxide. Instructions for home care to prevent wax buildup are given.   Orders: -     Ear Lavage     Return for 1 year physical, uncontrolled bp 3-80months check. Patient was given opportunity to ask questions. Patient verbalized understanding of the plan and was able to repeat key elements of the plan. All questions were answered to their satisfaction.   Patricia Ada, FNP  I have reviewed this encounter including the documentation in this note and/or discussed this patient with Delon Louder, FNP Student. I am certifying that I agree with the content of this note as the primary care nurse practitioner.  Patricia Ada, DNP, FNP-BC  I, Patricia Ada, FNP, have reviewed all documentation for this visit. The documentation on 09/18/23 for the exam, diagnosis, procedures, and orders are all accurate and complete.

## 2023-09-18 NOTE — Assessment & Plan Note (Addendum)
 Encourage to maintain adequate nutrition during pregnancy, eating a variety of fruits and vegetables, protein fiber and healthy fats. Encouraged to maintain physical activity as tolerated, currently [redacted] weeks pregnant.

## 2023-09-18 NOTE — Assessment & Plan Note (Signed)
 Uncontrolled, blood pressure remains elevated on current medications.  EKG sinus rhythm with PVC's rate of 70.  GYN to manage blood pressure during pregnancy

## 2023-09-18 NOTE — Assessment & Plan Note (Signed)
 Referral to [facility] placed, zoloft recommend to manage depression during pregnancy.  Patient to follow up next week for medication initiation at mood clinic.

## 2023-09-19 LAB — HEMOGLOBIN A1C
Est. average glucose Bld gHb Est-mCnc: 100 mg/dL
Hgb A1c MFr Bld: 5.1 % (ref 4.8–5.6)

## 2023-09-19 LAB — TSH: TSH: 2.4 u[IU]/mL (ref 0.450–4.500)

## 2023-09-19 LAB — CBC WITH DIFFERENTIAL/PLATELET
Basophils Absolute: 0 x10E3/uL (ref 0.0–0.2)
Basos: 0 %
EOS (ABSOLUTE): 0.1 x10E3/uL (ref 0.0–0.4)
Eos: 1 %
Hematocrit: 42.4 % (ref 34.0–46.6)
Hemoglobin: 14.2 g/dL (ref 11.1–15.9)
Immature Grans (Abs): 0 x10E3/uL (ref 0.0–0.1)
Immature Granulocytes: 0 %
Lymphocytes Absolute: 1.5 x10E3/uL (ref 0.7–3.1)
Lymphs: 16 %
MCH: 30.8 pg (ref 26.6–33.0)
MCHC: 33.5 g/dL (ref 31.5–35.7)
MCV: 92 fL (ref 79–97)
Monocytes Absolute: 0.8 x10E3/uL (ref 0.1–0.9)
Monocytes: 9 %
Neutrophils Absolute: 6.8 x10E3/uL (ref 1.4–7.0)
Neutrophils: 74 %
Platelets: 294 x10E3/uL (ref 150–450)
RBC: 4.61 x10E6/uL (ref 3.77–5.28)
RDW: 14.8 % (ref 11.7–15.4)
WBC: 9.2 x10E3/uL (ref 3.4–10.8)

## 2023-09-19 LAB — LIPID PANEL
Chol/HDL Ratio: 2.7 ratio (ref 0.0–4.4)
Cholesterol, Total: 200 mg/dL — ABNORMAL HIGH (ref 100–199)
HDL: 75 mg/dL (ref >39)
LDL Chol Calc (NIH): 112 mg/dL — ABNORMAL HIGH (ref 0–99)
Triglycerides: 72 mg/dL (ref 0–149)
VLDL Cholesterol Cal: 13 mg/dL (ref 5–40)

## 2023-09-19 LAB — CMP14+EGFR
ALT: 23 IU/L (ref 0–32)
AST: 23 IU/L (ref 0–40)
Albumin: 4.2 g/dL (ref 3.9–4.9)
Alkaline Phosphatase: 82 IU/L (ref 44–121)
BUN/Creatinine Ratio: 12 (ref 9–23)
BUN: 9 mg/dL (ref 6–24)
Bilirubin Total: 0.5 mg/dL (ref 0.0–1.2)
CO2: 21 mmol/L (ref 20–29)
Calcium: 9.8 mg/dL (ref 8.7–10.2)
Chloride: 102 mmol/L (ref 96–106)
Creatinine, Ser: 0.74 mg/dL (ref 0.57–1.00)
Globulin, Total: 3 g/dL (ref 1.5–4.5)
Glucose: 71 mg/dL (ref 70–99)
Potassium: 4.1 mmol/L (ref 3.5–5.2)
Sodium: 136 mmol/L (ref 134–144)
Total Protein: 7.2 g/dL (ref 6.0–8.5)
eGFR: 104 mL/min/1.73 (ref >59)

## 2023-09-19 LAB — MICROALBUMIN / CREATININE URINE RATIO
Creatinine, Urine: 255.2 mg/dL
Microalb/Creat Ratio: 8 mg/g{creat} (ref 0–29)
Microalbumin, Urine: 19.6 ug/mL

## 2023-09-25 ENCOUNTER — Ambulatory Visit: Admitting: Psychology

## 2023-09-25 DIAGNOSIS — F3289 Other specified depressive episodes: Secondary | ICD-10-CM | POA: Diagnosis not present

## 2023-09-25 DIAGNOSIS — F418 Other specified anxiety disorders: Secondary | ICD-10-CM | POA: Diagnosis not present

## 2023-09-25 NOTE — Progress Notes (Signed)
 [facility] Counselor/Therapist Progress Note  Patient ID: Patricia Boyer, MRN: [id number]   Date: 09/25/23  Time Spent: 3:04 pm -  4:04 pm : 60 Minutes  Treatment Type: Individual Therapy.  Reported Symptoms: Depression and Anxiety.   Mental Status Exam: Appearance:  Casual     Behavior: Appropriate  Motor: Normal  Speech/Language:  Clear and Coherent  Affect: Congruent  Mood: anxious  Thought process: normal  Thought content:   WNL  Sensory/Perceptual disturbances:   WNL  Orientation: oriented to person, place, time/date, and situation  Attention: Good  Concentration: Good  Memory: WNL  Fund of knowledge:  Good  Insight:   Good  Judgment:  Good  Impulse Control: Good   Risk Assessment: Danger to Self:  No Self-injurious Behavior: No Danger to Others: No Duty to Warn:no Physical Aggression / Violence:No  Access to Firearms a concern: No  Gang Involvement:No   Subjective:   Patricia Boyer participated from car, via video and consented to treatment. Therapist participated from home office. Patricia Boyer reviewed the events of the past week. Patricia Boyer noted feeling discomfort, at work, due to being placed with the department she was recently not placed with. She noted group dynamics playing a part in her current stressors. 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 She noted I wasn't expecting to feel the way I feel. W worked on processing this during the session. She noted this being a reminder of past experience and noted that people will not take responsibility for how she was treated. We worked on exploring this and possible contributing factors to how she feels. Therapist validated Patricia Boyer's feelings and experience. We worked on exploring ways to manage this experience via self-talk, reframing, reducing focus in areas of stress and no control, identifying areas of control. Therapist modeled this during the  session. We discussed ways to focus on self-care, boundaries for self and others, investing focus in areas of meaning & value.Therapist provided psychoeducation regarding rumination and the effects on mood. Therapist validated Patricia Boyer's feelings and experience and provided supportive therapy. A follow-up was scheduled for continued treatment which Patricia Boyer benefits from.   Diagnosis:  Other specified anxiety disorders  Other depression  Intervention:  CBT Interpersonal  Treatment Plan:  Client Abilities/Strengths Patricia Boyer is intelligent, well educated, and motivated for change.   Support System: Family and friends.   Client Treatment Preferences Outpatient Therapy  Client Statement of Needs Patricia Boyer would like to work on managing day-to-day stressors, boundaries for self and others, managing rumination, work and home-life balance, identifying areas of control and lack of control, managing distress tolerance at work, reducing personalizing at work, assertiveness, focusing on positives, managing symptoms, processing past events, verbalize feelings, engaging in mindfulness, process past events, engaging in self-care, improve self-esteem, and maintain consistency.   Treatment Level Weekly  Symptoms  Depression:  loss of interest, feeling down, poor sleep, lethargy, fluctuating appetite, feeling bad about self,  and difficulty concentrating.  (Status: maintained) Anxiety: Feeling anxious, difficulty managing worry, worrying about different things, trouble relaxing, irritability.  (Status: maintained)  Goals:   Patricia Boyer experiences symptoms of depression and anxiety.    Target Date: 12/27/23 Frequency: Weekly  Progress: 10% Modality: individual    Therapist will provide referrals for additional resources as appropriate.  Therapist will provide psycho-education regarding Patricia Boyer's diagnosis and corresponding treatment approaches and interventions. Licensed Clinical Social Worker, [place], LCSW will support the patient's ability to achieve the goals identified.  will employ CBT, BA, Problem-solving, Solution Focused, Mindfulness,  coping skills, & other evidenced-based practices will be used to promote progress towards healthy functioning to help manage decrease symptoms associated with her diagnosis.   Reduce overall level, frequency, and intensity of the feelings of depression, anxiety and panic evidenced by decreased overall symptoms from 6 to 7 days/week to 0 to 1 days/week per client report for at least 3 consecutive months. Verbally express understanding of the relationship between feelings of depression, anxiety and their impact on thinking patterns and behaviors. Verbalize an understanding of the role that distorted thinking plays in creating fears, excessive worry, and ruminations.    Patricia Boyer participated in the creation of the treatment plan)   [name], LCSW

## 2023-09-29 ENCOUNTER — Other Ambulatory Visit: Payer: Self-pay | Admitting: Nurse Practitioner

## 2023-09-29 DIAGNOSIS — Z3201 Encounter for pregnancy test, result positive: Secondary | ICD-10-CM

## 2023-10-06 DIAGNOSIS — Z3A13 13 weeks gestation of pregnancy: Secondary | ICD-10-CM | POA: Insufficient documentation

## 2023-10-06 DIAGNOSIS — E7800 Pure hypercholesterolemia, unspecified: Secondary | ICD-10-CM | POA: Insufficient documentation

## 2023-10-06 NOTE — Assessment & Plan Note (Signed)
 Will check vitamin D  level and supplement as needed.    Also encouraged to spend 15 minutes in the sun daily.

## 2023-10-06 NOTE — Assessment & Plan Note (Signed)
 Continue f/u with OB/GYN

## 2023-10-06 NOTE — Assessment & Plan Note (Signed)
Cholesterol levels are normal. Continue focusing on low fat diet

## 2023-10-06 NOTE — Assessment & Plan Note (Signed)
 Wax is removed by with lavage with elephant ear with 1/2 water and 1/2 peroxide. Instructions for home care to prevent wax buildup are given.

## 2023-10-18 ENCOUNTER — Ambulatory Visit: Admitting: Psychology

## 2023-10-18 DIAGNOSIS — F3289 Other specified depressive episodes: Secondary | ICD-10-CM

## 2023-10-18 DIAGNOSIS — F418 Other specified anxiety disorders: Secondary | ICD-10-CM

## 2023-10-18 NOTE — Progress Notes (Signed)
 [facility] Counselor/Therapist Progress Note  Patient ID: Patricia Boyer, MRN: [id number]   Date: 10/18/23  Time Spent: 8:07 am -  8:50 am : 43 Minutes  Treatment Type: Individual Therapy.  Reported Symptoms: Depression and Anxiety.   Mental Status Exam: Appearance:  Casual     Behavior: Appropriate  Motor: Normal  Speech/Language:  Clear and Coherent  Affect: Congruent  Mood: anxious  Thought process: normal  Thought content:   WNL  Sensory/Perceptual disturbances:   WNL  Orientation: oriented to person, place, time/date, and situation  Attention: Good  Concentration: Good  Memory: WNL  Fund of knowledge:  Good  Insight:   Good  Judgment:  Good  Impulse Control: Good  She note Risk Assessment: Danger to Self:  No Self-injurious Behavior: No Danger to Others: No Duty to Warn:no Physical Aggression / Violence:No  Access to Firearms a concern: No  Gang Involvement:No   Subjective:   Patricia Boyer participated from home, via video and consented to treatment. Therapist participated from home office. Patricia Boyer reviewed the events of the past week. Patricia Boyer noted being anxious to tell her father about her pregnancy but noted worry about his response, tone, and attitude. She noted that her father has a plan for me, one I know nothing about. She noted her past efforts to set boundaries with her father and noted this being largely ineffectual. She noted often realizing that she is being manipulated by him as they interact. She described her father as being punitive. She noted feeling a sense of ownership regarding her father's responses and reactions. She noted feeling in limbo and discussed how her childhood experiences have affected their dynamics. She noted her worry that her mother will be on the receiving end of Patricia Boyer's father's displeasure and frustration. She noted taking responsibility for other people people's behavior (Father) since childhood. We worked on  exploring this and the effect of this dynamic on her mood and level of distress. Therapist encouraged Patricia Boyer to work on identifying possible boundaries going forward for her father and identify ways she would like to manage her distress. We will work on processing this during our follow-up. Therapist validated Patricia Boyer's feelings and experience and provided supportive therapy. A follow-up was scheduled for continued treatment, which she benefits from.   Diagnosis:  Other specified anxiety disorders  Other depression  Intervention:  CBT Interpersonal  Treatment Plan:  Client Abilities/Strengths Patricia Boyer is intelligent, well educated, and motivated for change.   Support System: Family and friends.   Client Treatment Preferences Outpatient Therapy  Client Statement of Needs Patricia Boyer would like to work on managing day-to-day stressors, boundaries for self and others, managing rumination, work and home-life balance, identifying areas of control and lack of control, managing distress tolerance at work, reducing personalizing at work, assertiveness, focusing on positives, managing symptoms, processing past events, verbalize feelings, engaging in mindfulness, process past events, engaging in self-care, improve self-esteem, and maintain consistency.   Treatment Level Weekly  Symptoms  Depression:  loss of interest, feeling down, poor sleep, lethargy, fluctuating appetite, feeling bad about self,  and difficulty concentrating.  (Status: maintained) Anxiety: Feeling anxious, difficulty managing worry, worrying about different things, trouble relaxing, irritability.  (Status: maintained)  Goals:   Patricia Boyer experiences symptoms of depression and anxiety.    Target Date: 12/27/23 Frequency: Weekly  Progress: 10% Modality: individual    Therapist will provide referrals for additional resources as appropriate.  Therapist will provide psycho-education regarding Patricia Boyer's diagnosis and corresponding  treatment approaches  and interventions. Licensed Clinical Social Worker, [place], LCSW will support the patient's ability to achieve the goals identified. will employ CBT, BA, Problem-solving, Solution Focused, Mindfulness,  coping skills, & other evidenced-based practices will be used to promote progress towards healthy functioning to help manage decrease symptoms associated with her diagnosis.   Reduce overall level, frequency, and intensity of the feelings of depression, anxiety and panic evidenced by decreased overall symptoms from 6 to 7 days/week to 0 to 1 days/week per client report for at least 3 consecutive months. Verbally express understanding of the relationship between feelings of depression, anxiety and their impact on thinking patterns and behaviors. Verbalize an understanding of the role that distorted thinking plays in creating fears, excessive worry, and ruminations.    Patricia Boyer participated in the creation of the treatment plan)   Patricia Mullet, LCSW

## 2023-10-25 ENCOUNTER — Encounter: Payer: Self-pay | Admitting: Cardiology

## 2023-10-25 ENCOUNTER — Ambulatory Visit: Admitting: Cardiology

## 2023-10-25 VITALS — BP 132/86 | HR 87 | Ht 68.0 in | Wt 287.3 lb

## 2023-10-25 DIAGNOSIS — I10 Essential (primary) hypertension: Secondary | ICD-10-CM | POA: Diagnosis not present

## 2023-10-25 DIAGNOSIS — I119 Hypertensive heart disease without heart failure: Secondary | ICD-10-CM

## 2023-10-25 NOTE — Patient Instructions (Addendum)
 Medication Instructions:  Your physician recommends that you continue on your current medications as directed. Please refer to the Current Medication list given to you today.  *If you need a refill on your cardiac medications before your next appointment, please call your pharmacy*   Testing/Procedures: Your physician has requested that you have an echocardiogram-OB. Echocardiography is a painless test that uses sound waves to create images of your heart. It provides your doctor with information about the size and shape of your heart and how well your heart's chambers and valves are working. This procedure takes approximately one hour. There are no restrictions for this procedure. Please do NOT wear cologne, perfume, aftershave, or lotions (deodorant is allowed). Please arrive 15 minutes prior to your appointment time.  Please note: We ask at that you not bring children with you during ultrasound (echo/ vascular) testing. Due to room size and safety concerns, children are not allowed in the ultrasound rooms during exams. Our front office staff cannot provide observation of children in our lobby area while testing is being conducted. An adult accompanying a patient to their appointment will only be allowed in the ultrasound room at the discretion of the ultrasound technician under special circumstances. We apologize for any inconvenience.   Follow-Up: At [facility], you and your health needs are our priority.  As part of our continuing mission to provide you with exceptional heart care, our providers are all part of one team.  This team includes your primary Cardiologist (physician) and Advanced Practice Providers or APPs (Physician Assistants and Nurse Practitioners) who all work together to provide you with the care you need, when you need it.  Your next appointment:   12 week(s) Mag or [facility]  Provider:   Kardie Tobb, DO    Other Instructions Please take your blood pressure daily for  2 weeks and send in a MyChart message. Please include heart rates. (One message at the end of the 2 weeks).   HOW TO TAKE YOUR BLOOD PRESSURE: Rest 5 minutes before taking your blood pressure. Don't smoke or drink caffeinated beverages for at least 30 minutes before. Take your blood pressure before (not after) you eat. Sit comfortably with your back supported and both feet on the floor (don't cross your legs). Elevate your arm to heart level on a table or a desk. Use the proper sized cuff. It should fit smoothly and snugly around your bare upper arm. There should be enough room to slip a fingertip under the cuff. The bottom edge of the cuff should be 1 inch above the crease of the elbow. Ideally, take 3 measurements at one sitting and record the average.

## 2023-10-27 NOTE — Progress Notes (Signed)
 [facility]io-Obstetrics Clinic  New Evaluation  Date:  10/27/2023   ID:  Patricia Boyer, DOB 17-Jul-1980, MRN [id number]  PCP:  Georgina Speaks, FNP   [facility] Providers Cardiologist:  Dub Huntsman, DO  Electrophysiologist:  None       Referring MD: Rutherford Gain, MD   Chief Complaint:  I am   History of Present Illness:    Patricia Boyer is a 43 y.o. female [G3P0020] who is being seen today for the evaluation of chronic hypertension at the request of Rutherford Gain, MD.   Medical history includes hypertension who presents for blood pressure management during pregnancy. She was referred by Dr. Rutherford.  She has a two-year history of hypertension with fluctuating blood pressure readings. Her current regimen includes nifedipine  30 mg and labetalol  100 mg twice daily, which has stabilized her blood pressure over the past month and a half. She monitors her blood pressure at home, noting recent stable readings, with previous levels around 150 mmHg. She is also taking baby aspirin once daily.  There is a family history of thyroid disease in her father and sister, high cholesterol in her father, and high blood pressure in her mother and maternal relatives.  She denies significant shortness of breath.   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   labetalol   (NORMODYNE ) 100 MG tablet Take 1 tablet (100 mg total) by mouth 2 (two) times daily.   NIFEdipine  (PROCARDIA  XL) 30 MG 24 hr tablet Take 1 tablet (30 mg total) by mouth daily.   Prenatal Vit-Fe Fumarate-FA (MULTIVITAMIN-PRENATAL) 27-0.8 MG TABS tablet TAKE 1 TABLET BY MOUTH DAILY AT 12 NOON.     Allergies:   Patient has no known allergies.   Social History   Socioeconomic History   Marital status: Single    Spouse name: Not on file   Number of children: Not on file   Years of education: Not on file   Highest education level: Doctorate  Occupational History   Not on file  Tobacco Use   Smoking status: Never   Smokeless tobacco: Never  Substance and Sexual Activity   Alcohol use: Not Currently    Alcohol/week: 1.0 standard drink of alcohol   Drug use: No   Sexual activity: Yes    Birth control/protection: Condom  Other Topics Concern   Not on file  Social History Narrative   Lives alone   Right handed   Caffeine : 1.5 cups/day   Social  Drivers of Health   Financial Resource Strain: Medium Risk (07/30/2023)   Overall Financial Resource Strain (CARDIA)    Difficulty of Paying Living Expenses: Somewhat hard  Food Insecurity: No Food Insecurity (07/30/2023)   Hunger Vital Sign    Worried About Running Out of Food in the Last Year: Never true    Ran Out of Food in the Last Year: Never true  Transportation Needs: No Transportation Needs (07/30/2023)   PRAPARE - Administrator, Civil Service (Medical): No    Lack of Transportation (Non-Medical): No  Physical Activity: Insufficiently Active (07/30/2023)   Exercise Vital Sign    Days of Exercise per Week: 3 days    Minutes of Exercise per Session: 30 min  Stress: Stress Concern Present (07/30/2023)   [employer] of Occupational Health - Occupational Stress Questionnaire    Feeling of Stress: To some extent  Social Connections: Socially Integrated (07/30/2023)   Social Connection and Isolation Panel    Frequency  of Communication with Friends and Family: More than three times a week    Frequency of Social Gatherings with Friends and Family: Once a week    Attends Religious Services: 1 to 4 times per year    Active Member of [employer] or Organizations: Yes    Attends Engineer, structural: More than 4 times per year    Marital Status: Living with partner      Family History  Problem Relation Age of Onset   Hypertension Mother    Sleep apnea Mother    Arthritis Father    Vision loss Father    Diabetes Maternal Grandmother    Diabetes Maternal Uncle       ROS:   Please see the history of present illness.     All other systems reviewed and are negative.   Labs/EKG Reviewed:    EKG:  None today   Recent Labs: 09/18/2023: ALT 23; BUN 9; Creatinine, Ser 0.74; Hemoglobin 14.2; Platelets 294; Potassium 4.1; Sodium 136; TSH 2.400   Recent Lipid Panel Lab Results  Component Value Date/Time   CHOL 200 (H) 09/18/2023 04:03 PM   TRIG 72 09/18/2023 04:03 PM   HDL 75 09/18/2023 04:03 PM   CHOLHDL 2.7 09/18/2023 04:03 PM   LDLCALC 112 (H) 09/18/2023 04:03 PM    Physical Exam:    VS:  BP 132/86   Pulse 87   Ht 5' 8 (1.727 m)   Wt 287 lb 4.8 oz (130.3 kg)   LMP 06/15/2023   SpO2 100%   BMI 43.68 kg/m     Wt Readings from Last 3 Encounters:  10/25/23 287 lb 4.8 oz (130.3 kg)  09/18/23 287 lb 3.2 oz (130.3 kg)  08/24/23 286 lb (129.7 kg)     GEN:  Well nourished, well developed in no acute distress HEENT: Normal NECK: No JVD; No carotid bruits LYMPHATICS: No lymphadenopathy CARDIAC: RRR, no murmurs, rubs, gallops RESPIRATORY:  Clear to auscultation without rales, wheezing or rhonchi  ABDOMEN: Soft, non-tender, non-distended MUSCULOSKELETAL:  No edema; No deformity  SKIN: Warm and dry NEUROLOGIC:  Alert and oriented x 3 PSYCHIATRIC:  Normal affect    Risk Assessment/Risk Calculators:     CARPREG II Risk Prediction Index Score:  1.  The patient's risk for a primary  cardiac event is 5%.   Modified World Health Organization [facility]) Classification of Maternal CV Risk   Class I         ASSESSMENT & PLAN:  Chronic hypertension complicating pregnancy Chronic hypertension for two years, complicating pregnancy. Blood pressure managed with nifedipine  and labetalol , currently 132/86 mmHg. No significant shortness of breath. No prior cardiac ultrasound. - Monitor blood pressure daily and report via MyChart after two weeks. - Order cardiac ultrasound to assess hypertensive impact on heart walls. - Follow up with pharmacist [place] in six weeks and with doctor in twelve weeks. - Bring blood pressure cuff to appointment with [place]. - Continue baby aspirin once daily. - Notify if blood pressure reaches 140/90 mmHg on two occasions.  Vitamin D  deficiency Low vitamin D  levels for several years. Last checked in 2024, still low. Current prenatal vitamins may lack vitamin D . - Check prenatal vitamins for vitamin D  content. - If lacking, take over-the-counter vitamin D  supplements.   Patient Instructions  Medication Instructions:  Your physician recommends that you continue on your current medications as directed. Please refer to the Current Medication list given to you today.  *If you need a refill on your cardiac medications before your next appointment, please call your pharmacy*   Testing/Procedures: Your physician has requested that you have an echocardiogram-OB. Echocardiography is a painless test that uses sound waves to create images of your heart. It provides your doctor with information about the size and shape of your heart and how well your heart's chambers and valves are working. This procedure takes approximately one hour. There are no restrictions for this procedure. Please do NOT wear cologne, perfume, aftershave, or lotions (deodorant is allowed). Please arrive 15 minutes prior to your appointment time.  Please note: We ask at that you not bring  children with you during ultrasound (echo/ vascular) testing. Due to room size and safety concerns, children are not allowed in the ultrasound rooms during exams. Our front office staff cannot provide observation of children in our lobby area while testing is being conducted. An adult accompanying a patient to their appointment will only be allowed in the ultrasound room at the discretion of the ultrasound technician under special circumstances. We apologize for any inconvenience.   Follow-Up: At [facility], you and your health needs are our priority.  As part of our continuing mission to provide you with exceptional heart care, our providers are all part of one team.  This team includes your primary Cardiologist (physician) and Advanced Practice Providers or APPs (Physician Assistants and Nurse Practitioners) who all work together to provide you with the care you need, when you need it.  Your next appointment:   12 week(s) Mag or [facility]  Provider:   Hafsah Hendler, DO    Other Instructions Please take your blood pressure daily for 2 weeks and send in a MyChart message. Please include heart rates. (One message at the end of the 2 weeks).   HOW TO TAKE YOUR BLOOD PRESSURE: Rest 5 minutes before taking your blood pressure. Don't smoke or drink caffeinated beverages for at least 30 minutes before. Take your blood pressure before (not after) you eat. Sit comfortably with your back supported and both feet on the floor (don't cross your legs). Elevate your arm to heart level on a table or a desk. Use the proper sized cuff. It should fit smoothly and snugly around your bare upper arm. There should be enough room to slip a fingertip under the cuff. The bottom edge of the cuff should be 1 inch above the crease of the elbow. Ideally, take 3 measurements at one sitting and record the average.  Dispo:  No follow-ups on file.   Medication Adjustments/Labs and Tests Ordered: Current  medicines are reviewed at length with the patient today.  Concerns regarding medicines are outlined above.  Tests Ordered: Orders Placed This Encounter  Procedures   AMB Referral to [facility] Pharm[facility]   ECHOCARDIOGRAM COMPLETE   Medication Changes: No orders of the defined types were placed in this encounter.

## 2023-11-15 ENCOUNTER — Ambulatory Visit: Admitting: Psychology

## 2023-11-15 DIAGNOSIS — F418 Other specified anxiety disorders: Secondary | ICD-10-CM | POA: Diagnosis not present

## 2023-11-15 DIAGNOSIS — F3289 Other specified depressive episodes: Secondary | ICD-10-CM | POA: Diagnosis not present

## 2023-11-15 NOTE — Progress Notes (Signed)
 [facility] Counselor/Therapist Progress Note  Patient ID: Patricia Boyer, MRN: [id number]   Date: 11/15/23  Time Spent: 8:07 am -  9:00 am : 53 Minutes  Treatment Type: Individual Therapy.  Reported Symptoms: Depression and Anxiety.   Mental Status Exam: Appearance:  Casual     Behavior: Appropriate  Motor: Normal  Speech/Language:  Clear and Coherent  Affect: Congruent  Mood: anxious  Thought process: normal  Thought content:   WNL  Sensory/Perceptual disturbances:   WNL  Orientation: oriented to person, place, time/date, and situation  Attention: Good  Concentration: Good  Memory: WNL  Fund of knowledge:  Good  Insight:   Good  Judgment:  Good  Impulse Control: Good    Risk Assessment: Danger to Self:  No Self-injurious Behavior: No Danger to Others: No Duty to Warn:no Physical Aggression / Violence:No  Access to Firearms a concern: No  Gang Involvement:No   Subjective:   Patricia Boyer participated from home, via video and consented to treatment. Therapist participated from home office. Patricia Boyer reviewed the events of the past week. She noted work related stressors related to who to take directives from and noted no structure. She noted being assigned tasks, attempted to follow up, and being dismissed or redirected. She noted a sense of confusion and feeling overwhelmed. She noted a lack of autonomy or making executive decisions. She noted her attempts to advocate for positive pragmatic changes but noted being met with various barriers. She noted receiving mixed messages and noted a general lack of communication. She noted how this affects her mood while at work, which included a sense of anxiety and uncertainty. She noted feeling like she is being assigned busywork. 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  She noted that  work often espouses focusing on customer services while the work culture is antithetical to this. She described a culture that is stressful. We worked on processing this and her current coping strategies. She noted that a job change, in this climate, would be difficult to manage and discussed her efforts to manages these various stressors. She noted informing her father of the pregnancy and noted this going more positively than expected. She noted her BP often spiking and having to monitor this closely, noting anxiety regarding this. We worked on exploring her work related and health related stressors during the session. Therapist validated Patricia Boyer's feelings and experience during the session while praising her effort to manage her stressors proactively. Therapist provided supportive therapy. A follow-up was scheduled for continued treatment, which she benefits from.    Diagnosis:  Other specified anxiety disorders  Other depression  Intervention:  CBT Interpersonal  Treatment Plan:  Client Abilities/Strengths Patricia Boyer is intelligent, well educated, and motivated for change.   Support System: Family and friends.   Client Treatment Preferences Outpatient Therapy  Client Statement of Needs Patricia Boyer would like to work on managing day-to-day stressors, boundaries for self and others, managing rumination, work and home-life balance, identifying areas of control and lack of control, managing distress tolerance at work, reducing personalizing at work, assertiveness, focusing on positives, managing symptoms, processing past events, verbalize feelings, engaging in mindfulness, process past events, engaging in self-care, improve self-esteem, and maintain consistency.   Treatment Level Weekly  Symptoms  Depression:  loss of interest, feeling down, poor sleep, lethargy, fluctuating appetite, feeling bad about self,  and difficulty concentrating.  (Status:  maintained) Anxiety: Feeling anxious,  difficulty managing worry, worrying about different things, trouble relaxing, irritability.  (Status: maintained)  Goals:   Patricia Boyer experiences symptoms of depression and anxiety.    Target Date: 12/27/23 Frequency: Weekly  Progress: 10% Modality: individual    Therapist will provide referrals for additional resources as appropriate.  Therapist will provide psycho-education regarding Patricia Boyer's diagnosis and corresponding treatment approaches and interventions. Licensed Clinical Social Worker, [place], LCSW will support the patient's ability to achieve the goals identified. will employ CBT, BA, Problem-solving, Solution Focused, Mindfulness,  coping skills, & other evidenced-based practices will be used to promote progress towards healthy functioning to help manage decrease symptoms associated with her diagnosis.   Reduce overall level, frequency, and intensity of the feelings of depression, anxiety and panic evidenced by decreased overall symptoms from 6 to 7 days/week to 0 to 1 days/week per client report for at least 3 consecutive months. Verbally express understanding of the relationship between feelings of depression, anxiety and their impact on thinking patterns and behaviors. Verbalize an understanding of the role that distorted thinking plays in creating fears, excessive worry, and ruminations.    Patricia Boyer participated in the creation of the treatment plan)   Patricia Mullet, LCSW

## 2023-11-20 ENCOUNTER — Encounter: Payer: Self-pay | Admitting: Cardiology

## 2023-11-20 DIAGNOSIS — I10 Essential (primary) hypertension: Secondary | ICD-10-CM

## 2023-12-03 MED ORDER — LABETALOL HCL 200 MG PO TABS: 200.0000 mg | ORAL_TABLET | Freq: Two times a day (BID) | ORAL | 1 refills | Status: DC

## 2023-12-05 ENCOUNTER — Ambulatory Visit (HOSPITAL_COMMUNITY)
Admission: RE | Admit: 2023-12-05 | Discharge: 2023-12-05 | Disposition: A | Discharge status: Home / Self Care | Source: Ambulatory Visit | Attending: Internal Medicine | Admitting: Internal Medicine

## 2023-12-05 ENCOUNTER — Ambulatory Visit (INDEPENDENT_AMBULATORY_CARE_PROVIDER_SITE_OTHER): Admitting: Psychology

## 2023-12-05 DIAGNOSIS — F418 Other specified anxiety disorders: Secondary | ICD-10-CM | POA: Diagnosis not present

## 2023-12-05 DIAGNOSIS — I119 Hypertensive heart disease without heart failure: Secondary | ICD-10-CM | POA: Diagnosis present

## 2023-12-05 DIAGNOSIS — F3289 Other specified depressive episodes: Secondary | ICD-10-CM

## 2023-12-05 LAB — ECHOCARDIOGRAM COMPLETE
Area-P 1/2: 2.83 cm2
S' Lateral: 2.7 cm

## 2023-12-05 NOTE — Progress Notes (Signed)
 [facility] Counselor/Therapist Progress Note  Patient ID: Patricia Boyer, MRN: [id number]   Date: 12/05/23  Time Spent: 2:03 pm -  2:48  pm : 45 Minutes  Treatment Type: Individual Therapy.  Reported Symptoms: Depression and Anxiety.   Mental Status Exam: Appearance:  Casual     Behavior: Appropriate  Motor: Normal  Speech/Language:  Clear and Coherent  Affect: Congruent  Mood: anxious  Thought process: normal  Thought content:   WNL  Sensory/Perceptual disturbances:   WNL  Orientation: oriented to person, place, time/date, and situation  Attention: Good  Concentration: Good  Memory: WNL  Fund of knowledge:  Good  Insight:   Good  Judgment:  Good  Impulse Control: Good    Risk Assessment: Danger to Self:  No Self-injurious Behavior: No Danger to Others: No Duty to Warn:no Physical Aggression / Violence:No  Access to Firearms a concern: No  Gang Involvement:No   Subjective:   [name] participated from home, via video and consented to treatment. Therapist participated from home office. Mahi reviewed the events of the past week. We experienced technical difficulties during the session and continued the session via phone. <50% of the session was via phone.  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 She noted mixed messages and unclear boundaries and noted her role, as a result, is unpredictable. She noted this lack of predictability resulted in feeling anxious. She noted the feedback consistently being provided verbally and not in written form. We worked on processing this during the session and identifying ways to get clarity regarding her job description including getting a description in written form & reiterating in-person discussions via written email. She noted why am I going along with it? In relation to her father stating  don't tell your sister regarding Mallorey disclosing her pregnancy. We worked on exploring this during the session. Taline noted that she couldn't find a reason for complying.  We worked on exploring this during the session. Terrionna provided background regarding her childhood in relation to growing up in a household with a controlling parent who attempts to control and manipulate throughout her childhood and adulthood. As we explored this during the session, she noted often focusing on he might feel verses her own wants and needs. The session ended abruptly due to technological issues. Therapist attempted to call back but was unable to reach [place]. Therapist validated Aleda's feelings and experience and provided supportive therapy.    Diagnosis:  Other specified anxiety disorders  Other depression  Intervention:  CBT Interpersonal  Treatment Plan:  Client Abilities/Strengths Kailie is intelligent, well educated, and motivated for change.   Support System: Family and friends.   Client Treatment Preferences Outpatient Therapy  Client Statement of Needs Eugina would like to work on managing day-to-day stressors, boundaries for self and others, managing rumination, work and home-life balance, identifying areas of control and lack of control, managing distress tolerance at work, reducing personalizing at work, assertiveness, focusing on positives, managing symptoms, processing past events, verbalize feelings, engaging in mindfulness, process past events, engaging in self-care, improve self-esteem, and maintain consistency.   Treatment Level Weekly  Symptoms  Depression:  loss of interest, feeling down, poor sleep, lethargy, fluctuating appetite, feeling bad about self,  and difficulty concentrating.  (Status: maintained) Anxiety: Feeling anxious, difficulty managing worry, worrying about different things, trouble relaxing, irritability.  (Status: maintained)  Goals:  Santasia  experiences symptoms of depression and anxiety.    Target Date: 12/27/23 Frequency: Weekly  Progress: 10% Modality: individual    Therapist will provide referrals for additional resources as appropriate.  Therapist will provide psycho-education regarding Denicia's diagnosis and corresponding treatment approaches and interventions. Licensed Clinical Social Worker, [place], LCSW will support the patient's ability to achieve the goals identified. will employ CBT, BA, Problem-solving, Solution Focused, Mindfulness,  coping skills, & other evidenced-based practices will be used to promote progress towards healthy functioning to help manage decrease symptoms associated with her diagnosis.   Reduce overall level, frequency, and intensity of the feelings of depression, anxiety and panic evidenced by decreased overall symptoms from 6 to 7 days/week to 0 to 1 days/week per client report for at least 3 consecutive months. Verbally express understanding of the relationship between feelings of depression, anxiety and their impact on thinking patterns and behaviors. Verbalize an understanding of the role that distorted thinking plays in creating fears, excessive worry, and ruminations.    [name] participated in the creation of the treatment plan)   [name], LCSW

## 2023-12-06 ENCOUNTER — Ambulatory Visit (INDEPENDENT_AMBULATORY_CARE_PROVIDER_SITE_OTHER): Admitting: Pharmacist

## 2023-12-06 ENCOUNTER — Encounter: Payer: Self-pay | Admitting: Pharmacist

## 2023-12-06 VITALS — BP 133/90 | HR 89

## 2023-12-06 DIAGNOSIS — I10 Essential (primary) hypertension: Secondary | ICD-10-CM | POA: Diagnosis not present

## 2023-12-06 DIAGNOSIS — O10919 Unspecified pre-existing hypertension complicating pregnancy, unspecified trimester: Secondary | ICD-10-CM | POA: Insufficient documentation

## 2023-12-06 MED ORDER — NIFEDIPINE ER OSMOTIC RELEASE 60 MG PO TB24: 60.0000 mg | ORAL_TABLET | Freq: Every day | ORAL | 1 refills | Status: AC

## 2023-12-06 NOTE — Progress Notes (Signed)
 Patient ID: ALAYZIA PAVLOCK                 DOB: [birth year]                      MRN: [id number]     HPI: Patricia Boyer is a 43 y.o. female referred by Patricia Boyer to Cardio OB clinic. PMH is significant for HTN, depression, and anxiety. Patient currently [redacted]w[redacted]d.  Patient currently managed on labetalol  200mg  BID and nifedipine  30mg  once daily.  Reports no adverse effects.   Sent myChart message on 10/8 with home BP readings. Readings variable from as low as 128/86 to 156/85.  Patient checks BP multiple times a night because it is high and this worries her.    Has been started on sertraline which has been titrated up to 50mg  daily. Has noticed recently she has been itchy after taking sertraline but is not sure if it is due to medication.  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  Gets winded when walking up stairs which is new. Had echocardiogram this morning. Denies CP and swelling. Has occasional headaches which ar relieved by Tylenol.  Does not add salt to food. Drinks 1 cup of coffee daily.   Current HTN meds:  Labetalol  200mg  BID Nifedipine  30mg  daily  Wt Readings from Last 3 Encounters:  10/25/23 287 lb 4.8 oz (130.3 kg)  09/18/23 287 lb 3.2 oz (130.3 kg)  08/24/23 286 lb (129.7 kg)   BP Readings from Last 3 Encounters:  10/25/23 132/86  09/18/23 (!) 140/80  08/24/23 (!) 158/102   Pulse Readings from Last 3 Encounters:  10/25/23 87  09/18/23 (!) 106  08/24/23 73    Renal function: CrCl cannot be calculated (Patient's most recent lab result is older than the maximum 21 days allowed.).  Past Medical History:  Diagnosis Date   Anemia    Anxiety    Arthritis    Cataract    Depression    GERD (gastroesophageal reflux disease)     Current Outpatient Medications on File Prior to Visit  Medication Sig Dispense Refill   sertraline (ZOLOFT) 50 MG tablet Take 50 mg by mouth daily.     folic acid  (FOLVITE )  1 MG tablet Take 1 tablet (1 mg total) by mouth daily. 100 tablet 3   labetalol  (NORMODYNE ) 200 MG tablet Take 1 tablet (200 mg total) by mouth 2 (two) times daily. 180 tablet 1   NIFEdipine  (PROCARDIA  XL) 30 MG 24 hr tablet Take 1 tablet (30 mg total) by mouth daily. 30 tablet 11   Prenatal Vit-Fe Fumarate-FA (MULTIVITAMIN-PRENATAL) 27-0.8 MG TABS tablet TAKE 1 TABLET BY MOUTH DAILY AT 12 NOON. 90 tablet 1   No current facility-administered medications on file prior to visit.    No Known Allergies   Assessment/Plan:  1. Hypertension -  Patient BP 131/89 in room. Rechecked at 133/90. Recommend increasing nifedipine  to 60mg  once daily since diastolic BP borderline high in office and elevated at home.    Recommended taking BP about two hours after medications and not to take multiple times in a row as her anxiety may be increasing BP.  Recommended trying sertraline by itself over this weekend to see if she experiences itching. If so, reach out to PCP to switch medication or manufacturer.  Has appt with Patricia Boyer in December. Will follow up in November before her cardiology appt.  Continue labetalol  200mg   daily Increase nifedipine  to 60mg  once daily Send BP readings over mychart Recheck in 4 weeks  Patricia Boyer, PharmD, [place], CDCES, CPP [facility] [address] [place], [place] [address] Phone: [phone]; Fax: [phone] 12/06/2023 5:09 PM

## 2023-12-06 NOTE — Patient Instructions (Signed)
 SABRA

## 2023-12-12 ENCOUNTER — Ambulatory Visit: Payer: Self-pay | Admitting: Cardiology

## 2023-12-31 ENCOUNTER — Encounter: Payer: Self-pay | Admitting: Pharmacist

## 2024-01-02 ENCOUNTER — Ambulatory Visit: Admitting: Pharmacist

## 2024-01-03 ENCOUNTER — Inpatient Hospital Stay (HOSPITAL_COMMUNITY)
Admission: AD | Admit: 2024-01-03 | Discharge: 2024-01-03 | Disposition: A | Discharge status: Home / Self Care | Attending: Obstetrics and Gynecology | Admitting: Obstetrics and Gynecology

## 2024-01-03 ENCOUNTER — Other Ambulatory Visit: Payer: Self-pay

## 2024-01-03 ENCOUNTER — Encounter (HOSPITAL_COMMUNITY): Payer: Self-pay | Admitting: Obstetrics and Gynecology

## 2024-01-03 DIAGNOSIS — O368130 Decreased fetal movements, third trimester, not applicable or unspecified: Secondary | ICD-10-CM | POA: Insufficient documentation

## 2024-01-03 DIAGNOSIS — O10913 Unspecified pre-existing hypertension complicating pregnancy, third trimester: Secondary | ICD-10-CM | POA: Insufficient documentation

## 2024-01-03 DIAGNOSIS — Z3A28 28 weeks gestation of pregnancy: Secondary | ICD-10-CM | POA: Diagnosis not present

## 2024-01-03 DIAGNOSIS — O09523 Supervision of elderly multigravida, third trimester: Secondary | ICD-10-CM | POA: Insufficient documentation

## 2024-01-03 NOTE — Discharge Instructions (Signed)
Daily kick ct

## 2024-01-03 NOTE — MAU Provider Note (Signed)
 History     Chief Complaint  Patient presents with   Decreased Fetal Movement    42 yo G3P0020 BF @ 28 6/[redacted] wk gestation presents with c/o decreased fetal movement. Per pt , she had only felt the baby move once.  Denies vaginal bleeding . 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    Family History  Problem Relation Age of Onset   Hypertension Mother    Sleep apnea Mother    Arthritis Father    Vision loss Father    Diabetes Maternal Grandmother    Diabetes Maternal Uncle     Social History   Tobacco Use   Smoking status: Never   Smokeless tobacco: Never  Vaping Use   Vaping status: Never Used  Substance Use Topics   Alcohol use: Not Currently    Alcohol/week: 1.0 standard drink of alcohol   Drug use: No    Allergies: No Known Allergies  Medications Prior to Admission  Medication Sig Dispense Refill Last Dose/Taking   folic acid  (FOLVITE ) 1 MG tablet Take 1 tablet (1 mg total) by mouth daily. 100 tablet 3 01/03/2024   labetalol  (NORMODYNE ) 200 MG tablet Take 1 tablet (200 mg total) by mouth 2 (two) times daily. 180 tablet 1 01/03/2024   NIFEdipine  (PROCARDIA  XL/NIFEDICAL XL) 60 MG 24 hr tablet Take 1 tablet (60 mg total) by mouth daily. 90 tablet 1 01/03/2024   Prenatal Vit-Fe Fumarate-FA (MULTIVITAMIN-PRENATAL) 27-0.8 MG TABS tablet TAKE 1 TABLET BY MOUTH DAILY AT 12 NOON. 90 tablet 1 01/03/2024   sertraline (ZOLOFT) 50 MG tablet Take 50 mg by mouth daily.   01/02/2024     Physical Exam   Blood pressure (!) 134/91, pulse 79, temperature 98.7 F  (37.1 C), temperature source Oral, resp. rate 16, height 5' 8 (1.727 m), weight 134.1 kg, last menstrual period 06/15/2023, SpO2 98%.  General appearance: cooperative and no distress Lungs: clear to auscultation bilaterally Heart: regular rate and rhythm, S1, S2 normal, no murmur, click, rub or gallop Abdomen: gravid Extremities: edema +1 Tracing:  baseline 130 (+) accel to 140 No ctx ED Course  IMP . Decreased fetal movement 3rd trimester IUP @ 28 6/7 wk Chronic HTN affecting pregnancy  on medication Reassuring fetal testing P) d/c home reiterate the need to eat freq small meals. Daily kick ct  MDM   Patricia DELENA Carder, MD 6:16 PM 01/03/2024

## 2024-01-03 NOTE — MAU Note (Signed)
 Patricia Boyer is a 43 y.o. at [redacted]w[redacted]d here in MAU reporting: no fetal movement all day. Baby was moving yesterday . She Denies LOF, VB, regular contractions, blurry vision, headaches, peripheral edema, or RUQ pain.   LMP: na Onset of complaint: today Pain score: 0/10 Vitals:   01/03/24 1543  BP: (!) 128/92  Pulse: 88  Resp: 16  Temp: 98.7 F (37.1 C)  SpO2: 100%     FHT: 144  Lab orders placed from triage: none

## 2024-01-06 ENCOUNTER — Encounter: Payer: Self-pay | Admitting: Pharmacist

## 2024-01-06 ENCOUNTER — Ambulatory Visit: Attending: Cardiology | Admitting: Pharmacist

## 2024-01-06 VITALS — BP 129/89

## 2024-01-06 DIAGNOSIS — Z3A29 29 weeks gestation of pregnancy: Secondary | ICD-10-CM

## 2024-01-06 DIAGNOSIS — O163 Unspecified maternal hypertension, third trimester: Secondary | ICD-10-CM | POA: Diagnosis not present

## 2024-01-06 NOTE — Progress Notes (Signed)
 Patient ID: Patricia Boyer                 DOB: 01-Jan-1981                      MRN: [id number]     HPI: Patricia Boyer is a 43 y.o. female referred by Dr. Rutherford to Cardio OB clinic. PMH is significant for HTN, depression, and anxiety. Patient currently [redacted]w[redacted]d.  EDD 03/21/24.  Patient currently managed on labetalol  200mg  BID (two 100mg  tablets) and nifedipine  60mg  ( two 30mg  tablets) once daily.  Reports no adverse effects.  Patient presents today for HTN follow up. Went to MAU over weekend due to not feeling fetal movement. Was advised to increase number of meals she is eating which is challenging for her since she is not hungry.  Major stressor is lack of sleep. Will fall asleep for ~2 hours at a time and then wake back up. Was awake between 1AM to 4AM last night. Previously reported itching on sertraline however this has resolved.  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  Brought home cuff. Patient says first reading usually unreliable. First reading in room : 182/147   Current HTN meds:  Labetalol  200mg  BID Nifedipine  60mg  daily  Wt Readings from Last 3 Encounters:  01/03/24 295 lb 9.6 oz (134.1 kg)  10/25/23 287 lb 4.8 oz (130.3 kg)  09/18/23 287 lb 3.2 oz (130.3 kg)   BP Readings from Last 3 Encounters:  01/03/24 (!) 134/91  12/06/23 (!) 133/90  10/25/23 132/86   Pulse Readings from Last 3 Encounters:  01/03/24 79  12/06/23 89  10/25/23 87    Renal function: CrCl cannot be calculated (Patient's most recent lab result is older than the maximum 21 days allowed.).  Past Medical History:  Diagnosis Date   Anemia    Anxiety    Arthritis    Cataract    Depression    GERD (gastroesophageal reflux disease)     Current Outpatient Medications on File Prior to Visit  Medication Sig Dispense Refill   folic acid  (FOLVITE ) 1 MG tablet Take 1 tablet (1 mg total) by mouth daily. 100 tablet 3   labetalol   (NORMODYNE ) 200 MG tablet Take 1 tablet (200 mg total) by mouth 2 (two) times daily. 180 tablet 1   NIFEdipine  (PROCARDIA  XL/NIFEDICAL XL) 60 MG 24 hr tablet Take 1 tablet (60 mg total) by mouth daily. 90 tablet 1   Prenatal Vit-Fe Fumarate-FA (MULTIVITAMIN-PRENATAL) 27-0.8 MG TABS tablet TAKE 1 TABLET BY MOUTH DAILY AT 12 NOON. 90 tablet 1   sertraline (ZOLOFT) 50 MG tablet Take 50 mg by mouth daily.     No current facility-administered medications on file prior to visit.    No Known Allergies   Assessment/Plan:  1. Hypertension -  Patient BP 122/88 and rechecked at 129/89. No med changes needed at this time. Recommended continuing to monitor at home. Cautioned to monitor salt intake since she has been instructed to increase meals. Recheck with Dr Sheena on 12/5.  Continue labetalol  200mg  daily Continue nifedipine  to 60mg  daily F/u with Dr Sheena in 2 weeks  Medford Bolk, PharmD, BCACP, CDCES, CPP [facility] [address], [place], [place] [address] Phone: [phone]; Fax: [phone] 01/06/2024 7:39 AM

## 2024-01-06 NOTE — Patient Instructions (Signed)
  It was good seeing you again  Your blood pressure is looking good this morning  Continue your Labetalol  200mg  (2 tablets of the 100mg ) twice a day and nifedipine  60mg  (2 tablets of 30mg ) in the morning  Continue to monitory your home blood pressures  Try to watch how much salt you are eating when you increase your meals  Please follow up with Dr Sheena on 12/5  Medford Bolk, PharmD, BCACP, CDCES, CPP [facility] [address] [place], [place] [address] Phone: [phone]; Fax: [phone] 01/06/2024 8:30 AM

## 2024-01-17 ENCOUNTER — Ambulatory Visit: Attending: Cardiology | Admitting: Cardiology

## 2024-01-17 ENCOUNTER — Encounter: Payer: Self-pay | Admitting: Cardiology

## 2024-01-17 VITALS — BP 138/100 | Ht 68.0 in | Wt 289.8 lb

## 2024-01-17 DIAGNOSIS — Z3A31 31 weeks gestation of pregnancy: Secondary | ICD-10-CM | POA: Diagnosis not present

## 2024-01-17 DIAGNOSIS — O10919 Unspecified pre-existing hypertension complicating pregnancy, unspecified trimester: Secondary | ICD-10-CM

## 2024-01-17 MED ORDER — LABETALOL HCL 300 MG PO TABS: 300.0000 mg | ORAL_TABLET | Freq: Two times a day (BID) | ORAL | 3 refills | Status: DC

## 2024-01-17 NOTE — Patient Instructions (Signed)
 Medication Instructions:  Your physician has recommended you make the following change in your medication:  INCREASE: Labetalol  300 mg twice daily  *If you need a refill on your cardiac medications before your next appointment, please call your pharmacy*   Follow-Up: At [facility], you and your health needs are our priority.  As part of our continuing mission to provide you with exceptional heart care, our providers are all part of one team.  This team includes your primary Cardiologist (physician) and Advanced Practice Providers or APPs (Physician Assistants and Nurse Practitioners) who all work together to provide you with the care you need, when you need it.  Your next appointment:   2 week(s)  Provider:   Lum Louis, NP or Medford SQUIBB then, Kardie Tobb, DO will plan to see you again in 6 week(s).

## 2024-01-17 NOTE — Progress Notes (Signed)
 Cardio-Obstetrics Clinic  Follow Up Note   Date:  01/17/2024   ID:  Patricia Boyer, DOB 1980/05/16, MRN [id number]  PCP:  Georgina Speaks, FNP   [facility] Providers Cardiologist:  Dub Huntsman, DO  Electrophysiologist:  None        Referring MD: Georgina Speaks, FNP   Chief Complaint:  I am doing well  History of Present Illness:    Patricia Boyer is a 43 y.o. female [G3P0020] who returns for follow up of chronic hypertension in pregnancy.   Medical hx includes Depression, Anxiety, chronic hypertension of pregnancy.   She is here today for a follow up.   She notes variable home blood pressure readings ranging from 128 to 142 mmHg. She takes irbesartan 60 mg and labetalol  100 mg twice daily and prefers twice-daily dosing. She is [redacted] weeks pregnant and will be 31 weeks tomorrow. She receives obstetric care with Dr. Rutherford.  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   labetalol  (NORMODYNE ) 300 MG tablet Take 1 tablet (300 mg total) by mouth 2 (two) times daily.   NIFEdipine  (PROCARDIA  XL/NIFEDICAL XL) 60 MG 24 hr tablet Take 1 tablet (60 mg total) by mouth daily.   Prenatal Vit-Fe Fumarate-FA (MULTIVITAMIN-PRENATAL) 27-0.8 MG TABS tablet TAKE 1 TABLET BY MOUTH DAILY AT 12 NOON.   sertraline (ZOLOFT) 50 MG tablet Take 50 mg by mouth daily.   [DISCONTINUED]  labetalol  (NORMODYNE ) 200 MG tablet Take 1 tablet (200 mg total) by mouth 2 (two) times daily.     Allergies:   Patient has no known allergies.   Social History   Socioeconomic History   Marital status: Single    Spouse name: Not on file   Number of children: Not on file   Years of education: Not on file   Highest education level: Doctorate  Occupational History   Not on file  Tobacco Use   Smoking status: Never   Smokeless tobacco: Never  Vaping Use   Vaping status: Never Used  Substance and Sexual Activity   Alcohol use: Not Currently    Alcohol/week: 1.0 standard drink of alcohol   Drug use: No   Sexual activity: Yes  Other Topics Concern   Not on file  Social History Narrative   Lives alone   Right handed   Caffeine : 1.5 cups/day   Social Drivers of Health   Financial Resource Strain: Medium Risk (07/30/2023)   Overall Financial Resource Strain (CARDIA)  Difficulty of Paying Living Expenses: Somewhat hard  Food Insecurity: No Food Insecurity (07/30/2023)   Hunger Vital Sign    Worried About Running Out of Food in the Last Year: Never true    Ran Out of Food in the Last Year: Never true  Transportation Needs: No Transportation Needs (07/30/2023)   PRAPARE - Administrator, Civil Service (Medical): No    Lack of Transportation (Non-Medical): No  Physical Activity: Insufficiently Active (07/30/2023)   Exercise Vital Sign    Days of Exercise per Week: 3 days    Minutes of Exercise per Session: 30 min  Stress: Stress Concern Present (07/30/2023)   [employer] of Occupational Health - Occupational Stress Questionnaire    Feeling of Stress: To some extent  Social Connections: Socially Integrated (07/30/2023)   Social Connection and Isolation Panel    Frequency of Communication with Friends and Family: More than three times a week    Frequency of Social Gatherings with Friends and Family: Once a week    Attends Religious Services: 1 to 4 times per year     Active Member of [employer] or Organizations: Yes    Attends Engineer, Structural: More than 4 times per year    Marital Status: Living with partner      Family History  Problem Relation Age of Onset   Hypertension Mother    Sleep apnea Mother    Arthritis Father    Vision loss Father    Diabetes Maternal Grandmother    Diabetes Maternal Uncle       ROS:   Please see the history of present illness.     All other systems reviewed and are negative.   Labs/EKG Reviewed:    EKG:   EKG was ordered today.  The ekg ordered today demonstrates sinus rhythm  Recent Labs: 09/18/2023: ALT 23; BUN 9; Creatinine, Ser 0.74; Hemoglobin 14.2; Platelets 294; Potassium 4.1; Sodium 136; TSH 2.400   Recent Lipid Panel Lab Results  Component Value Date/Time   CHOL 200 (H) 09/18/2023 04:03 PM   TRIG 72 09/18/2023 04:03 PM   HDL 75 09/18/2023 04:03 PM   CHOLHDL 2.7 09/18/2023 04:03 PM   LDLCALC 112 (H) 09/18/2023 04:03 PM    Physical Exam:    VS:  BP (!) 138/100   Ht 5' 8 (1.727 m)   Wt 289 lb 12.8 oz (131.5 kg)   LMP 06/15/2023   BMI 44.06 kg/m     Wt Readings from Last 3 Encounters:  01/17/24 289 lb 12.8 oz (131.5 kg)  01/03/24 295 lb 9.6 oz (134.1 kg)  10/25/23 287 lb 4.8 oz (130.3 kg)     GEN:  Well nourished, well developed in no acute distress HEENT: Normal NECK: No JVD; No carotid bruits LYMPHATICS: No lymphadenopathy CARDIAC: RRR, no murmurs, rubs, gallops RESPIRATORY:  Clear to auscultation without rales, wheezing or rhonchi  ABDOMEN: Soft, non-tender, non-distended MUSCULOSKELETAL:  No edema; No deformity  SKIN: Warm and dry NEUROLOGIC:  Alert and oriented x 3 PSYCHIATRIC:  Normal affect    Risk Assessment/Risk Calculators:                 ASSESSMENT & PLAN:    Gestational hypertension, third trimester  Suboptimal blood pressure control with current regimen of irbesartan and labetalol . Adjusted medication to prevent complications at 35-[redacted] weeks  gestation. Increased labetalol  to 300 mg twice daily. Follow-up with pharmacist or nurse practitioner in two weeks to assess blood pressure control. Follow-up  with me in 6 weeks.   Patient Instructions  Medication Instructions:  Your physician has recommended you make the following change in your medication:  INCREASE: Labetalol  300 mg twice daily  *If you need a refill on your cardiac medications before your next appointment, please call your pharmacy*   Follow-Up: At [facility], you and your health needs are our priority.  As part of our continuing mission to provide you with exceptional heart care, our providers are all part of one team.  This team includes your primary Cardiologist (physician) and Advanced Practice Providers or APPs (Physician Assistants and Nurse Practitioners) who all work together to provide you with the care you need, when you need it.  Your next appointment:   2 week(s)  Provider:   Lum Louis, NP or [place] SQUIBB then, Charmon Thorson, DO will plan to see you again in 6 week(s).       Dispo:  No follow-ups on file.   Medication Adjustments/Labs and Tests Ordered: Current medicines are reviewed at length with the patient today.  Concerns regarding medicines are outlined above.  Tests Ordered: No orders of the defined types were placed in this encounter.  Medication Changes: Meds ordered this encounter  Medications   labetalol  (NORMODYNE ) 300 MG tablet    Sig: Take 1 tablet (300 mg total) by mouth 2 (two) times daily.    Dispense:  60 tablet    Refill:  3

## 2024-01-22 ENCOUNTER — Telehealth: Payer: Self-pay | Admitting: Nurse Practitioner

## 2024-01-22 NOTE — Progress Notes (Unsigned)
° °  Virtual Visit via Video Note  LILLETTE Kristeen [name] Gladis, CMA,acting as a scribe for Boyer Ada, FNP.,have documented all relevant documentation on the behalf of Boyer Ada, FNP,as directed by  Boyer Ada, FNP while in the presence of Boyer Ada, FNP.  I connected with Patricia Boyer on 01/22/24 at  8:20 AM EST by a video enabled telemedicine application and verified that I am speaking with the correct person using two identifiers.  {Patient Location:[phone]::Home} {Provider [phone]::Home Office}  I discussed the limitations, risks, security, and privacy concerns of performing an evaluation and management service by video and the availability of in person appointments. I also discussed with the patient that there may be a patient responsible charge related to this service. The patient expressed understanding and agreed to proceed.  Subjective: PCP: Patricia Gaines, FNP  No chief complaint on file.  HPI   ROS: Per HPI  Current Outpatient Medications:    folic acid  (FOLVITE ) 1 MG tablet, Take 1 tablet (1 mg total) by mouth daily., Disp: 100 tablet, Rfl: 3   labetalol  (NORMODYNE ) 300 MG tablet, Take 1 tablet (300 mg total) by mouth 2 (two) times daily., Disp: 60 tablet, Rfl: 3   NIFEdipine  (PROCARDIA  XL/NIFEDICAL XL) 60 MG 24 hr tablet, Take 1 tablet (60 mg total) by mouth daily., Disp: 90 tablet, Rfl: 1   Prenatal Vit-Fe Fumarate-FA (MULTIVITAMIN-PRENATAL) 27-0.8 MG TABS tablet, TAKE 1 TABLET BY MOUTH DAILY AT 12 NOON., Disp: 90 tablet, Rfl: 1   sertraline (ZOLOFT) 50 MG tablet, Take 50 mg by mouth daily., Disp: , Rfl:   Observations/Objective: There were no vitals filed for this visit. Physical Exam  Assessment and Plan: There are no diagnoses linked to this encounter.  Follow Up Instructions: No follow-ups on file.   I discussed the assessment and treatment plan with the patient. The patient was provided an opportunity to ask questions, and all were answered.  The patient agreed with the plan and demonstrated an understanding of the instructions.   The patient was advised to call back or seek an in-person evaluation if the symptoms worsen or if the condition fails to improve as anticipated.  The above assessment and management plan was discussed with the patient. The patient verbalized understanding of and has agreed to the management plan.   LILLETTE Boyer Ada, FNP, have reviewed all documentation for this visit. The documentation on 01/22/24 for the exam, diagnosis, procedures, and orders are all accurate and complete.

## 2024-01-23 ENCOUNTER — Telehealth: Admitting: Nurse Practitioner

## 2024-01-27 ENCOUNTER — Ambulatory Visit: Payer: Self-pay | Admitting: Dermatology

## 2024-01-27 ENCOUNTER — Encounter: Payer: Self-pay | Admitting: Dermatology

## 2024-01-27 VITALS — BP 144/90

## 2024-01-27 DIAGNOSIS — L639 Alopecia areata, unspecified: Secondary | ICD-10-CM

## 2024-01-27 DIAGNOSIS — L219 Seborrheic dermatitis, unspecified: Secondary | ICD-10-CM

## 2024-01-27 MED ORDER — FLUOCINOLONE ACETONIDE SCALP 0.01 % EX OIL: 1.0000 | TOPICAL_OIL | Freq: Every day | CUTANEOUS | 5 refills | Status: AC | PRN

## 2024-01-27 NOTE — Patient Instructions (Addendum)
 VISIT SUMMARY:  Today, we discussed your hair regrowth during pregnancy and evaluated your current treatment plan for alopecia areata. We also addressed your previous issues with scalp irritation and provided recommendations for managing seborrheic dermatitis.  YOUR PLAN:  -ALOPECIA AREATA:  Alopecia areata is a condition that causes hair loss in patches. Your hair has started to regrow, likely due to the immune suppression effects of pregnancy. We recommend using fluocinolone  oil for spot treatment after delivery if the alopecia returns. Avoid using clobetasol during pregnancy and breastfeeding. If needed, we can consider oral medications after you finish breastfeeding.  -SEBORRHEIC DERMATITIS vs Contact Dermatitis:  Seborrheic dermatitis is a condition that causes scaly patches and red skin, mainly on the scalp. Your symptoms were likely worsened by using rosemary oil. We recommend avoiding rosemary and other strong essential oils. Instead, use milder oils like castor or jojoba oil for scalp care. Be cautious not to apply too much oil to avoid greasy flakiness.  INSTRUCTIONS:  Please follow up after delivery to reassess your alopecia areata and discuss further treatment options if necessary. Continue to avoid rosemary oil and monitor your scalp for any irritation.      Important Information  Due to recent changes in healthcare laws, you may see results of your pathology and/or laboratory studies on MyChart before the doctors have had a chance to review them. We understand that in some cases there may be results that are confusing or concerning to you. Please understand that not all results are received at the same time and often the doctors may need to interpret multiple results in order to provide you with the best plan of care or course of treatment. Therefore, we ask that you please give us  2 business days to thoroughly review all your results before contacting the office for  clarification. Should we see a critical lab result, you will be contacted sooner.   If You Need Anything After Your Visit  If you have any questions or concerns for your doctor, please call our main line at [phone] If no one answers, please leave a voicemail as directed and we will return your call as soon as possible. Messages left after 4 pm will be answered the following business day.   You may also send us  a message via MyChart. We typically respond to MyChart messages within 1-2 business days.  For prescription refills, please ask your pharmacy to contact our office. Our fax number is [phone].  If you have an urgent issue when the clinic is closed that cannot wait until the next business day, you can page your doctor at the number below.    Please note that while we do our best to be available for urgent issues outside of office hours, we are not available 24/7.   If you have an urgent issue and are unable to reach us , you may choose to seek medical care at your doctor's office, retail clinic, urgent care center, or emergency room.  If you have a medical emergency, please immediately call 911 or go to the emergency department. In the event of inclement weather, please call our main line at [phone] for an update on the status of any delays or closures.  Dermatology Medication Tips: Please keep the boxes that topical medications come in in order to help keep track of the instructions about where and how to use these. Pharmacies typically print the medication instructions only on the boxes and not directly on the medication tubes.   If  your medication is too expensive, please contact our office at [phone] or send us  a message through MyChart.   We are unable to tell what your co-pay for medications will be in advance as this is different depending on your insurance coverage. However, we may be able to find a substitute medication at lower cost or fill out paperwork to  get insurance to cover a needed medication.   If a prior authorization is required to get your medication covered by your insurance company, please allow us  1-2 business days to complete this process.  Drug prices often vary depending on where the prescription is filled and some pharmacies may offer cheaper prices.  The website www.goodrx.com contains coupons for medications through different pharmacies. The prices here do not account for what the cost may be with help from insurance (it may be cheaper with your insurance), but the website can give you the price if you did not use any insurance.  - You can print the associated coupon and take it with your prescription to the pharmacy.  - You may also stop by our office during regular business hours and pick up a GoodRx coupon card.  - If you need your prescription sent electronically to a different pharmacy, notify our office through [facility] or by phone at [phone]

## 2024-01-27 NOTE — Progress Notes (Signed)
" ° °  New Patient Visit   Subjective  Patricia Boyer is a 43 y.o. female who presents for a NEW PATIENT appointment to be examined for the concerns as listed below.  Patient (and/or pt guardian) consented to the use of AI-assisted tools for note generation.  Hair Loss: Pt notices hair changes 2 years ago when she lost hair towards the back of the head. She did mention around the time she was dealing with a few life stressors. Pt has previously saw a dermatologist in at [facility] located in [place] in 2023 who Rx her clobetasol solution that she applied for 3-37mo but D/C as she did not see regrowth. She used OTC Camilla Rose drops for hair loss to help encourage hair regrowth.   Are you nursing, pregnant or trying to conceive? Pregnant, due 03/21/24.   The following portions of the chart were reviewed this encounter and updated as appropriate: medications, allergies, medical history  Review of Systems:  No other skin or systemic complaints except as noted in HPI or Assessment and Plan.  Objective  Well appearing patient in no apparent distress; mood and affect are within normal limits.   A focused examination was performed of the following areas: scalp   Relevant exam findings are noted in the Assessment and Plan.                Assessment & Plan    Alopecia areata Diagnosed in 2023 with regrowth earlier this year. Currently not active, likely due to pregnancy-induced immune suppression. Clobetasol was previously used with minimal results. Fluocinolone  oil is recommended as a milder alternative, especially considering breastfeeding plans. Oral medications are available post-breastfeeding if needed. - Prescribed fluocinolone  oil for spot treatment post-delivery if alopecia recurs. - Advised against using clobetasol during pregnancy and breastfeeding. - Discussed potential for oral medications post-breastfeeding if alopecia worsens.  Seborrheic dermatitis Previous episodes of  scabbing and flakiness, possibly exacerbated by rosemary oil. Symptoms resolved after discontinuing rosemary oil. Discussed potential irritants and recommended alternative oils. - Advised avoiding rosemary oil and other potent essential oils. - Recommended using castor or jojoba oil for scalp care. - Educated on limiting oil application to prevent greasy flakiness.   Return if symptoms worsen or fail to improve, for ALOPECIA AREATA.   Documentation: I have reviewed the above documentation for accuracy and completeness, and I agree with the above.  I, Shirron Maranda, CMA II, am acting as scribe for:   Delon Lenis, DO     "

## 2024-02-02 NOTE — Progress Notes (Signed)
 Not seen

## 2024-02-03 ENCOUNTER — Inpatient Hospital Stay (HOSPITAL_COMMUNITY)
Admission: AD | Admit: 2024-02-03 | Discharge: 2024-02-03 | Disposition: A | Discharge status: Home / Self Care | Attending: Obstetrics and Gynecology | Admitting: Obstetrics and Gynecology

## 2024-02-03 ENCOUNTER — Inpatient Hospital Stay (HOSPITAL_COMMUNITY)

## 2024-02-03 ENCOUNTER — Other Ambulatory Visit: Payer: Self-pay | Admitting: Maternal & Fetal Medicine

## 2024-02-03 DIAGNOSIS — O99213 Obesity complicating pregnancy, third trimester: Secondary | ICD-10-CM | POA: Insufficient documentation

## 2024-02-03 DIAGNOSIS — O09523 Supervision of elderly multigravida, third trimester: Secondary | ICD-10-CM | POA: Diagnosis not present

## 2024-02-03 DIAGNOSIS — O365930 Maternal care for other known or suspected poor fetal growth, third trimester, not applicable or unspecified: Secondary | ICD-10-CM | POA: Diagnosis not present

## 2024-02-03 DIAGNOSIS — O10013 Pre-existing essential hypertension complicating pregnancy, third trimester: Secondary | ICD-10-CM

## 2024-02-03 DIAGNOSIS — E669 Obesity, unspecified: Secondary | ICD-10-CM

## 2024-02-03 DIAGNOSIS — Z3A33 33 weeks gestation of pregnancy: Secondary | ICD-10-CM | POA: Diagnosis not present

## 2024-02-03 DIAGNOSIS — O365990 Maternal care for other known or suspected poor fetal growth, unspecified trimester, not applicable or unspecified: Secondary | ICD-10-CM | POA: Insufficient documentation

## 2024-02-03 DIAGNOSIS — Z369 Encounter for antenatal screening, unspecified: Secondary | ICD-10-CM | POA: Insufficient documentation

## 2024-02-03 DIAGNOSIS — Z79899 Other long term (current) drug therapy: Secondary | ICD-10-CM | POA: Diagnosis not present

## 2024-02-03 DIAGNOSIS — O09529 Supervision of elderly multigravida, unspecified trimester: Secondary | ICD-10-CM | POA: Insufficient documentation

## 2024-02-03 DIAGNOSIS — I10 Essential (primary) hypertension: Secondary | ICD-10-CM

## 2024-02-03 NOTE — Discharge Instructions (Signed)
Daily kick ct, preeclampsia warning signs

## 2024-02-03 NOTE — MAU Provider Note (Signed)
 History     Chief Complaint  Patient presents with   Non-stress Test   43 yo G3p0020 SBF @ 33 2/[redacted] wk gestation sent for antepartum testing due to dx of IUGR. 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 118 mL 5    folic acid  (FOLVITE ) 1 MG tablet Take 1 tablet (1 mg total) by mouth daily. 100 tablet 3    labetalol  (NORMODYNE ) 300 MG tablet Take 1 tablet (300 mg total) by mouth 2 (two) times daily. 60 tablet 3    NIFEdipine  (PROCARDIA  XL/NIFEDICAL XL) 60 MG 24 hr tablet Take 1 tablet (60 mg total) by mouth daily. 90 tablet 1    Prenatal Vit-Fe Fumarate-FA (MULTIVITAMIN-PRENATAL) 27-0.8 MG TABS tablet TAKE 1 TABLET BY MOUTH DAILY AT 12 NOON. 90 tablet 1    sertraline (ZOLOFT) 50 MG tablet Take 50 mg by mouth daily.        Physical Exam   Blood pressure (!) 143/96, pulse 76, temperature 97.9 F (36.6 C), resp. rate 18, height 5' 8 (1.727 m), last menstrual period 06/15/2023.  No exam performed today, here for testing.  Tracing: baseline  130  -135 good variability. (+0 accels 15 x 15 no ctx  US  MFM Fetal BPP Wo Non Stress Result Date: 02/03/2024 ----------------------------------------------------------------------  OBSTETRICS REPORT                       (Signed Final 02/03/2024 05:34 pm) ---------------------------------------------------------------------- Patient Info  ID #:       [id number]                          D.O.B.:  Mar 28, 1980 (43 yrs)(F)  Name:       Patricia Boyer                 Visit Date: 02/03/2024 10:44 am ---------------------------------------------------------------------- Performed By  Attending:        Fredia Fresh MD        Secondary Phy.:   DICKIE  Carole Doner MD  Performed By:     Powell Breen BS       Address:          Anice Pang                    RDMS                                                             OB/GYN                                                             [phone]                                                             [place]                                                             [address]  Referred By:      [place] [facility]/Triage         [facility] ---------------------------------------------------------------------- Orders  #  Description                           Code        Ordered By  1  US  MFM FETAL BPP WO NON               76819.01    Asah Lamay     STRESS                                            Andilyn Bettcher  2  US  MFM UA CORD DOPPLER                76820.02    Corayma Cashatt  [employer] ----------------------------------------------------------------------  #  Order #                     Accession #                Episode #  1  [id number]                   [id number]                 [id number]  2  [id number]                    [id number]                 [id number] ---------------------------------------------------------------------- Indications  [redacted] weeks gestation of pregnancy                Z3A.7  Advanced maternal age multigravida 64+,        O66.523  third trimester  Pre-existing essential hypertension            O10.013  complicating pregnancy, third trimester  (labetalol )  Maternal care for known or suspected poor      O36.5930  fetal growth, third trimester, single or  unspecified fetus IUGR  Encounter for antenatal screening,             Z36.9  unspecified  Obesity complicating pregnancy, third          O99.213  trimester ---------------------------------------------------------------------- Fetal Evaluation  Num Of Fetuses:         1  Fetal Heart Rate(bpm):  136  Cardiac Activity:       Observed  Presentation:           Cephalic  Placenta:               Anterior  P. Cord Insertion:      Not well visualized  Amniotic Fluid  AFI FV:      Within normal limits  AFI Sum(cm)     %Tile       Largest Pocket(cm)  10.4            21          3.6  RUQ(cm)       RLQ(cm)       LUQ(cm)        LLQ(cm)  2             1.4           3.4            3.6 ---------------------------------------------------------------------- Biophysical Evaluation  Amniotic F.V:   Pocket => 2 cm             F. Tone:        Observed  F. Movement:    Observed                   Score:          8/8  F. Breathing:   Observed ---------------------------------------------------------------------- OB History  Gravidity:    3         Term:   0        Prem:   0        SAB:   2  TOP:          0       Ectopic:  0        Living: 0 ---------------------------------------------------------------------- Gestational Age  LMP:  33w 2d        Date:  06/15/23                   EDD:   03/21/24  Best:          33w 2d     Det. By:  LMP  (06/15/23)          EDD:   03/21/24 ---------------------------------------------------------------------- Anatomy  Cranium:                Appears normal         Stomach:                Appears normal, left                                                                        sided  Ventricles:            Appears normal         Kidneys:                Appear normal  Thoracic:              Appears normal         Bladder:                Appears normal  Diaphragm:             Appears normal ---------------------------------------------------------------------- Doppler - Fetal Vessels  Umbilical Artery   S/D     %tile      RI    %tile      PI    %tile     PSV    ADFV    RDFV                                                     (cm/s)    3.4       88     0.7       88     1.2       94     35.8      No      No ---------------------------------------------------------------------- Cervix Uterus Adnexa  Cervix  Not visualized (advanced GA >24wks)  Uterus  No abnormality visualized.  Right Ovary  Not visualized.  Left Ovary  Not visualized.  Cul De Sac  No free fluid seen.  Adnexa  No abnormality visualized ---------------------------------------------------------------------- Impression  BPP and UA Doppler studies were requested by the provider  because of fetal growth restriction detected at her office.  A limited ultrasound study was performed. Normal amniotic  fluid. Umbilical artery Doppler showed normal forward  diastolic flow. BPP 8/8. Cephalic presentation. ---------------------------------------------------------------------- Recommendations  -Appointment was made for her to come to our office on  02/07/24 for detailed anatomy and antenatal testing. ----------------------------------------------------------------------                  Fredia Fresh, MD Electronically Signed Final Report   02/03/2024 05:34 pm ----------------------------------------------------------------------   US  MFM UA CORD DOPPLER Result Date: 02/03/2024 ----------------------------------------------------------------------  OBSTETRICS REPORT                       (Signed Final  02/03/2024 05:34 pm) ---------------------------------------------------------------------- Patient Info  ID #:       [id number]                          D.O.B.:  20-Aug-1980 (43 yrs)(F)  Name:       Patricia Boyer                 Visit Date: 02/03/2024 10:44 am ---------------------------------------------------------------------- Performed By  Attending:        Fredia Fresh MD        Secondary Phy.:   DICKIE                                                             Romaldo Saville MD  Performed By:     Powell Breen BS       Address:          Anice Pang                    RDMS                                                             OB/GYN                                                             [address]                                                             [place]                                                             [address]  Referred By:      [facility]/Triage         [facility] ---------------------------------------------------------------------- Orders  #  Description                           Code        Ordered By  1  US  MFM FETAL BPP WO NON               76819.01    Dekker Verga     STRESS                                            Dimitria Ketchum  2  US  MFM UA CORD DOPPLER                76820.02    Naethan Bracewell ----------------------------------------------------------------------  #  Order #                     Accession #                Episode #  1  [id number]                   [id number]                 [id number]  2  [id number]                   [id number]                 [id number] ---------------------------------------------------------------------- Indications  [redacted] weeks gestation of pregnancy                Z3A.28  Advanced maternal age multigravida 66+,         O78.523  third trimester  Pre-existing essential hypertension            O10.013  complicating pregnancy, third trimester  (labetalol )  Maternal care for known or suspected poor      O36.5930  fetal growth, third trimester, single or  unspecified fetus IUGR  Encounter for antenatal screening,             Z36.9  unspecified  Obesity complicating pregnancy, third          O99.213  trimester ---------------------------------------------------------------------- Fetal Evaluation  Num Of Fetuses:         1  Fetal Heart Rate(bpm):  136  Cardiac Activity:       Observed  Presentation:           Cephalic  Placenta:               Anterior  P. Cord Insertion:      Not well visualized  Amniotic Fluid  AFI FV:      Within normal limits  AFI Sum(cm)     %Tile       Largest Pocket(cm)  10.4            21          3.6  RUQ(cm)       RLQ(cm)       LUQ(cm)  LLQ(cm)  2             1.4           3.4            3.6 ---------------------------------------------------------------------- Biophysical Evaluation  Amniotic F.V:   Pocket => 2 cm             F. Tone:        Observed  F. Movement:    Observed                   Score:          8/8  F. Breathing:   Observed ---------------------------------------------------------------------- OB History  Gravidity:    3         Term:   0        Prem:   0        SAB:   2  TOP:          0       Ectopic:  0        Living: 0 ---------------------------------------------------------------------- Gestational Age  LMP:           33w 2d        Date:  06/15/23                   EDD:   03/21/24  Best:          33w 2d     Det. By:  LMP  (06/15/23)          EDD:   03/21/24 ---------------------------------------------------------------------- Anatomy  Cranium:               Appears normal         Stomach:                Appears normal, left                                                                        sided  Ventricles:            Appears normal         Kidneys:                Appear normal   Thoracic:              Appears normal         Bladder:                Appears normal  Diaphragm:             Appears normal ---------------------------------------------------------------------- Doppler - Fetal Vessels  Umbilical Artery   S/D     %tile      RI    %tile      PI    %tile     PSV    ADFV    RDFV                                                     (cm/s)    3.4  88     0.7       88     1.2       94     35.8      No      No ---------------------------------------------------------------------- Cervix Uterus Adnexa  Cervix  Not visualized (advanced GA >24wks)  Uterus  No abnormality visualized.  Right Ovary  Not visualized.  Left Ovary  Not visualized.  Cul De Sac  No free fluid seen.  Adnexa  No abnormality visualized ---------------------------------------------------------------------- Impression  BPP and UA Doppler studies were requested by the provider  because of fetal growth restriction detected at her office.  A limited ultrasound study was performed. Normal amniotic  fluid. Umbilical artery Doppler showed normal forward  diastolic flow. BPP 8/8. Cephalic presentation. ---------------------------------------------------------------------- Recommendations  -Appointment was made for her to come to our office on  02/07/24 for detailed anatomy and antenatal testing. ----------------------------------------------------------------------                  Fredia Fresh, MD Electronically Signed Final Report   02/03/2024 05:34 pm ----------------------------------------------------------------------    ED Course  IUGR Chronic HTN on labetalol  and procardia   followed by Dr Sheena IUP @ 33 2/7  wk Reassuring fetal testing P) d/c home. Daily kick ct. F/u 1 wk. Await MFM appt  MDM   Dickie DELENA Carder, MD 12:15 PM 02/03/2024        [1]  Social History Tobacco Use   Smoking status: Never   Smokeless tobacco: Never  Vaping Use   Vaping status: Never Used  Substance Use Topics    Alcohol use: Not Currently    Alcohol/week: 1.0 standard drink of alcohol   Drug use: No  [2] No Known Allergies

## 2024-02-03 NOTE — MAU Note (Signed)
 Sent from office for NST. Sonographer not in  office  this week. Weekly NST for possible pre E. Reports good fetal movement denies any pain or cramping.

## 2024-02-07 ENCOUNTER — Ambulatory Visit: Attending: Obstetrics and Gynecology | Admitting: Maternal & Fetal Medicine

## 2024-02-07 ENCOUNTER — Ambulatory Visit

## 2024-02-07 VITALS — BP 126/76 | HR 89

## 2024-02-07 DIAGNOSIS — O365930 Maternal care for other known or suspected poor fetal growth, third trimester, not applicable or unspecified: Secondary | ICD-10-CM

## 2024-02-07 DIAGNOSIS — Z363 Encounter for antenatal screening for malformations: Secondary | ICD-10-CM | POA: Diagnosis present

## 2024-02-07 DIAGNOSIS — O09523 Supervision of elderly multigravida, third trimester: Secondary | ICD-10-CM

## 2024-02-07 DIAGNOSIS — O10013 Pre-existing essential hypertension complicating pregnancy, third trimester: Secondary | ICD-10-CM | POA: Diagnosis not present

## 2024-02-07 DIAGNOSIS — E669 Obesity, unspecified: Secondary | ICD-10-CM

## 2024-02-07 DIAGNOSIS — Z3A33 33 weeks gestation of pregnancy: Secondary | ICD-10-CM | POA: Insufficient documentation

## 2024-02-07 DIAGNOSIS — O99213 Obesity complicating pregnancy, third trimester: Secondary | ICD-10-CM | POA: Insufficient documentation

## 2024-02-07 NOTE — Progress Notes (Signed)
 "  Patient information  Patient Name: Patricia Boyer  Patient MRN:   [id number]  Referring practice: [facility] Referring Provider: [facility] - Sheronette A. Rutherford, MD  Problem List   Patient Active Problem List   Diagnosis Date Noted   IUGR (intrauterine growth restriction) affecting care of mother 02/03/2024   AMA (advanced maternal age) multigravida 35+ 02/03/2024   HTN in pregnancy, chronic 12/06/2023   [redacted] weeks gestation of pregnancy 10/06/2023   Elevated cholesterol 10/06/2023   Impacted cerumen of right ear 09/18/2023   Positive urine pregnancy test 07/31/2023   Depression 07/31/2023   Anxiety 07/31/2023   Uncontrolled hypertension 05/14/2023   Hypercholesterolemia 03/29/2022   Moderate recurrent major depression (HCC) 03/29/2022   Anxiety disorder 03/29/2022   Essential hypertension 03/29/2022   Hair loss 11/04/2021   Class 3 severe obesity due to excess calories without serious comorbidity with body mass index (BMI) of 40.0 to 44.9 in adult [facility]) 08/21/2021   Vitamin D  deficiency 08/21/2021   BMI 38.0-38.9,adult 05/17/2012    Maternal Fetal medicine Consult  EMEREE MAHLER is a 43 y.o. G3P0020 at [redacted]w[redacted]d here for ultrasound and consultation. Patricia Boyer is doing well today with no acute concerns. Today we focused on the following:   Fetal growth restriction I discussed the finding of fetal growth restriction (FGR) with the patient today. The ultrasound shows an overall growth at the <1% percentile and the abdominal circumference at the <1%. The umbilical artery Dopplers are elevated but not absent. BPP 8/8.  I counseled her about the clinical significance of the Doppler findings and antenatal testing. I discussed the various causes of growth restriction including constitutionally small fetus, placental insufficiency, genetic problems and chronic maternal disease. Currently there is no evidence of sonographic stigmata suggesting infection or aneuploidy.  I  discussed the most likely cause of her fetal growth restriction is placental insufficiency and her risk factors are AMA, elevated BMI and chronic hypertension. Genetic and infectious causes are also possible but would need amniocentesis (pt declined) or a postnatal workup. I also discussed the importance of antenatal fetal surveillance including antenatal testing and umbilical artery Doppler assessment to reduce the risk of stillbirth.  I discussed the management going forward in the pregnancy with potential alteration in the timing of delivery. Currently she feels well and denies headache, vision changes, right upper quadrant pain, contractions, vaginal bleeding or loss of fluid.  She reports good fetal movement.   Sonographic findings Single intrauterine pregnancy at 33w 6d. Fetal cardiac activity:  Observed and appears normal. Presentation: Cephalic. Interval fetal anatomy appears normal but much cannot be seen due to advanced gestational age.  Fetal biometry shows the estimated fetal weight of 3 lb 8 oz, 1588 grams (< 1%) and the abdominal circumference at the <1%.  Amniotic fluid: Within normal limits. AFI: 12.93cm.   MVP: 4.43 cm. Placenta: Anterior. Umbilical artery dopplers findings: -S/D: 4 which are elevated at this gestational age.  -Absent end-diastolic flow: No.  -Reversed end-diastolic flow:  No. BPP 8/8.    Recommendations - Weekly BPP and NST and UA dopplers with MFM - Weekly NST with Dr. Rutherford with preE labs as needed - Delivery timing should be at 36 weeks due to elevated Dopplers and EFW at < 1% to reduce the risk of stillbirth in this setting  The patient had time to ask questions that were answered to her satisfaction.  She verbalized understanding and agrees to proceed with the plan above.   I  spent 60 minutes reviewing the patients chart, including labs and images as well as counseling the patient about her medical conditions. Greater than 50% of the time was spent in  direct face-to-face patient counseling.  Delora Smaller  MFM, [place]   02/07/2024  2:37 PM   Review of Systems: A review of systems was performed and was negative except per HPI   Vitals and Physical Exam    02/07/2024    1:27 PM 02/03/2024   10:31 AM 02/03/2024   10:16 AM  Vitals with BMI  Systolic 126 143 850  Diastolic 76 96 109  Pulse 89 76 77    Sitting comfortably on the sonogram table Nonlabored breathing Normal rate and rhythm Abdomen is nontender  Past pregnancies OB History  Gravida Para Term Preterm AB Living  3    2 0  SAB IAB Ectopic Multiple Live Births   2       # Outcome Date GA Lbr Len/2nd Weight Sex Type Anes PTL Lv  3 Current           2 IAB 12/14/11          1 IAB 02/13/07             Future Appointments  Date Time Provider Department Center  09/21/2024  2:00 PM Georgina Speaks, FNP [facility] [phone] [place]      "

## 2024-02-11 ENCOUNTER — Encounter (HOSPITAL_COMMUNITY): Payer: Self-pay | Admitting: Obstetrics and Gynecology

## 2024-02-11 ENCOUNTER — Inpatient Hospital Stay (HOSPITAL_COMMUNITY)
Admission: AD | Admit: 2024-02-11 | Discharge: 2024-02-11 | Disposition: A | Discharge status: Home / Self Care | Attending: Obstetrics and Gynecology | Admitting: Obstetrics and Gynecology

## 2024-02-11 ENCOUNTER — Inpatient Hospital Stay (HOSPITAL_COMMUNITY)

## 2024-02-11 DIAGNOSIS — O99213 Obesity complicating pregnancy, third trimester: Secondary | ICD-10-CM

## 2024-02-11 DIAGNOSIS — Z363 Encounter for antenatal screening for malformations: Secondary | ICD-10-CM | POA: Diagnosis not present

## 2024-02-11 DIAGNOSIS — O10013 Pre-existing essential hypertension complicating pregnancy, third trimester: Secondary | ICD-10-CM

## 2024-02-11 DIAGNOSIS — O365930 Maternal care for other known or suspected poor fetal growth, third trimester, not applicable or unspecified: Secondary | ICD-10-CM

## 2024-02-11 DIAGNOSIS — O09523 Supervision of elderly multigravida, third trimester: Secondary | ICD-10-CM

## 2024-02-11 DIAGNOSIS — O365990 Maternal care for other known or suspected poor fetal growth, unspecified trimester, not applicable or unspecified: Secondary | ICD-10-CM

## 2024-02-11 DIAGNOSIS — Z3A34 34 weeks gestation of pregnancy: Secondary | ICD-10-CM

## 2024-02-11 DIAGNOSIS — O289 Unspecified abnormal findings on antenatal screening of mother: Secondary | ICD-10-CM | POA: Diagnosis not present

## 2024-02-11 DIAGNOSIS — E669 Obesity, unspecified: Secondary | ICD-10-CM | POA: Diagnosis not present

## 2024-02-11 NOTE — Discharge Instructions (Signed)
 Daily kick ct, freq small meals preeclampsia warning signs

## 2024-02-11 NOTE — MAU Provider Note (Signed)
 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 Swallow whole.   02/11/2024   folic acid  (FOLVITE ) 1 MG tablet Take 1 tablet (1 mg total) by mouth daily. 100 tablet 3 02/11/2024   labetalol  (NORMODYNE ) 300 MG tablet Take 1 tablet (300 mg total) by mouth 2 (two) times daily. 60 tablet 3 02/11/2024   NIFEdipine  (PROCARDIA  XL/NIFEDICAL XL) 60 MG 24 hr tablet Take 1 tablet (60 mg total) by mouth daily. 90 tablet 1 02/11/2024   Prenatal Vit-Fe Fumarate-FA (MULTIVITAMIN-PRENATAL) 27-0.8 MG TABS tablet TAKE 1 TABLET BY MOUTH DAILY AT 12 NOON. 90 tablet 1 02/11/2024   sertraline (ZOLOFT) 50 MG tablet Take 50 mg by mouth daily.   02/11/2024   Fluocinolone  Acetonide Scalp (DERMA-SMOOTHE /FS SCALP) 0.01 % OIL Apply 1 Application topically daily as needed. 118 mL 5      Physical Exam   Blood pressure 131/83, pulse 97, temperature 97.9 F (36.6 C), resp. rate 18, last menstrual period 06/15/2023.  {exam, Complete:18323} ED Course  US  MFM OB DETAIL +14 WK Result Date: 02/07/2024 ----------------------------------------------------------------------  OBSTETRICS REPORT                       (Signed Final 02/07/2024 03:08 pm) ---------------------------------------------------------------------- Patient Info  ID  #:       [id number]                          D.O.B.:  Jul 19, 1980 (43 yrs)(F)  Name:       Patricia Boyer                 Visit Date: 02/07/2024 02:29 pm ---------------------------------------------------------------------- Performed By  Attending:        Delora Smaller DO       Secondary Phy.:   Emeli Goguen MD  Performed By:     Meade Parker       Address:          Saura Silverbell                    RDMS  OB/GYN                                                             [address]                                                             [place]                                                             [address]  Referred By:      [facility] MAU/Triage         [facility] ---------------------------------------------------------------------- Orders  #  Description                           Code        Ordered By  1  US  MFM OB DETAIL +14 WK               76811.01    BURK SCHAIBLE  2  US  MFM FETAL BPP WO NON               76819.01    BURK SCHAIBLE     STRESS  3  US  MFM UA DOPPLER RE-EVAL             76820.04    BURK SCHAIBLE ----------------------------------------------------------------------  #  Order #                     Accession #                Episode #  1  [id number]                   [id number]                 [id number]  2  [id number]                   [id number]                 [id number]  3  [id number]                   [id number]                 [id number] ---------------------------------------------------------------------- Indications  [redacted] weeks gestation of pregnancy                 Z3A.60  Advanced maternal age multigravida 17+,        O70.523  third trimester  Pre-existing essential hypertension            O10.013  complicating pregnancy, third trimester  (labetalol )  Maternal care for known or suspected poor      O36.5930  fetal growth, third trimester, single or  unspecified fetus IUGR  Obesity complicating pregnancy, third          O43.213  trimester  Encounter for antenatal screening for          Z36.3  malformations ---------------------------------------------------------------------- Vital Signs  BP:          126/76 ---------------------------------------------------------------------- Fetal Evaluation  Num Of Fetuses:         1  Fetal Heart Rate(bpm):  137  Cardiac Activity:       Observed  Presentation:           Cephalic  Placenta:               Anterior  P. Cord Insertion:      Visualized, central  Amniotic Fluid  AFI FV:      Within normal limits  AFI Sum(cm)     %Tile       Largest Pocket(cm)  12.93           41          4.43  RUQ(cm)       RLQ(cm)       LUQ(cm)        LLQ(cm)  2.88          4.43          2.9            2.72 ---------------------------------------------------------------------- Biophysical Evaluation  Amniotic F.V:   Pocket => 2 cm             F. Tone:        Observed  F. Movement:    Observed                   Score:          8/8  F. Breathing:   Observed ---------------------------------------------------------------------- Biometry  BPD:      76.8  mm     G. Age:  30w 6d        < 1  %    CI:  69.89   %    70 - 86                                                          FL/HC:      20.4   %    19.4 - 21.8  HC:      293.1  mm     G. Age:  32w 2d        1.6  %    HC/AC:      1.15        0.96 - 1.11  AC:      255.1  mm     G. Age:  29w 5d        < 1  %    FL/BPD:     78.0   %    71 - 87  FL:       59.9  mm     G. Age:  31w 1d        1.4  %    FL/AC:      23.5   %    20 - 24  HUM:      54.3  mm     G. Age:  31w 4d         12  %  CER:      47.4  mm      G. Age:  35w 6d         65  %  LV:        2.6  mm  CM:        5.9  mm  Est. FW:    1588  gm      3 lb 8 oz    < 1  %  Est. FW at 39 Wks:       2324  gm     5 lb 2 oz ---------------------------------------------------------------------- OB History  Gravidity:    3         Term:   0        Prem:   0        SAB:   2  TOP:          0       Ectopic:  0        Living: 0 ---------------------------------------------------------------------- Gestational Age  LMP:           33w 6d        Date:  06/15/23                   EDD:   03/21/24  U/S Today:     31w 0d                                        EDD:   04/10/24  Best:          33w 6d     Det. By:  LMP  (06/15/23)          EDD:   03/21/24 ---------------------------------------------------------------------- Targeted Anatomy  Central Nervous System  Calvarium/Cranial V.:  Appears normal         Cereb./Vermis:  Appears normal  Cavum:                 Appears normal         Cisterna Magna:         Appears normal  Lateral Ventricles:    Appears normal         Midline Falx:           Appears normal  Choroid Plexus:        Appears normal  Spine  Cervical:              Appears normal         Sacral:                 Appears normal  Thoracic:              Appears normal         Shape/Curvature:        Appears normal  Lumbar:                Appears normal  Head/Neck  Lips:                  Appears normal         Profile:                Appears normal  Neck:                  Appears normal         Orbits/Eyes:            Not well visualized  Nuchal Fold:           Not applicable         Mandible:               Not well visualized  Nasal Bone:            Present                Maxilla:                Not well visualized  Thorax  4 Chamber View:        Not well visualized    SVC:                    Not well visualized  Cardiac Activity:      Observed               Interventr. Septum:     Not well visualized  Cardiac Rhythm:        Normal                 Cardiac Axis:           Not  well visualized  Cardiac Situs:         Not well visualized    Diaphragm:              Appears normal  Rt Outflow Tract:      Not well visualized    3 Vessel View:          Not well visualized  Lt Outflow Tract:      Not well visualized    3 V Trachea View:       Not well visualized  Aortic Arch:           Not well visualized    IVC:  Not well visualized  Ductal Arch:           Not well visualized    Crossing:               Not well visualized  Abdomen  Ventral Wall:          Not well visualized    Lt Kidney:              Appears normal  Cord Insertion:        Not well visualized    Rt Kidney:              Appears normal  Situs:                 Not well visualized    Bladder:                Appears normal  Stomach:               Appears normal  Extremities  Lt Humerus:            Appears normal         Lt Femur:               Appears normal  Rt Humerus:            Not well visualized    Rt Femur:               Appears normal  Lt Forearm:            Appears normal         Lt Lower Leg:           Appears normal  Rt Forearm:            Not well visualized    Rt Lower Leg:           Appears normal  Lt Hand:               Not well visualized    Lt Foot:                Not well visualized  Rt Hand:               Not well visualized    Rt Foot:                Not well visualized  Other  Umbilical Cord:        Not well visualized    Genitalia:              Not well visualized  Comment:     Very imited due to [facility] ---------------------------------------------------------------------- Doppler - Fetal Vessels  Umbilical Artery   S/D     %tile      RI    %tile      PI    %tile     PSV    ADFV    RDFV                                                     (cm/s)      4   > 97.5    0.66       78    1.09       86    33.69  No      No ---------------------------------------------------------------------- Myomas  Site                     L(cm)      W(cm)      D(cm)       Location  Right                    5.65        3.22       3.22                           5.65       3.22       3.22 ----------------------------------------------------------------------  Blood Flow                  RI       PI       Comments ---------------------------------------------------------------------- Comments  Sonographic findings  Single intrauterine pregnancy at 33w 6d.  Fetal cardiac activity:  Observed and appears normal.  Presentation: Cephalic.  Interval fetal anatomy appears normal.  Fetal biometry shows the estimated fetal weight of 3 lb 8 oz,  1588 grams (< 1%) and the abdominal circumference at the  <1%.  Amniotic fluid: Within normal limits. AFI: 12.93cm.   MVP:  4.43 cm.  Placenta: Anterior.  Umbilical artery dopplers findings:  -S/D: 4 which are elevated at this gestational age.  -Absent end-diastolic flow: No.  -Reversed end-diastolic flow:  No.  BPP 8/8.  Recommendations  - See Epic note for assessment and plan of care. Any  referring office that does not utilize Epic will recieve a copy of  today's consult note via fax. Please contact our office with  any concerns.  There are limitations of prenatal ultrasound such as the  inability to detect certain abnormalities due to poor  visualization. Various factors such as fetal position,  gestational age and maternal body habitus may increase the  difficulty in visualizing the fetal anatomy. ----------------------------------------------------------------------                   Delora Smaller, DO Electronically Signed Final Report   02/07/2024 03:08 pm ----------------------------------------------------------------------   US  MFM FETAL BPP WO NON STRESS Result Date: 02/07/2024 ----------------------------------------------------------------------  OBSTETRICS REPORT                       (Signed Final 02/07/2024 03:08 pm) ---------------------------------------------------------------------- Patient Info  ID #:       [id number]                          D.O.B.:  [birth year] (43 yrs)(F)   Name:       Patricia Boyer                 Visit Date: 02/07/2024 02:29 pm ---------------------------------------------------------------------- Performed By  Attending:        Delora Smaller DO       Secondary Phy.:   Jalik Gellatly MD  Performed By:     Meade Parker  Address:          [address]                    RDMS                                                             OB/GYN                                                             [address]                                                             [place]                                                             [address]  Referred By:      [facility] MAU/Triage         Location[facility]         [facility] ---------------------------------------------------------------------- Orders  #  Description                           Code        Ordered By  1  US  MFM OB DETAIL +14 WK               76811.01    BURK SCHAIBLE  2  US  MFM FETAL BPP WO NON               76819.01    BURK SCHAIBLE     STRESS  3  US  MFM UA DOPPLER RE-EVAL             76820.04    BURK SCHAIBLE ----------------------------------------------------------------------  #  Order #                     Accession #                Episode #  1  [id number]                   [id number]                 [id number]  2  [id number]                   [id number]                 [id number]  3  [id number]                   [id number]                 [id number] ---------------------------------------------------------------------- Indications  [redacted] weeks gestation of pregnancy                Z3A.110  Advanced maternal age multigravida 50+,        O10.523   third trimester  Pre-existing essential hypertension            O10.013  complicating pregnancy, third trimester  (labetalol )  Maternal care for known or suspected poor      O36.5930  fetal growth, third trimester, single or  unspecified fetus IUGR  Obesity complicating pregnancy, third          O10.213  trimester  Encounter for antenatal screening for          Z36.3  malformations ---------------------------------------------------------------------- Vital Signs  BP:          126/76 ---------------------------------------------------------------------- Fetal Evaluation  Num Of Fetuses:         1  Fetal Heart Rate(bpm):  137  Cardiac Activity:       Observed  Presentation:           Cephalic  Placenta:               Anterior  P. Cord Insertion:      Visualized, central  Amniotic Fluid  AFI FV:      Within normal limits  AFI Sum(cm)     %Tile       Largest Pocket(cm)  12.93           41          4.43  RUQ(cm)       RLQ(cm)       LUQ(cm)        LLQ(cm)  2.88          4.43          2.9            2.72 ---------------------------------------------------------------------- Biophysical Evaluation  Amniotic F.V:   Pocket => 2 cm  F. Tone:        Observed  F. Movement:    Observed                   Score:          8/8  F. Breathing:   Observed ---------------------------------------------------------------------- Biometry  BPD:      76.8  mm     G. Age:  30w 6d        < 1  %    CI:        69.89   %    70 - 86                                                          FL/HC:      20.4   %    19.4 - 21.8  HC:      293.1  mm     G. Age:  32w 2d        1.6  %    HC/AC:      1.15        0.96 - 1.11  AC:      255.1  mm     G. Age:  29w 5d        < 1  %    FL/BPD:     78.0   %    71 - 87  FL:       59.9  mm     G. Age:  31w 1d        1.4  %    FL/AC:      23.5   %    20 - 24  HUM:      54.3  mm     G. Age:  31w 4d         12  %  CER:      47.4  mm     G. Age:  35w 6d         65  %  LV:        2.6  mm  CM:        5.9  mm   Est. FW:    1588  gm      3 lb 8 oz    < 1  %  Est. FW at 39 Wks:       2324  gm     5 lb 2 oz ---------------------------------------------------------------------- OB History  Gravidity:    3         Term:   0        Prem:   0        SAB:   2  TOP:          0       Ectopic:  0        Living: 0 ---------------------------------------------------------------------- Gestational Age  LMP:           33w 6d        Date:  06/15/23                   EDD:   03/21/24  U/S Today:     31w 0d  EDD:   04/10/24  Best:          33w 6d     Det. By:  LMP  (06/15/23)          EDD:   03/21/24 ---------------------------------------------------------------------- Targeted Anatomy  Central Nervous System  Calvarium/Cranial V.:  Appears normal         Cereb./Vermis:          Appears normal  Cavum:                 Appears normal         Cisterna Magna:         Appears normal  Lateral Ventricles:    Appears normal         Midline Falx:           Appears normal  Choroid Plexus:        Appears normal  Spine  Cervical:              Appears normal         Sacral:                 Appears normal  Thoracic:              Appears normal         Shape/Curvature:        Appears normal  Lumbar:                Appears normal  Head/Neck  Lips:                  Appears normal         Profile:                Appears normal  Neck:                  Appears normal         Orbits/Eyes:            Not well visualized  Nuchal Fold:           Not applicable         Mandible:               Not well visualized  Nasal Bone:            Present                Maxilla:                Not well visualized  Thorax  4 Chamber View:        Not well visualized    SVC:                    Not well visualized  Cardiac Activity:      Observed               Interventr. Septum:     Not well visualized  Cardiac Rhythm:        Normal                 Cardiac Axis:           Not well visualized  Cardiac Situs:         Not well visualized    Diaphragm:               Appears normal  Rt Outflow Tract:      Not well visualized  3 Vessel View:          Not well visualized  Lt Outflow Tract:      Not well visualized    3 V Trachea View:       Not well visualized  Aortic Arch:           Not well visualized    IVC:                    Not well visualized  Ductal Arch:           Not well visualized    Crossing:               Not well visualized  Abdomen  Ventral Wall:          Not well visualized    Lt Kidney:              Appears normal  Cord Insertion:        Not well visualized    Rt Kidney:              Appears normal  Situs:                 Not well visualized    Bladder:                Appears normal  Stomach:               Appears normal  Extremities  Lt Humerus:            Appears normal         Lt Femur:               Appears normal  Rt Humerus:            Not well visualized    Rt Femur:               Appears normal  Lt Forearm:            Appears normal         Lt Lower Leg:           Appears normal  Rt Forearm:            Not well visualized    Rt Lower Leg:           Appears normal  Lt Hand:               Not well visualized    Lt Foot:                Not well visualized  Rt Hand:               Not well visualized    Rt Foot:                Not well visualized  Other  Umbilical Cord:        Not well visualized    Genitalia:              Not well visualized  Comment:     Very imited due to [facility] ---------------------------------------------------------------------- Doppler - Fetal Vessels  Umbilical Artery   S/D     %tile      RI    %tile      PI    %tile     PSV    ADFV    RDFV                                                     (  cm/s)      4   > 97.5    0.66       78    1.09       86    33.69      No      No ---------------------------------------------------------------------- Myomas  Site                     L(cm)      W(cm)      D(cm)       Location  Right                    5.65       3.22       3.22                           5.65       3.22       3.22  ----------------------------------------------------------------------  Blood Flow                  RI       PI       Comments ---------------------------------------------------------------------- Comments  Sonographic findings  Single intrauterine pregnancy at 33w 6d.  Fetal cardiac activity:  Observed and appears normal.  Presentation: Cephalic.  Interval fetal anatomy appears normal.  Fetal biometry shows the estimated fetal weight of 3 lb 8 oz,  1588 grams (< 1%) and the abdominal circumference at the  <1%.  Amniotic fluid: Within normal limits. AFI: 12.93cm.   MVP:  4.43 cm.  Placenta: Anterior.  Umbilical artery dopplers findings:  -S/D: 4 which are elevated at this gestational age.  -Absent end-diastolic flow: No.  -Reversed end-diastolic flow:  No.  BPP 8/8.  Recommendations  - See Epic note for assessment and plan of care. Any  referring office that does not utilize Epic will recieve a copy of  today's consult note via fax. Please contact our office with  any concerns.  There are limitations of prenatal ultrasound such as the  inability to detect certain abnormalities due to poor  visualization. Various factors such as fetal position,  gestational age and maternal body habitus may increase the  difficulty in visualizing the fetal anatomy. ----------------------------------------------------------------------                   Delora Smaller, DO Electronically Signed Final Report   02/07/2024 03:08 pm ----------------------------------------------------------------------   US  MFM UA DOPPLER RE-EVAL Result Date: 02/07/2024 ----------------------------------------------------------------------  OBSTETRICS REPORT                       (Signed Final 02/07/2024 03:08 pm) ---------------------------------------------------------------------- Patient Info  ID #:       [id number]                          D.O.B.:  1980/10/16 (43 yrs)(F)  Name:       Patricia Boyer                 Visit Date: 02/07/2024 02:29 pm  ---------------------------------------------------------------------- Performed By  Attending:        Delora Smaller DO       Secondary Phy.:   DICKIE  Finn Altemose MD  Performed By:     Meade Parker       Address:          [address]ice Pang                    RDMS                                                             OB/GYN                                                             [phone]                                                             [place]                                                             [address]  Referred By:      [facility] MAU/Triage         Location[facility]         [facility] ---------------------------------------------------------------------- Orders  #  Description  Code        Ordered By  1  US  MFM OB DETAIL +14 WK               [id number]    BURK SCHAIBLE  2  US  MFM FETAL BPP WO NON               76819.01    BURK SCHAIBLE     STRESS  3  US  MFM UA DOPPLER RE-EVAL             76820.04    BURK SCHAIBLE ----------------------------------------------------------------------  #  Order #                     Accession #                Episode #  1  [id number]                   [id number]                 [id number]  2  [id number]                   [id number]                 [id number]  3  [id number]                   [id number]                 [id number] ---------------------------------------------------------------------- Indications  [redacted] weeks gestation of pregnancy                Z3A.36  Advanced maternal age multigravida 52+,        O61.523  third trimester  Pre-existing essential hypertension            O10.013   complicating pregnancy, third trimester  (labetalol )  Maternal care for known or suspected poor      O36.5930  fetal growth, third trimester, single or  unspecified fetus IUGR  Obesity complicating pregnancy, third          O1.213  trimester  Encounter for antenatal screening for          Z36.3  malformations ---------------------------------------------------------------------- Vital Signs  BP:          126/76 ---------------------------------------------------------------------- Fetal Evaluation  Num Of Fetuses:         1  Fetal Heart Rate(bpm):  137  Cardiac Activity:       Observed  Presentation:           Cephalic  Placenta:               Anterior  P. Cord Insertion:      Visualized, central  Amniotic Fluid  AFI FV:      Within normal limits  AFI Sum(cm)     %Tile       Largest Pocket(cm)  12.93           41          4.43  RUQ(cm)       RLQ(cm)       LUQ(cm)        LLQ(cm)  2.88          4.43          2.9            2.72 ---------------------------------------------------------------------- Biophysical Evaluation  Amniotic F.V:   Pocket => 2 cm  F. Tone:        Observed  F. Movement:    Observed                   Score:          8/8  F. Breathing:   Observed ---------------------------------------------------------------------- Biometry  BPD:      76.8  mm     G. Age:  30w 6d        < 1  %    CI:        69.89   %    70 - 86                                                          FL/HC:      20.4   %    19.4 - 21.8  HC:      293.1  mm     G. Age:  32w 2d        1.6  %    HC/AC:      1.15        0.96 - 1.11  AC:      255.1  mm     G. Age:  29w 5d        < 1  %    FL/BPD:     78.0   %    71 - 87  FL:       59.9  mm     G. Age:  31w 1d        1.4  %    FL/AC:      23.5   %    20 - 24  HUM:      54.3  mm     G. Age:  31w 4d         12  %  CER:      47.4  mm     G. Age:  35w 6d         65  %  LV:        2.6  mm  CM:        5.9  mm  Est. FW:    1588  gm      3 lb 8 oz    < 1  %  Est. FW at 39 Wks:        2324  gm     5 lb 2 oz ---------------------------------------------------------------------- OB History  Gravidity:    3         Term:   0        Prem:   0        SAB:   2  TOP:          0       Ectopic:  0        Living: 0 ---------------------------------------------------------------------- Gestational Age  LMP:           33w 6d        Date:  06/15/23                   EDD:   03/21/24  U/S Today:     31w 0d  EDD:   04/10/24  Best:          33w 6d     Det. By:  LMP  (06/15/23)          EDD:   03/21/24 ---------------------------------------------------------------------- Targeted Anatomy  Central Nervous System  Calvarium/Cranial V.:  Appears normal         Cereb./Vermis:          Appears normal  Cavum:                 Appears normal         Cisterna Magna:         Appears normal  Lateral Ventricles:    Appears normal         Midline Falx:           Appears normal  Choroid Plexus:        Appears normal  Spine  Cervical:              Appears normal         Sacral:                 Appears normal  Thoracic:              Appears normal         Shape/Curvature:        Appears normal  Lumbar:                Appears normal  Head/Neck  Lips:                  Appears normal         Profile:                Appears normal  Neck:                  Appears normal         Orbits/Eyes:            Not well visualized  Nuchal Fold:           Not applicable         Mandible:               Not well visualized  Nasal Bone:            Present                Maxilla:                Not well visualized  Thorax  4 Chamber View:        Not well visualized    SVC:                    Not well visualized  Cardiac Activity:      Observed               Interventr. Septum:     Not well visualized  Cardiac Rhythm:        Normal                 Cardiac Axis:           Not well visualized  Cardiac Situs:         Not well visualized    Diaphragm:              Appears normal  Rt Outflow Tract:      Not well visualized  3 Vessel View:          Not well visualized  Lt Outflow Tract:      Not well visualized    3 V Trachea View:       Not well visualized  Aortic Arch:           Not well visualized    IVC:                    Not well visualized  Ductal Arch:           Not well visualized    Crossing:               Not well visualized  Abdomen  Ventral Wall:          Not well visualized    Lt Kidney:              Appears normal  Cord Insertion:        Not well visualized    Rt Kidney:              Appears normal  Situs:                 Not well visualized    Bladder:                Appears normal  Stomach:               Appears normal  Extremities  Lt Humerus:            Appears normal         Lt Femur:               Appears normal  Rt Humerus:            Not well visualized    Rt Femur:               Appears normal  Lt Forearm:            Appears normal         Lt Lower Leg:           Appears normal  Rt Forearm:            Not well visualized    Rt Lower Leg:           Appears normal  Lt Hand:               Not well visualized    Lt Foot:                Not well visualized  Rt Hand:               Not well visualized    Rt Foot:                Not well visualized  Other  Umbilical Cord:        Not well visualized    Genitalia:              Not well visualized  Comment:     Very imited due to [facility] ---------------------------------------------------------------------- Doppler - Fetal Vessels  Umbilical Artery   S/D     %tile      RI    %tile      PI    %tile     PSV    ADFV    RDFV                                                     (  cm/s)      4   > 97.5    0.66       78    1.09       86    33.69      No      No ---------------------------------------------------------------------- Myomas  Site                     L(cm)      W(cm)      D(cm)       Location  Right                    5.65       3.22       3.22                           5.65       3.22       3.22 ----------------------------------------------------------------------   Blood Flow                  RI       PI       Comments ---------------------------------------------------------------------- Comments  Sonographic findings  Single intrauterine pregnancy at 33w 6d.  Fetal cardiac activity:  Observed and appears normal.  Presentation: Cephalic.  Interval fetal anatomy appears normal.  Fetal biometry shows the estimated fetal weight of 3 lb 8 oz,  1588 grams (< 1%) and the abdominal circumference at the  <1%.  Amniotic fluid: Within normal limits. AFI: 12.93cm.   MVP:  4.43 cm.  Placenta: Anterior.  Umbilical artery dopplers findings:  -S/D: 4 which are elevated at this gestational age.  -Absent end-diastolic flow: No.  -Reversed end-diastolic flow:  No.  BPP 8/8.  Recommendations  - See Epic note for assessment and plan of care. Any  referring office that does not utilize Epic will recieve a copy of  today's consult note via fax. Please contact our office with  any concerns.  There are limitations of prenatal ultrasound such as the  inability to detect certain abnormalities due to poor  visualization. Various factors such as fetal position,  gestational age and maternal body habitus may increase the  difficulty in visualizing the fetal anatomy. ----------------------------------------------------------------------                   Delora Smaller, DO Electronically Signed Final Report   02/07/2024 03:08 pm ----------------------------------------------------------------------   US  MFM Fetal BPP Wo Non Stress Result Date: 02/03/2024 ----------------------------------------------------------------------  OBSTETRICS REPORT                       (Signed Final 02/03/2024 05:34 pm) ---------------------------------------------------------------------- Patient Info  ID #:       [id number]                          D.O.B.:  [birth year] (43 yrs)(F)  Name:       Patricia Boyer                 Visit Date: 02/03/2024 10:44 am ----------------------------------------------------------------------  Performed By  Attending:        Fredia Fresh MD        Secondary Phy.:   DICKIE  Lorne Winkels MD  Performed By:     Powell Breen BS       Address:          Anice Pang                    RDMS                                                             OB/GYN                                                             [phone]                                                             [place]                                                             [id number]  Referred By:      [facility] MAU/Triage         [facility] ---------------------------------------------------------------------- Orders  #  Description                           Code        Ordered By  1  US  MFM FETAL BPP WO NON               76819.01    Luca Burston     STRESS                                            Laniah Grimm  2  US  MFM UA CORD DOPPLER                76820.02    Konnor Vondrasek  Ysenia Filice ----------------------------------------------------------------------  #  Order #                     Accession #                Episode #  1  [id number]                   [id number]                 [id number]  2  [id number]                   [id number]                 [id number] ---------------------------------------------------------------------- Indications  [redacted] weeks gestation of pregnancy                Z3A.14  Advanced maternal age multigravida 68+,        O49.523  third trimester  Pre-existing essential hypertension            O10.013  complicating pregnancy, third trimester  (labetalol )  Maternal care for known or suspected poor      O36.5930  fetal growth, third trimester, single or  unspecified fetus IUGR  Encounter for antenatal screening,             Z36.9  unspecified   Obesity complicating pregnancy, third          O99.213  trimester ---------------------------------------------------------------------- Fetal Evaluation  Num Of Fetuses:         1  Fetal Heart Rate(bpm):  136  Cardiac Activity:       Observed  Presentation:           Cephalic  Placenta:               Anterior  P. Cord Insertion:      Not well visualized  Amniotic Fluid  AFI FV:      Within normal limits  AFI Sum(cm)     %Tile       Largest Pocket(cm)  10.4            21          3.6  RUQ(cm)       RLQ(cm)       LUQ(cm)        LLQ(cm)  2             1.4           3.4            3.6 ---------------------------------------------------------------------- Biophysical Evaluation  Amniotic F.V:   Pocket => 2 cm             F. Tone:        Observed  F. Movement:    Observed                   Score:          8/8  F. Breathing:   Observed ---------------------------------------------------------------------- OB History  Gravidity:    3         Term:   0        Prem:   0        SAB:   2  TOP:          0       Ectopic:  0        Living: 0 ---------------------------------------------------------------------- Gestational Age  LMP:  33w 2d        Date:  06/15/23                   EDD:   03/21/24  Best:          33w 2d     Det. By:  LMP  (06/15/23)          EDD:   03/21/24 ---------------------------------------------------------------------- Anatomy  Cranium:               Appears normal         Stomach:                Appears normal, left                                                                        sided  Ventricles:            Appears normal         Kidneys:                Appear normal  Thoracic:              Appears normal         Bladder:                Appears normal  Diaphragm:             Appears normal ---------------------------------------------------------------------- Doppler - Fetal Vessels  Umbilical Artery   S/D     %tile      RI    %tile      PI    %tile     PSV    ADFV    RDFV                                                      (cm/s)    3.4       88     0.7       88     1.2       94     35.8      No      No ---------------------------------------------------------------------- Cervix Uterus Adnexa  Cervix  Not visualized (advanced GA >24wks)  Uterus  No abnormality visualized.  Right Ovary  Not visualized.  Left Ovary  Not visualized.  Cul De Sac  No free fluid seen.  Adnexa  No abnormality visualized ---------------------------------------------------------------------- Impression  BPP and UA Doppler studies were requested by the provider  because of fetal growth restriction detected at her office.  A limited ultrasound study was performed. Normal amniotic  fluid. Umbilical artery Doppler showed normal forward  diastolic flow. BPP 8/8. Cephalic presentation. ---------------------------------------------------------------------- Recommendations  -Appointment was made for her to come to our office on  02/07/24 for detailed anatomy and antenatal testing. ----------------------------------------------------------------------                  Fredia Fresh, MD Electronically Signed Final Report   02/03/2024 05:34 pm ----------------------------------------------------------------------   US  MFM UA CORD DOPPLER Result Date: 02/03/2024 ----------------------------------------------------------------------  OBSTETRICS REPORT                       (Signed Final 02/03/2024 05:34 pm) ---------------------------------------------------------------------- Patient Info  ID #:       [id number]                          D.O.B.:  1980-07-02 (43 yrs)(F)  Name:       Patricia Boyer                 Visit Date: 02/03/2024 10:44 am ---------------------------------------------------------------------- Performed By  Attending:        Fredia Fresh MD        Secondary Phy.:   DICKIE                                                             Lauren Aguayo MD  Performed By:     Powell Breen BS       Address:          Anice Pang                     RDMS                                                             OB/GYN                                                             [address]                                                             [place]                                                             [address]  Referred By:      [facility] MAU/Triage         [facility] ---------------------------------------------------------------------- Orders  #  Description                           Code        Ordered By  1  US  MFM FETAL BPP WO NON               76819.01    Oryon Gary     STRESS                                            Chanse Kagel  2  US  MFM UA CORD DOPPLER                76820.02    Shacoya Burkhammer ----------------------------------------------------------------------  #  Order #                     Accession #                Episode #  1  [id number]                   [id number]                 [id number]  2  [id number]                   [id number]                 [id number] ---------------------------------------------------------------------- Indications  [redacted] weeks gestation of pregnancy                Z3A.67  Advanced maternal age multigravida 96+,        O51.523  third trimester  Pre-existing essential hypertension            O10.013  complicating pregnancy, third trimester  (labetalol )  Maternal care for known or suspected poor      O36.5930  fetal growth, third trimester, single or  unspecified fetus IUGR  Encounter for antenatal screening,             Z36.9  unspecified  Obesity complicating pregnancy, third          O99.213  trimester ---------------------------------------------------------------------- Fetal Evaluation  Num Of Fetuses:         1  Fetal Heart Rate(bpm):  136  Cardiac Activity:        Observed  Presentation:           Cephalic  Placenta:               Anterior  P. Cord Insertion:      Not well visualized  Amniotic Fluid  AFI FV:      Within normal limits  AFI Sum(cm)     %Tile       Largest Pocket(cm)  10.4            21          3.6  RUQ(cm)       RLQ(cm)       LUQ(cm)  LLQ(cm)  2             1.4           3.4            3.6 ---------------------------------------------------------------------- Biophysical Evaluation  Amniotic F.V:   Pocket => 2 cm             F. Tone:        Observed  F. Movement:    Observed                   Score:          8/8  F. Breathing:   Observed ---------------------------------------------------------------------- OB History  Gravidity:    3         Term:   0        Prem:   0        SAB:   2  TOP:          0       Ectopic:  0        Living: 0 ---------------------------------------------------------------------- Gestational Age  LMP:           33w 2d        Date:  06/15/23                   EDD:   03/21/24  Best:          33w 2d     Det. By:  LMP  (06/15/23)          EDD:   03/21/24 ---------------------------------------------------------------------- Anatomy  Cranium:               Appears normal         Stomach:                Appears normal, left                                                                        sided  Ventricles:            Appears normal         Kidneys:                Appear normal  Thoracic:              Appears normal         Bladder:                Appears normal  Diaphragm:             Appears normal ---------------------------------------------------------------------- Doppler - Fetal Vessels  Umbilical Artery   S/D     %tile      RI    %tile      PI    %tile     PSV    ADFV    RDFV                                                     (cm/s)    3.4  88     0.7       88     1.2       94     35.8      No      No ---------------------------------------------------------------------- Cervix Uterus Adnexa  Cervix  Not visualized  (advanced GA >24wks)  Uterus  No abnormality visualized.  Right Ovary  Not visualized.  Left Ovary  Not visualized.  Cul De Sac  No free fluid seen.  Adnexa  No abnormality visualized ---------------------------------------------------------------------- Impression  BPP and UA Doppler studies were requested by the provider  because of fetal growth restriction detected at her office.  A limited ultrasound study was performed. Normal amniotic  fluid. Umbilical artery Doppler showed normal forward  diastolic flow. BPP 8/8. Cephalic presentation. ---------------------------------------------------------------------- Recommendations  -Appointment was made for her to come to our office on  02/07/24 for detailed anatomy and antenatal testing. ----------------------------------------------------------------------                  Fredia Fresh, MD Electronically Signed Final Report   02/03/2024 05:34 pm ----------------------------------------------------------------------    MDM ***  Dickie DELENA Carder, MD 4:17 PM 02/11/2024        [1]  Social History Tobacco Use   Smoking status: Never   Smokeless tobacco: Never  Vaping Use   Vaping status: Never Used  Substance Use Topics   Alcohol use: Not Currently    Alcohol/week: 1.0 standard drink of alcohol   Drug use: No  [2] No Known Allergies

## 2024-02-11 NOTE — MAU Note (Signed)
 Sent from office for monitoring and BPP. IUGR. Pt denies any cramping or vag bleeding or leaking and reports good fetal movement

## 2024-02-21 ENCOUNTER — Ambulatory Visit: Admitting: *Deleted

## 2024-02-21 ENCOUNTER — Ambulatory Visit: Admitting: Obstetrics and Gynecology

## 2024-02-21 VITALS — BP 128/87 | HR 91

## 2024-02-21 DIAGNOSIS — O10013 Pre-existing essential hypertension complicating pregnancy, third trimester: Secondary | ICD-10-CM

## 2024-02-21 DIAGNOSIS — Z79899 Other long term (current) drug therapy: Secondary | ICD-10-CM | POA: Insufficient documentation

## 2024-02-21 DIAGNOSIS — O365930 Maternal care for other known or suspected poor fetal growth, third trimester, not applicable or unspecified: Secondary | ICD-10-CM | POA: Diagnosis not present

## 2024-02-21 DIAGNOSIS — O10919 Unspecified pre-existing hypertension complicating pregnancy, unspecified trimester: Secondary | ICD-10-CM

## 2024-02-21 DIAGNOSIS — O10913 Unspecified pre-existing hypertension complicating pregnancy, third trimester: Secondary | ICD-10-CM | POA: Insufficient documentation

## 2024-02-21 DIAGNOSIS — Z3A35 35 weeks gestation of pregnancy: Secondary | ICD-10-CM

## 2024-02-21 DIAGNOSIS — O09523 Supervision of elderly multigravida, third trimester: Secondary | ICD-10-CM | POA: Diagnosis not present

## 2024-02-21 NOTE — Procedures (Deleted)
 Patricia Boyer [birth year] [redacted]w[redacted]d  Fetus A Non-Stress Test Interpretation for 02/21/2024- nst  Indication: IUGR, Chronic Hypertension, Advanced Maternal Age >40 years, and preg obese  Fetal Heart Rate A Mode: External Baseline Rate (A): 140 bpm Variability: Moderate Accelerations: 10 x 10 Decelerations: None Multiple birth?: No  Uterine Activity Mode: Toco Contraction Frequency (min): none Resting Tone Palpated: Relaxed  Interpretation (Fetal Testing) Nonstress Test Interpretation: Non-reactive Comments: Tracing reviewed by Dr. Arna; Dr Arna performed a modified BPP at beside.

## 2024-02-21 NOTE — Progress Notes (Signed)
 Maternal-Fetal Medicine Consultation  Name: Patricia Boyer  MRN: [id number]  GA: [id number] [redacted]w[redacted]d   Patient is here for antenatal testing.  She had the following high risk pregnancy problems: - Severe fetal growth restriction.  On ultrasound performed 2 weeks ago, the estimated fetal weight and the abdominal circumference measurements where at the first percentiles. - Chronic hypertension.  Patient takes labetalol  and her blood pressure today at our office is 128/87 mmHg.  She does not have signs and symptoms of severe features of preeclampsia. - Advanced maternal age (43 years at delivery).  On cell-free fetal DNA screening, the risks of fetal aneuploidies are not increased. - Pregravid BMI 43.  NST was not reactive after 40 minutes of monitoring.  Good variability is seen.  See NST report. I performed a bedside ultrasound.  Amniotic fluid is (AFI 6.1 cm).  Cephalic presentation.  Umbilical artery Doppler showed increased S/D ratio (4).  BPP 8/10.  I counseled the patient on the findings.  I explained that she has 4 high risk pregnancy problems.  Two other complicating factors include low amniotic fluid and a nonreactive NST.  A BPP score of 8/10 is associated with a lower incidence of perinatal mortality in 1 week (1 per thousand).  However, because of additional risk factors, perinatal mortality is increased.  Consistent with ACOG guidelines, I have recommended a nonurgent delivery at [redacted] weeks gestation.  Delivery at [redacted] weeks gestation is likely to carry a slightly higher risk of neonatal morbidity (respiratory distress syndrome).  Risk of delivery at this gestational age seems to outweigh the risk of stillbirth if pregnancy is prolonged.  Patient agreed with my recommendations.  I will be communicating the findings to Dr. Rutherford.  Recommendations -Non-urgent delivery at 36 weeks.     Consultation including face-to-face (more than 50%) counseling 30 minutes.

## 2024-02-21 NOTE — Procedures (Signed)
 GIULIANA HANDYSIDE December 14, 1980 [redacted]w[redacted]d  Fetus A Non-Stress Test Interpretation for 02/21/2024  Indication: IUGR, Chronic Hypertension, Advanced Maternal Age >40 years, and preg obese  Fetal Heart Rate A Mode: External Baseline Rate (A): 140 bpm Variability: Moderate Accelerations: 10 x 10 Decelerations: None Multiple birth?: No  Uterine Activity Mode: Toco Contraction Frequency (min): none Resting Tone Palpated: Relaxed  Interpretation (Fetal Testing) Nonstress Test Interpretation: Non-reactive Comments: Tracing reviewed by Dr. Arna; Dr Arna performed a modified BPP at beside.

## 2024-02-21 NOTE — Procedures (Deleted)
 Patricia Boyer 18-Dec-1980 [redacted]w[redacted]d  Fetus A Non-Stress Test Interpretation for 02/21/2024 - NST with modified BPP  Indication: IUGR, Chronic Hypertension, Advanced Maternal Age >40 years, and pregnant obesity  Fetal Heart Rate A Mode: External Baseline Rate (A): 140 bpm Variability: Moderate Accelerations: 10 x 10 Decelerations: None Multiple birth?: No  Uterine Activity Mode: Toco Contraction Frequency (min): none Resting Tone Palpated: Relaxed  Interpretation (Fetal Testing) Nonstress Test Interpretation: Non-reactive Comments: Tracing reviewed by Dr. Arna; Dr Arna performed a modified BPP at beside.

## 2024-02-21 NOTE — Procedures (Deleted)
 SHENIA ALAN [birth year] [redacted]w[redacted]d  Fetus A Non-Stress Test Interpretation for 02/21/2024-NST only  Indication: IUGR, Chronic Hypertension, Advanced Maternal Age >40 years, and preg obesity  Fetal Heart Rate A Mode: External Baseline Rate (A): 140 bpm Variability: Moderate Accelerations: 10 x 10 Decelerations: None Multiple birth?: No  Uterine Activity Mode: Toco Contraction Frequency (min): none Resting Tone Palpated: Relaxed  Interpretation (Fetal Testing) Nonstress Test Interpretation: Non-reactive Comments: Tracing reviewed by Dr. Arna; Dr Arna performed a modified BPP at beside.

## 2024-02-22 ENCOUNTER — Inpatient Hospital Stay (HOSPITAL_COMMUNITY)
Admission: RE | Admit: 2024-02-22 | Discharge: 2024-02-26 | DRG: 787 | Disposition: A | Discharge status: Home / Self Care | Attending: Obstetrics and Gynecology | Admitting: Obstetrics and Gynecology

## 2024-02-22 ENCOUNTER — Encounter (HOSPITAL_COMMUNITY): Payer: Self-pay

## 2024-02-22 ENCOUNTER — Inpatient Hospital Stay (HOSPITAL_COMMUNITY)

## 2024-02-22 ENCOUNTER — Encounter (HOSPITAL_COMMUNITY): Payer: Self-pay | Admitting: Obstetrics and Gynecology

## 2024-02-22 ENCOUNTER — Other Ambulatory Visit: Payer: Self-pay

## 2024-02-22 DIAGNOSIS — Z8249 Family history of ischemic heart disease and other diseases of the circulatory system: Secondary | ICD-10-CM | POA: Diagnosis not present

## 2024-02-22 DIAGNOSIS — O365990 Maternal care for other known or suspected poor fetal growth, unspecified trimester, not applicable or unspecified: Secondary | ICD-10-CM | POA: Diagnosis present

## 2024-02-22 DIAGNOSIS — E66813 Obesity, class 3: Secondary | ICD-10-CM | POA: Diagnosis present

## 2024-02-22 DIAGNOSIS — D251 Intramural leiomyoma of uterus: Secondary | ICD-10-CM | POA: Diagnosis present

## 2024-02-22 DIAGNOSIS — O3413 Maternal care for benign tumor of corpus uteri, third trimester: Secondary | ICD-10-CM | POA: Diagnosis present

## 2024-02-22 DIAGNOSIS — O1092 Unspecified pre-existing hypertension complicating childbirth: Secondary | ICD-10-CM | POA: Diagnosis present

## 2024-02-22 DIAGNOSIS — O1404 Mild to moderate pre-eclampsia, complicating childbirth: Secondary | ICD-10-CM | POA: Diagnosis not present

## 2024-02-22 DIAGNOSIS — F32A Depression, unspecified: Secondary | ICD-10-CM | POA: Diagnosis present

## 2024-02-22 DIAGNOSIS — Z833 Family history of diabetes mellitus: Secondary | ICD-10-CM | POA: Diagnosis not present

## 2024-02-22 DIAGNOSIS — Z79899 Other long term (current) drug therapy: Secondary | ICD-10-CM

## 2024-02-22 DIAGNOSIS — O99214 Obesity complicating childbirth: Secondary | ICD-10-CM | POA: Diagnosis present

## 2024-02-22 DIAGNOSIS — O99824 Streptococcus B carrier state complicating childbirth: Secondary | ICD-10-CM | POA: Diagnosis present

## 2024-02-22 DIAGNOSIS — Z3A36 36 weeks gestation of pregnancy: Secondary | ICD-10-CM | POA: Diagnosis not present

## 2024-02-22 DIAGNOSIS — O114 Pre-existing hypertension with pre-eclampsia, complicating childbirth: Secondary | ICD-10-CM | POA: Diagnosis present

## 2024-02-22 DIAGNOSIS — O99344 Other mental disorders complicating childbirth: Secondary | ICD-10-CM | POA: Diagnosis present

## 2024-02-22 DIAGNOSIS — O365930 Maternal care for other known or suspected poor fetal growth, third trimester, not applicable or unspecified: Secondary | ICD-10-CM | POA: Diagnosis present

## 2024-02-22 DIAGNOSIS — Z3490 Encounter for supervision of normal pregnancy, unspecified, unspecified trimester: Principal | ICD-10-CM

## 2024-02-22 LAB — COMPREHENSIVE METABOLIC PANEL WITH GFR
ALT: 11 U/L (ref 0–44)
AST: 19 U/L (ref 15–41)
Albumin: 3.5 g/dL (ref 3.5–5.0)
Alkaline Phosphatase: 139 U/L — ABNORMAL HIGH (ref 38–126)
Anion gap: 10 (ref 5–15)
BUN: 13 mg/dL (ref 6–20)
CO2: 20 mmol/L — ABNORMAL LOW (ref 22–32)
Calcium: 9.2 mg/dL (ref 8.9–10.3)
Chloride: 107 mmol/L (ref 98–111)
Creatinine, Ser: 0.72 mg/dL (ref 0.44–1.00)
GFR, Estimated: >60 mL/min
Glucose, Bld: 74 mg/dL (ref 70–99)
Potassium: 3.8 mmol/L (ref 3.5–5.1)
Sodium: 136 mmol/L (ref 135–145)
Total Bilirubin: 0.4 mg/dL (ref 0.0–1.2)
Total Protein: 6.9 g/dL (ref 6.5–8.1)

## 2024-02-22 LAB — CBC
HCT: 36.1 % (ref 36.0–46.0)
Hemoglobin: 12.7 g/dL (ref 12.0–15.0)
MCH: 32.6 pg (ref 26.0–34.0)
MCHC: 35.2 g/dL (ref 30.0–36.0)
MCV: 92.8 fL (ref 80.0–100.0)
Platelets: 288 K/uL (ref 150–400)
RBC: 3.89 MIL/uL (ref 3.87–5.11)
RDW: 13.2 % (ref 11.5–15.5)
WBC: 7.5 K/uL (ref 4.0–10.5)
nRBC: 0 % (ref 0.0–0.2)

## 2024-02-22 LAB — PROTEIN / CREATININE RATIO, URINE
Creatinine, Urine: 109 mg/dL
Protein Creatinine Ratio: 0.1 mg/mg
Total Protein, Urine: 12 mg/dL

## 2024-02-22 LAB — TYPE AND SCREEN
ABO/RH(D): B POS
Antibody Screen: NEGATIVE

## 2024-02-22 LAB — OB RESULTS CONSOLE HIV ANTIBODY (ROUTINE TESTING): HIV: NONREACTIVE

## 2024-02-22 LAB — OB RESULTS CONSOLE RUBELLA ANTIBODY, IGM: Rubella: IMMUNE

## 2024-02-22 LAB — OB RESULTS CONSOLE HEPATITIS B SURFACE ANTIGEN: Hepatitis B Surface Ag: NEGATIVE

## 2024-02-22 LAB — SYPHILIS: RPR W/REFLEX TO RPR TITER AND TREPONEMAL ANTIBODIES, TRADITIONAL SCREENING AND DIAGNOSIS ALGORITHM: RPR Ser Ql: NONREACTIVE

## 2024-02-22 MED ORDER — SERTRALINE HCL 100 MG PO TABS
100.0000 mg | ORAL_TABLET | Freq: Every day | ORAL | Status: DC
  Administered 2024-02-22 – 2024-02-23 (×2): 100 mg via ORAL
  Filled 2024-02-22 (×2): qty 1

## 2024-02-22 MED ORDER — OXYTOCIN BOLUS FROM INFUSION: 333.0000 mL | Freq: Once | INTRAVENOUS | Status: DC

## 2024-02-22 MED ORDER — LABETALOL HCL 200 MG PO TABS
300.0000 mg | ORAL_TABLET | Freq: Two times a day (BID) | ORAL | Status: DC
  Administered 2024-02-22 – 2024-02-26 (×8): 300 mg via ORAL
  Filled 2024-02-22 (×10): qty 1

## 2024-02-22 MED ORDER — OXYTOCIN-SODIUM CHLORIDE 30-0.9 UT/500ML-% IV SOLN: 2.5000 [IU]/h | INTRAVENOUS | Status: DC

## 2024-02-22 MED ORDER — MISOPROSTOL 50MCG HALF TABLET
50.0000 ug | ORAL_TABLET | ORAL | Status: DC | PRN
  Administered 2024-02-22 – 2024-02-23 (×5): 50 ug via ORAL
  Filled 2024-02-22 (×5): qty 1

## 2024-02-22 MED ORDER — ONDANSETRON HCL 4 MG/2ML IJ SOLN
4.0000 mg | Freq: Four times a day (QID) | INTRAMUSCULAR | Status: DC | PRN
  Administered 2024-02-23: 4 mg via INTRAVENOUS
  Filled 2024-02-22: qty 2

## 2024-02-22 MED ORDER — TERBUTALINE SULFATE 1 MG/ML IJ SOLN: 0.2500 mg | Freq: Once | INTRAMUSCULAR | Status: DC | PRN

## 2024-02-22 MED ORDER — LIDOCAINE HCL (PF) 1 % IJ SOLN: 30.0000 mL | INTRAMUSCULAR | Status: DC | PRN

## 2024-02-22 MED ORDER — LACTATED RINGERS IV SOLN
500.0000 mL | INTRAVENOUS | Status: AC | PRN
  Administered 2024-02-22: 500 mL via INTRAVENOUS

## 2024-02-22 MED ORDER — LACTATED RINGERS IV SOLN: INTRAVENOUS | Status: AC

## 2024-02-22 MED ORDER — SOD CITRATE-CITRIC ACID 500-334 MG/5ML PO SOLN: 30.0000 mL | ORAL | Status: DC | PRN

## 2024-02-22 MED ORDER — SODIUM CHLORIDE 0.9 % IV SOLN
5.0000 10*6.[IU] | Freq: Once | INTRAVENOUS | Status: AC
  Administered 2024-02-22: 5 10*6.[IU] via INTRAVENOUS
  Filled 2024-02-22: qty 5

## 2024-02-22 MED ORDER — MISOPROSTOL 25 MCG QUARTER TABLET
25.0000 ug | ORAL_TABLET | Freq: Once | ORAL | Status: AC
  Administered 2024-02-22: 25 ug via VAGINAL
  Filled 2024-02-22: qty 1

## 2024-02-22 MED ORDER — MISOPROSTOL 50MCG HALF TABLET
50.0000 ug | ORAL_TABLET | Freq: Once | ORAL | Status: AC
  Administered 2024-02-22: 50 ug via ORAL
  Filled 2024-02-22: qty 1

## 2024-02-22 MED ORDER — PENICILLIN G POT IN DEXTROSE 60000 UNIT/ML IV SOLN
3.0000 10*6.[IU] | INTRAVENOUS | Status: DC
  Administered 2024-02-22 – 2024-02-23 (×5): 3 10*6.[IU] via INTRAVENOUS
  Filled 2024-02-22 (×7): qty 50

## 2024-02-22 MED ORDER — ACETAMINOPHEN 325 MG PO TABS
650.0000 mg | ORAL_TABLET | ORAL | Status: DC | PRN
  Administered 2024-02-23 (×2): 650 mg via ORAL
  Filled 2024-02-22 (×2): qty 2

## 2024-02-22 MED ORDER — NIFEDIPINE ER OSMOTIC RELEASE 30 MG PO TB24
60.0000 mg | ORAL_TABLET | Freq: Every day | ORAL | Status: DC
  Administered 2024-02-24 – 2024-02-26 (×3): 60 mg via ORAL
  Filled 2024-02-22 (×4): qty 2

## 2024-02-22 NOTE — H&P (Signed)
 Patricia Boyer is a 44 y.o. female presenting for IOL @ 36 wk per MFM due to severe IUGR and chronic HTN . 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 Social History:  reports that she has never smoked. She has never used smokeless tobacco. She reports that she does not currently use alcohol after a past usage of about 1.0 standard drink of alcohol per week. She reports that she does not use drugs.     Maternal Diabetes: No Genetic Screening: Normal Maternal Ultrasounds/Referrals: IUGR Fetal Ultrasounds or other Referrals:  Referred to Materal Fetal Medicine  Maternal Substance Abuse:  No Significant Maternal Medications:  Meds include: Other:  Significant Maternal Lab Results:  Other: zoloft , procardia  xl, labetalol  Number of Prenatal Visits:greater than 3 verified prenatal visits Maternal Vaccinations:TDap and Flu Other Comments:  chronic HTN, depression, obesity  Review of Systems History Dilation: Closed Effacement (%): Thick Station: Ballotable Exam by:: Patricia Rink, RN Blood pressure (!) 137/95, pulse 78, temperature 98.1 F (36.7 C), height 5' 8 (1.727 m), weight 135.5 kg, last menstrual period 06/15/2023, SpO2 99%. Exam Physical Exam Constitutional:      Appearance: Normal appearance. She is obese.  HENT:     Head: Atraumatic.  Eyes:     Extraocular Movements: Extraocular  movements intact.  Cardiovascular:     Rate and Rhythm: Regular rhythm.     Heart sounds: Normal heart sounds.  Pulmonary:     Breath sounds: Normal breath sounds.  Abdominal:     Palpations: Abdomen is soft.  Musculoskeletal:        General: Normal range of motion.     Cervical back: Neck supple.  Skin:    General: Skin is warm and dry.  Neurological:     General: No focal deficit present.     Mental Status: She is alert and oriented to person, place, and time.  Psychiatric:        Mood and Affect: Mood normal.        Behavior: Behavior normal.     Prenatal labs: ABO, Rh: --/--/B POS (01/10 9175) Antibody: NEG (01/10 0824) Rubella: NONIMMUNE RPR:   NR HBsAg:   negative HIV:   neg GBS:   pending  Assessment/Plan: IUGR Chronic HTN affecting pregnancy Obesity affecting pregnancy Depression  IUP @ 36 wk P) admit  PIH labs. Continue Zoloft , procardia  and labetalol . Cytotec  for cervical ripening. Await GBS cx result.    Patricia Boyer A Patricia Boyer 02/22/2024, 10:34 AM

## 2024-02-22 NOTE — Progress Notes (Signed)
 Patricia Boyer is a 44 y.o. G3P0020 at [redacted]w[redacted]d by LMP admitted for induction of labor due to IUGR, chronic HTn.  Subjective: S/p cytotec  Notes cramping  Objective: BP (!) 149/101   Pulse 84   Temp 98.1 F (36.7 C) (Oral)   Resp 15   Ht 5' 8 (1.727 m)   Wt 135.5 kg   LMP 06/15/2023   SpO2 98%   BMI 45.43 kg/m  No intake/output data recorded. No intake/output data recorded.  FHT:  FHR: 135 bpm, variability: moderate,  accelerations:  Present,  decelerations:  Present occ variable UC:   irregular, every 2-4 minutes SVE:   deferred   Labs: Lab Results  Component Value Date   WBC 7.5 02/22/2024   HGB 12.7 02/22/2024   HCT 36.1 02/22/2024   MCV 92.8 02/22/2024   PLT 288 02/22/2024  Urine PCR 0.1  Assessment / Plan: IUGR Chronic HTN on labetalol  , porcardia Depression on Zoloft  Obesity affecting pregnancy P) start IV PCN ( gbs cx still pending). Continue cervical ripening   Anticipated MOD:  guarded  Sathvik Tiedt A Akeisha Lagerquist 02/22/2024, 5:56 PM

## 2024-02-23 ENCOUNTER — Inpatient Hospital Stay (HOSPITAL_COMMUNITY): Admitting: Anesthesiology

## 2024-02-23 ENCOUNTER — Encounter (HOSPITAL_COMMUNITY): Payer: Self-pay | Admitting: Obstetrics and Gynecology

## 2024-02-23 ENCOUNTER — Encounter (HOSPITAL_COMMUNITY): Admission: RE | Disposition: A | Payer: Self-pay | Source: Home / Self Care | Attending: Obstetrics and Gynecology

## 2024-02-23 DIAGNOSIS — O365930 Maternal care for other known or suspected poor fetal growth, third trimester, not applicable or unspecified: Secondary | ICD-10-CM | POA: Diagnosis not present

## 2024-02-23 DIAGNOSIS — O99214 Obesity complicating childbirth: Secondary | ICD-10-CM | POA: Diagnosis not present

## 2024-02-23 DIAGNOSIS — Z3A36 36 weeks gestation of pregnancy: Secondary | ICD-10-CM

## 2024-02-23 DIAGNOSIS — O1404 Mild to moderate pre-eclampsia, complicating childbirth: Secondary | ICD-10-CM | POA: Diagnosis not present

## 2024-02-23 LAB — CBC
HCT: 34.8 % — ABNORMAL LOW (ref 36.0–46.0)
Hemoglobin: 12.1 g/dL (ref 12.0–15.0)
MCH: 32 pg (ref 26.0–34.0)
MCHC: 34.8 g/dL (ref 30.0–36.0)
MCV: 92.1 fL (ref 80.0–100.0)
Platelets: 252 K/uL (ref 150–400)
RBC: 3.78 MIL/uL — ABNORMAL LOW (ref 3.87–5.11)
RDW: 13.1 % (ref 11.5–15.5)
WBC: 9.7 K/uL (ref 4.0–10.5)
nRBC: 0 % (ref 0.0–0.2)

## 2024-02-23 LAB — COMPREHENSIVE METABOLIC PANEL WITH GFR
ALT: 12 U/L (ref 0–44)
AST: 20 U/L (ref 15–41)
Albumin: 3.5 g/dL (ref 3.5–5.0)
Alkaline Phosphatase: 148 U/L — ABNORMAL HIGH (ref 38–126)
Anion gap: 10 (ref 5–15)
BUN: 7 mg/dL (ref 6–20)
CO2: 19 mmol/L — ABNORMAL LOW (ref 22–32)
Calcium: 8.8 mg/dL — ABNORMAL LOW (ref 8.9–10.3)
Chloride: 104 mmol/L (ref 98–111)
Creatinine, Ser: 0.65 mg/dL (ref 0.44–1.00)
GFR, Estimated: >60 mL/min
Glucose, Bld: 78 mg/dL (ref 70–99)
Potassium: 4 mmol/L (ref 3.5–5.1)
Sodium: 132 mmol/L — ABNORMAL LOW (ref 135–145)
Total Bilirubin: 0.6 mg/dL (ref 0.0–1.2)
Total Protein: 6.9 g/dL (ref 6.5–8.1)

## 2024-02-23 LAB — PROTEIN / CREATININE RATIO, URINE
Creatinine, Urine: 153 mg/dL
Protein Creatinine Ratio: 0.3 mg/mg — ABNORMAL HIGH
Total Protein, Urine: 43 mg/dL

## 2024-02-23 MED ORDER — SODIUM CHLORIDE 0.9 % IV SOLN
INTRAVENOUS | Status: DC | PRN
  Administered 2024-02-23: 500 mg via INTRAVENOUS

## 2024-02-23 MED ORDER — ONDANSETRON HCL 4 MG/2ML IJ SOLN
INTRAMUSCULAR | Status: DC | PRN
  Administered 2024-02-23: 4 mg via INTRAVENOUS

## 2024-02-23 MED ORDER — LIDOCAINE HCL (PF) 1 % IJ SOLN
INTRAMUSCULAR | Status: DC | PRN
  Administered 2024-02-23: 5 mL via EPIDURAL

## 2024-02-23 MED ORDER — ACETAMINOPHEN 10 MG/ML IV SOLN
1000.0000 mg | Freq: Once | INTRAVENOUS | Status: DC | PRN
  Administered 2024-02-23: 1000 mg via INTRAVENOUS

## 2024-02-23 MED ORDER — FENTANYL-BUPIVACAINE-NACL 0.5-0.125-0.9 MG/250ML-% EP SOLN
12.0000 mL/h | EPIDURAL | Status: DC | PRN
  Administered 2024-02-23: 12 mL/h via EPIDURAL
  Filled 2024-02-23: qty 250

## 2024-02-23 MED ORDER — METOCLOPRAMIDE HCL 5 MG/ML IJ SOLN
INTRAMUSCULAR | Status: DC | PRN
  Administered 2024-02-23: 5 mg via INTRAVENOUS

## 2024-02-23 MED ORDER — PHENYLEPHRINE 80 MCG/ML (10ML) SYRINGE FOR IV PUSH (FOR BLOOD PRESSURE SUPPORT): 80.0000 ug | PREFILLED_SYRINGE | INTRAVENOUS | Status: DC | PRN

## 2024-02-23 MED ORDER — DIPHENHYDRAMINE HCL 50 MG/ML IJ SOLN: 12.5000 mg | INTRAMUSCULAR | Status: DC | PRN

## 2024-02-23 MED ORDER — DEXTROSE 5 % IV SOLN
INTRAVENOUS | Status: DC | PRN
  Administered 2024-02-23: 3 g via INTRAVENOUS

## 2024-02-23 MED ORDER — PHENYLEPHRINE 80 MCG/ML (10ML) SYRINGE FOR IV PUSH (FOR BLOOD PRESSURE SUPPORT)
80.0000 ug | PREFILLED_SYRINGE | INTRAVENOUS | Status: DC | PRN
  Filled 2024-02-23: qty 10

## 2024-02-23 MED ORDER — FENTANYL CITRATE (PF) 100 MCG/2ML IJ SOLN
100.0000 ug | Freq: Once | INTRAMUSCULAR | Status: AC
  Administered 2024-02-23: 100 ug via INTRAVENOUS

## 2024-02-23 MED ORDER — LACTATED RINGERS AMNIOINFUSION: INTRAVENOUS | Status: DC

## 2024-02-23 MED ORDER — LACTATED RINGERS IV SOLN: INTRAVENOUS | Status: DC | PRN

## 2024-02-23 MED ORDER — KETAMINE HCL 10 MG/ML IJ SOLN
INTRAMUSCULAR | Status: DC | PRN
  Administered 2024-02-23 (×2): 10 mg via INTRAVENOUS

## 2024-02-23 MED ORDER — AMISULPRIDE (ANTIEMETIC) 5 MG/2ML IV SOLN: 10.0000 mg | Freq: Once | INTRAVENOUS | Status: DC | PRN

## 2024-02-23 MED ORDER — FENTANYL CITRATE (PF) 100 MCG/2ML IJ SOLN
INTRAMUSCULAR | Status: AC
  Filled 2024-02-23: qty 2

## 2024-02-23 MED ORDER — OXYCODONE HCL 5 MG PO TABS: 5.0000 mg | ORAL_TABLET | Freq: Once | ORAL | Status: DC | PRN

## 2024-02-23 MED ORDER — LACTATED RINGERS IV SOLN
500.0000 mL | Freq: Once | INTRAVENOUS | Status: AC
  Administered 2024-02-23: 500 mL via INTRAVENOUS

## 2024-02-23 MED ORDER — MORPHINE SULFATE (PF) 0.5 MG/ML IJ SOLN
INTRAMUSCULAR | Status: DC | PRN
  Administered 2024-02-23: 3 mg via EPIDURAL

## 2024-02-23 MED ORDER — LABETALOL HCL 5 MG/ML IV SOLN
20.0000 mg | INTRAVENOUS | Status: DC | PRN
  Administered 2024-02-23: 20 mg via INTRAVENOUS
  Filled 2024-02-23: qty 4

## 2024-02-23 MED ORDER — DEXMEDETOMIDINE HCL IN NACL 80 MCG/20ML IV SOLN
INTRAVENOUS | Status: DC | PRN
  Administered 2024-02-23: 8 ug via INTRAVENOUS

## 2024-02-23 MED ORDER — LIDOCAINE-EPINEPHRINE (PF) 1.5 %-1:200000 IJ SOLN
INTRAMUSCULAR | Status: DC | PRN
  Administered 2024-02-23: 5 mL via EPIDURAL

## 2024-02-23 MED ORDER — LIDOCAINE-EPINEPHRINE (PF) 2 %-1:200000 IJ SOLN
INTRAMUSCULAR | Status: DC | PRN
  Administered 2024-02-23: 5 mL via EPIDURAL
  Administered 2024-02-23: 10 mL via EPIDURAL
  Administered 2024-02-23: 5 mL via EPIDURAL

## 2024-02-23 MED ORDER — KETOROLAC TROMETHAMINE 30 MG/ML IJ SOLN
INTRAMUSCULAR | Status: AC
  Filled 2024-02-23: qty 1

## 2024-02-23 MED ORDER — EPHEDRINE 5 MG/ML INJ: 10.0000 mg | INTRAVENOUS | Status: DC | PRN

## 2024-02-23 MED ORDER — LACTATED RINGERS IV SOLN: INTRAVENOUS | Status: DC

## 2024-02-23 MED ORDER — MEPERIDINE HCL 25 MG/ML IJ SOLN: 6.2500 mg | INTRAMUSCULAR | Status: DC | PRN

## 2024-02-23 MED ORDER — LABETALOL HCL 5 MG/ML IV SOLN
80.0000 mg | INTRAVENOUS | Status: DC | PRN
  Filled 2024-02-23: qty 16

## 2024-02-23 MED ORDER — SODIUM CHLORIDE 0.9 % IR SOLN
Status: DC | PRN
  Administered 2024-02-23: 1

## 2024-02-23 MED ORDER — OXYTOCIN-SODIUM CHLORIDE 30-0.9 UT/500ML-% IV SOLN
1.0000 m[IU]/min | INTRAVENOUS | Status: DC
  Administered 2024-02-23 (×2): 2 m[IU]/min via INTRAVENOUS
  Filled 2024-02-23: qty 500

## 2024-02-23 MED ORDER — DEXAMETHASONE SOD PHOSPHATE PF 10 MG/ML IJ SOLN
INTRAMUSCULAR | Status: DC | PRN
  Administered 2024-02-23: 10 mg via INTRAVENOUS

## 2024-02-23 MED ORDER — MORPHINE SULFATE (PF) 0.5 MG/ML IJ SOLN
INTRAMUSCULAR | Status: AC
  Filled 2024-02-23: qty 10

## 2024-02-23 MED ORDER — OXYCODONE HCL 5 MG/5ML PO SOLN: 5.0000 mg | Freq: Once | ORAL | Status: DC | PRN

## 2024-02-23 MED ORDER — KETOROLAC TROMETHAMINE 30 MG/ML IJ SOLN
30.0000 mg | Freq: Once | INTRAMUSCULAR | Status: AC | PRN
  Administered 2024-02-23: 30 mg via INTRAVENOUS

## 2024-02-23 MED ORDER — LABETALOL HCL 5 MG/ML IV SOLN
40.0000 mg | INTRAVENOUS | Status: DC | PRN
  Administered 2024-02-23: 40 mg via INTRAVENOUS
  Filled 2024-02-23: qty 8

## 2024-02-23 MED ORDER — FENTANYL CITRATE (PF) 100 MCG/2ML IJ SOLN
INTRAMUSCULAR | Status: DC | PRN
  Administered 2024-02-23: 100 ug via EPIDURAL

## 2024-02-23 MED ORDER — EPHEDRINE 5 MG/ML INJ
10.0000 mg | INTRAVENOUS | Status: DC | PRN
  Filled 2024-02-23: qty 5

## 2024-02-23 MED ORDER — KETAMINE HCL 50 MG/5ML IJ SOSY
PREFILLED_SYRINGE | INTRAMUSCULAR | Status: AC
  Filled 2024-02-23: qty 5

## 2024-02-23 MED ORDER — HYDRALAZINE HCL 20 MG/ML IJ SOLN: 10.0000 mg | INTRAMUSCULAR | Status: DC | PRN

## 2024-02-23 MED ORDER — FENTANYL CITRATE (PF) 100 MCG/2ML IJ SOLN: 25.0000 ug | INTRAMUSCULAR | Status: DC | PRN

## 2024-02-23 MED ORDER — OXYTOCIN-SODIUM CHLORIDE 30-0.9 UT/500ML-% IV SOLN
INTRAVENOUS | Status: DC | PRN
  Administered 2024-02-23: 300 mL via INTRAVENOUS

## 2024-02-23 NOTE — Progress Notes (Signed)
 Patricia Boyer is a 44 y.o. G3P0020 at [redacted]w[redacted]d by LMP admitted for induction of labor due to IUGR.  Subjective: Comfortable epidural  Objective: BP 138/82   Pulse (!) 53   Temp 97.8 F (36.6 C) (Oral)   Resp 18   Ht 5' 8 (1.727 m)   Wt 135.5 kg   LMP 06/15/2023   SpO2 98%   BMI 45.43 kg/m  I/O last 3 completed shifts: In: -  Out: 1800 [Urine:1800] No intake/output data recorded.  FHT:  FHR: 125 bpm, variability: moderate,  accelerations:  Present,  decelerations:  Present early decelerations, variable deceleration UC:   irregular, every 2-4 minutes SVE:   6 cm dilated, 70%  effaced, -2 station   Labs: Lab Results  Component Value Date   WBC 9.7 02/23/2024   HGB 12.1 02/23/2024   HCT 34.8 (L) 02/23/2024   MCV 92.1 02/23/2024   PLT 252 02/23/2024    Assessment / Plan: Variable deceleration Severe IUGR Chronic HTN with superimposed preeclampsia Derpression  Obesity affecting pregnancy Disc with pt cervical exam not as previously described which means may need c/s for arrest of dilation Pt express to RN she did not want a C/S P) pitocin  remain off  while I step out of the hospital.  Amnioinfusion. Continue positional changes Anticipated MOD:  guarded  Ziyan Hillmer A Flor Houdeshell 02/23/2024, 8:02 PM

## 2024-02-23 NOTE — Progress Notes (Signed)
 Patricia Boyer is a 44 y.o. G3P0020 at [redacted]w[redacted]d by LMP admitted for induction of labor due to IUGR, chronic HTN. Asked to review tracing  Subjective: Confortable Pitocin  4 MIU  Objective: BP (!) 144/112   Pulse (!) 55   Temp (!) 97.5 F (36.4 C) (Oral)   Resp 18   Ht 5' 8 (1.727 m)   Wt 135.5 kg   LMP 06/15/2023   SpO2 100%   BMI 45.43 kg/m  No intake/output data recorded. Total I/O In: -  Out: 1800 [Urine:1800]  FHT:  FHR: 125 bpm, variability: moderate,  accelerations:  Present,  decelerations:  Present some variables, early decels UC:   irregular, every 2-4 minutes SVE:   8 cm dilated, 80% effaced, -2 station per RN    Labs: Lab Results  Component Value Date   WBC 9.7 02/23/2024   HGB 12.1 02/23/2024   HCT 34.8 (L) 02/23/2024   MCV 92.1 02/23/2024   PLT 252 02/23/2024    Assessment / Plan: Induction of labor due to IUGR, chronic HTN,  progressing well on pitocin  Variable decelerations due to cord compression Severe IUGR Chronic hTN with superimposed preeclampsia Depression on Zoloft  Obesity affecting pregnancy P) amnioinfusion. Continue with pitocin    Anticipated MOD:  guarded  Patricia Boyer A Patricia Boyer 02/23/2024, 3:51 PM

## 2024-02-23 NOTE — Progress Notes (Signed)
 Patricia Boyer is a 44 y.o. G3P0020 at [redacted]w[redacted]d by LMP admitted for induction of labor due to IUGR. Called regarding inability to trace FHR due to active fetus  Attempt x 2 by RN unsuccessful in maintaining continuous fetal heart rate  ( when heard was 120's) On arrival ISE in place by faculty staff.  FHR consistent with prior tracing Subjective: Epidural Intracervical balloon out  Pitocin  off  Objective: BP (!) 145/87   Pulse 76   Temp 98.3 F (36.8 C) (Oral)   Resp 18   Ht 5' 8 (1.727 m)   Wt 135.5 kg   LMP 06/15/2023   SpO2 100%   BMI 45.43 kg/m  No intake/output data recorded. No intake/output data recorded.  FHT:  FHR: 125 bpm, variability: moderate,  accelerations:  Present,  decelerations:  Present smal variable  UC:   irregular, every ? 4-5  minutes SVE:   4/50/-3 IUPC placed   Labs: Lab Results  Component Value Date   WBC 9.7 02/23/2024   HGB 12.1 02/23/2024   HCT 34.8 (L) 02/23/2024   MCV 92.1 02/23/2024   PLT 252 02/23/2024   Urine PCR 0.3 Assessment / Plan:   Severe IUGR Chronic HTN with superimposed preeclampsia by PCR Depression on Zoloft  Obesity Unknown GBS  on IV PCN P) restart pitocin . Positional changes  Anticipated MOD:  guarded  Burton Gahan A Ardell Makarewicz 02/23/2024, 12:18 PM

## 2024-02-23 NOTE — Progress Notes (Signed)
 Patricia Boyer is a 44 y.o. G3P0020 at [redacted]w[redacted]d by LMP admitted for induction of labor due to sever IUGR.  Subjective: C/o headache resolved after fentanyl  S/p cytotec  BP elevated  given HTN protocol  Objective: BP (!) 150/108   Pulse 65   Temp 98.3 F (36.8 C) (Oral)   Resp 18   Ht 5' 8 (1.727 m)   Wt 135.5 kg   LMP 06/15/2023   SpO2 98%   BMI 45.43 kg/m  No intake/output data recorded. No intake/output data recorded.  FHT:  FHR: 130 bpm, variability: moderate,  accelerations:  Present,  decelerations:  Present variable UC:   irregular, every ? minutes SVE:   1 cm dilated, 70% effaced, -4 station Intracervical balloon placed   Labs: Lab Results  Component Value Date   WBC 9.7 02/23/2024   HGB 12.1 02/23/2024   HCT 34.8 (L) 02/23/2024   MCV 92.1 02/23/2024   PLT 252 02/23/2024    Assessment / Plan: Severe IUGR Chronic HTN on labetalol  and procardia   Obesity Unknown GBS Depression P) PIH labs pending. Start pitocin  when next cytotec  is due . Recheck GBS result with quest. Continue IV PCN until result known or delivery Continue antihypertensive medication. Zoloft  100 mg po every day   Anticipated MOD:  guarded  Patricia Boyer A Chidi Shirer 02/23/2024, 7:56 AM

## 2024-02-23 NOTE — Anesthesia Preprocedure Evaluation (Signed)
"                                    Anesthesia Evaluation  Patient identified by MRN, date of birth, ID band Patient awake    Reviewed: Allergy & Precautions, H&P , NPO status , Patient's Chart, lab work & pertinent test results  History of Anesthesia Complications Negative for: history of anesthetic complications  Airway Mallampati: II       Dental no notable dental hx.    Pulmonary neg pulmonary ROS   Pulmonary exam normal        Cardiovascular hypertension, Pt. on medications Normal cardiovascular exam     Neuro/Psych  PSYCHIATRIC DISORDERS Anxiety Depression    negative neurological ROS     GI/Hepatic Neg liver ROS,,,  Endo/Other    Class 3 obesity  Renal/GU negative Renal ROS  negative genitourinary   Musculoskeletal   Abdominal  (+) + obese  Peds  Hematology PLT: 252   Anesthesia Other Findings   Reproductive/Obstetrics (+) Pregnancy                              Anesthesia Physical Anesthesia Plan  ASA: 3  Anesthesia Plan: Epidural   Post-op Pain Management:    Induction:   PONV Risk Score and Plan:   Airway Management Planned:   Additional Equipment:   Intra-op Plan:   Post-operative Plan:   Informed Consent: I have reviewed the patients History and Physical, chart, labs and discussed the procedure including the risks, benefits and alternatives for the proposed anesthesia with the patient or authorized representative who has indicated his/her understanding and acceptance.       Plan Discussed with:   Anesthesia Plan Comments:         Anesthesia Quick Evaluation  "

## 2024-02-23 NOTE — Brief Op Note (Signed)
 02/23/2024  9:35 PM  10:46 PM  PATIENT:  Patricia Boyer  44 y.o. female  PRE-OPERATIVE DIAGNOSIS:  fetal bradycardia; fetal intolerance to labor, severe IUGR, chronic HTN with superimposed preeclampsia, obesity affecting pregnancy, IUP @ 36 1/7 wk  POST-OPERATIVE DIAGNOSIS:  same  PROCEDURE:  Emergency primary low transverse cesarean section  SURGEON:  Surgeons and Role:    * Rutherford Gain, MD - Primary    * Zina Jerilynn LABOR, MD - Assisting  PHYSICIAN ASSISTANT:   ASSISTANTS: Jerilynn Zina MD   ANESTHESIA:   epidural Findings; live female ROT,  uterine fibroids, nl left tube and ovary, right tube and ovary encased in adhesion, posterior cul de sac adhesions EBL:  456 ml   BLOOD ADMINISTERED:none  DRAINS: none   LOCAL MEDICATIONS USED:  NONE  SPECIMEN:  Source of Specimen:  placenta  DISPOSITION OF SPECIMEN:  PATHOLOGY  COUNTS:  YES  TOURNIQUET:  * No tourniquets in log *  DICTATION: .Other Dictation: Dictation Number [id number]  PLAN OF CARE: Admit to inpatient   PATIENT DISPOSITION:  PACU - hemodynamically stable.   Delay start of Pharmacological VTE agent (>24hrs) due to surgical blood loss or risk of bleeding: no

## 2024-02-23 NOTE — Progress Notes (Signed)
 Called by RN for assistance placing FSE given inability to trace FHT. Placed FSE without difficulty, FHT reassuring upon placement. RN notified Dr. Rutherford.  LOIS Yolanda Moats, MD, [facility] Attending Center for [employer] [facility])

## 2024-02-23 NOTE — Transfer of Care (Signed)
 Immediate Anesthesia Transfer of Care Note  Patient: Patricia Boyer  Procedure(s) Performed: CESAREAN DELIVERY  Patient Location: PACU  Anesthesia Type:Epidural  Level of Consciousness: awake, alert , oriented, and patient cooperative  Airway & Oxygen Therapy: Patient Spontanous Breathing  Post-op Assessment: Report given to RN, Post -op Vital signs reviewed and stable, and Patient moving all extremities X 4  Post vital signs: Reviewed and stable  Last Vitals:  Vitals Value Taken Time  BP 127/85 02/23/24 22:45  Temp 36.4 C 02/23/24 22:41  Pulse 76 02/23/24 22:49  Resp 20 02/23/24 22:49  SpO2 98 % 02/23/24 22:49  Vitals shown include unfiled device data.  Last Pain:  Vitals:   02/23/24 2241  TempSrc: Oral  PainSc:       Patients Stated Pain Goal: 0 (02/23/24 0920)  Complications: No notable events documented.

## 2024-02-23 NOTE — Progress Notes (Signed)
 Late entry  Returned at 9:10 pm to reassess  tracing and resumption of pitocin . Exam done by RN 6/90/-3  Resumption of pitocin  planned  when FHR  deep variable noted.   Pt placed on right lateral side  and observed with several contractions  FHR deceleration subsequently occurred   with positional changes started . Fetal bradycardia down to 70's  persisted despite all 4 . Stat C/S called. Pt and family notified . OR called, pt transferred to OR.

## 2024-02-23 NOTE — Anesthesia Procedure Notes (Signed)
 Epidural Patient location during procedure: OB Start time: 02/23/2024 9:50 AM End time: 02/23/2024 10:00 AM  Staffing Anesthesiologist: Patrisha Bernardino SQUIBB, MD Performed: anesthesiologist   Preanesthetic Checklist Completed: patient identified, IV checked, site marked, risks and benefits discussed, monitors and equipment checked, pre-op evaluation and timeout performed  Epidural Patient position: sitting Prep: DuraPrep Patient monitoring: heart rate, cardiac monitor, continuous pulse ox and blood pressure Approach: midline Location: L3-L4 Injection technique: LOR air  Needle:  Needle type: Tuohy  Needle gauge: 17 G Needle length: 9 cm Needle insertion depth: 7 cm Catheter type: closed end flexible Catheter size: 19 Gauge Catheter at skin depth: 13 cm Test dose: negative and 1.5% lidocaine  with Epi 1:200 K  Assessment Events: blood not aspirated, no cerebrospinal fluid, injection not painful, no injection resistance and negative IV test  Additional Notes Informed consent obtained prior to proceeding including risk of failure, 1% risk of PDPH, risk of minor discomfort and bruising. Discussed alternatives to epidural analgesia and patient desires to proceed.  Timeout performed pre-procedure verifying patient name, procedure, and platelet count.  Patient tolerated procedure well. Reason for block:procedure for pain

## 2024-02-24 ENCOUNTER — Encounter (HOSPITAL_COMMUNITY): Payer: Self-pay | Admitting: Obstetrics and Gynecology

## 2024-02-24 LAB — CBC
HCT: 33.8 % — ABNORMAL LOW (ref 36.0–46.0)
HCT: 33 % — ABNORMAL LOW (ref 36.0–46.0)
Hemoglobin: 11.4 g/dL — ABNORMAL LOW (ref 12.0–15.0)
Hemoglobin: 11.6 g/dL — ABNORMAL LOW (ref 12.0–15.0)
MCH: 31.8 pg (ref 26.0–34.0)
MCH: 32.7 pg (ref 26.0–34.0)
MCHC: 33.7 g/dL (ref 30.0–36.0)
MCHC: 35.2 g/dL (ref 30.0–36.0)
MCV: 94.2 fL (ref 80.0–100.0)
MCV: 93 fL (ref 80.0–100.0)
Platelets: 268 K/uL (ref 150–400)
Platelets: 274 K/uL (ref 150–400)
RBC: 3.59 MIL/uL — ABNORMAL LOW (ref 3.87–5.11)
RBC: 3.55 MIL/uL — ABNORMAL LOW (ref 3.87–5.11)
RDW: 12.8 % (ref 11.5–15.5)
RDW: 13 % (ref 11.5–15.5)
WBC: 19.2 K/uL — ABNORMAL HIGH (ref 4.0–10.5)
WBC: 16.8 K/uL — ABNORMAL HIGH (ref 4.0–10.5)
nRBC: 0 % (ref 0.0–0.2)
nRBC: 0 % (ref 0.0–0.2)

## 2024-02-24 LAB — COMPREHENSIVE METABOLIC PANEL WITH GFR
ALT: 16 U/L (ref 0–44)
AST: 43 U/L — ABNORMAL HIGH (ref 15–41)
Albumin: 3.4 g/dL — ABNORMAL LOW (ref 3.5–5.0)
Alkaline Phosphatase: 135 U/L — ABNORMAL HIGH (ref 38–126)
Anion gap: 9 (ref 5–15)
BUN: 7 mg/dL (ref 6–20)
CO2: 20 mmol/L — ABNORMAL LOW (ref 22–32)
Calcium: 9 mg/dL (ref 8.9–10.3)
Chloride: 103 mmol/L (ref 98–111)
Creatinine, Ser: 0.79 mg/dL (ref 0.44–1.00)
GFR, Estimated: >60 mL/min
Glucose, Bld: 93 mg/dL (ref 70–99)
Potassium: 4.2 mmol/L (ref 3.5–5.1)
Sodium: 132 mmol/L — ABNORMAL LOW (ref 135–145)
Total Bilirubin: 0.6 mg/dL (ref 0.0–1.2)
Total Protein: 6.5 g/dL (ref 6.5–8.1)

## 2024-02-24 MED ORDER — IBUPROFEN 600 MG PO TABS
600.0000 mg | ORAL_TABLET | Freq: Four times a day (QID) | ORAL | Status: DC
  Administered 2024-02-24 – 2024-02-26 (×10): 600 mg via ORAL
  Filled 2024-02-24 (×10): qty 1

## 2024-02-24 MED ORDER — OXYTOCIN-SODIUM CHLORIDE 30-0.9 UT/500ML-% IV SOLN
2.5000 [IU]/h | INTRAVENOUS | Status: AC
  Administered 2024-02-24: 2.5 [IU]/h via INTRAVENOUS
  Filled 2024-02-24: qty 500

## 2024-02-24 MED ORDER — LACTATED RINGERS IV BOLUS
1000.0000 mL | Freq: Once | INTRAVENOUS | Status: AC
  Administered 2024-02-24: 1000 mL via INTRAVENOUS

## 2024-02-24 MED ORDER — SIMETHICONE 80 MG PO CHEW
80.0000 mg | CHEWABLE_TABLET | Freq: Three times a day (TID) | ORAL | Status: DC
  Administered 2024-02-24 – 2024-02-26 (×6): 80 mg via ORAL
  Filled 2024-02-24 (×6): qty 1

## 2024-02-24 MED ORDER — MENTHOL 3 MG MT LOZG: 1.0000 | LOZENGE | OROMUCOSAL | Status: DC | PRN

## 2024-02-24 MED ORDER — DIBUCAINE (PERIANAL) 1 % EX OINT: 1.0000 | TOPICAL_OINTMENT | CUTANEOUS | Status: DC | PRN

## 2024-02-24 MED ORDER — ACETAMINOPHEN 500 MG PO TABS
1000.0000 mg | ORAL_TABLET | Freq: Four times a day (QID) | ORAL | Status: DC
  Administered 2024-02-24 – 2024-02-26 (×10): 1000 mg via ORAL
  Filled 2024-02-24 (×10): qty 2

## 2024-02-24 MED ORDER — SENNOSIDES-DOCUSATE SODIUM 8.6-50 MG PO TABS
2.0000 | ORAL_TABLET | ORAL | Status: DC
  Administered 2024-02-24 – 2024-02-26 (×3): 2 via ORAL
  Filled 2024-02-24 (×3): qty 2

## 2024-02-24 MED ORDER — SERTRALINE HCL 50 MG PO TABS
50.0000 mg | ORAL_TABLET | Freq: Every day | ORAL | Status: DC
  Administered 2024-02-24 – 2024-02-25 (×2): 50 mg via ORAL
  Filled 2024-02-24 (×2): qty 1

## 2024-02-24 MED ORDER — COCONUT OIL OIL: 1.0000 | TOPICAL_OIL | Status: DC | PRN

## 2024-02-24 MED ORDER — LACTATED RINGERS IV SOLN: INTRAVENOUS | Status: DC

## 2024-02-24 MED ORDER — OXYCODONE HCL 5 MG PO TABS
5.0000 mg | ORAL_TABLET | ORAL | Status: DC | PRN
  Administered 2024-02-25: 5 mg via ORAL
  Filled 2024-02-24: qty 1

## 2024-02-24 MED ORDER — SIMETHICONE 80 MG PO CHEW: 80.0000 mg | CHEWABLE_TABLET | ORAL | Status: DC | PRN

## 2024-02-24 MED ORDER — WITCH HAZEL-GLYCERIN EX PADS: 1.0000 | MEDICATED_PAD | CUTANEOUS | Status: DC | PRN

## 2024-02-24 MED ORDER — DIPHENHYDRAMINE HCL 25 MG PO CAPS
25.0000 mg | ORAL_CAPSULE | Freq: Four times a day (QID) | ORAL | Status: DC | PRN
  Administered 2024-02-24 (×2): 25 mg via ORAL
  Filled 2024-02-24 (×2): qty 1

## 2024-02-24 MED ORDER — PRENATAL MULTIVITAMIN CH
1.0000 | ORAL_TABLET | Freq: Every day | ORAL | Status: DC
  Administered 2024-02-24 – 2024-02-25 (×2): 1 via ORAL
  Filled 2024-02-24 (×2): qty 1

## 2024-02-24 MED ORDER — ZOLPIDEM TARTRATE 5 MG PO TABS: 5.0000 mg | ORAL_TABLET | Freq: Every evening | ORAL | Status: DC | PRN

## 2024-02-24 NOTE — Lactation Note (Signed)
 This note was copied from a baby's chart.  NICU Lactation Consultation Note  Patient Name: Patricia Boyer Unijb'd Date: 02/24/2024 Age:43 hours  Reason for consult: Initial assessment; 1st time breastfeeding; Primapara; NICU baby; Late-preterm 34-36.6wks; Infant < 5lbs; Other (Comment) (CHTN, IUGR, AMA)  SUBJECTIVE  LC in to visit with P1 Mom of LPTI [name] born by C/Section at [redacted]w[redacted]d and weighing 3 lbs 10.9 oz. Baby admitted to NICU for IUGR and respiratory support.  Baby is on room air currently.    Mom desires to direct breastfeed baby when he is ready.    Mom states she has pumped 3 times already.  Mom provided with a pumping top and re-sized her flanges to 21 mm for a better fit.  Mom encouraged to ask for coconut oil prn to lubricate nipples.    Reviewed breast massage and hand expression.  Unable to express colostrum presently.  Mom reassured that this was normal and to continue her consistent pumping to stimulate her milk producing hormones.  Mom encouraged to ask about STS also.    Plan recommended- 1- STS with baby when able to 2- breast massage and hand express often 3- Pump both breasts on initiation setting every 3 hrs or 8 times per 24 hrs. 4- ask for LC prn  Mom aware of DEBP in baby's room and how to take her pump parts to NICU.   Reviewed importance of disassembling pump parts, washing, rinsing and placing in basin to dry on clean paper towels.  CDC guidelines provided.  OBJECTIVE Infant data: No data recorded O2 Device: Room Air  Infant feeding assessment IDFS - Readiness: 3   Maternal data: [id number] C-Section, Low Transverse Has patient been taught Hand Expression?: Yes Hand Expression Comments: unable to express colostrum presently Significant Breast History:: little breast changes in early pregnancy Current breast feeding challenges:: Infant admitted to NICU Does the patient have breastfeeding experience prior to this delivery?: No Pumping  frequency: Assisted Mom to pump for the 4th time.  Encouraged pumping every 3 hrs, goal is 8 times per 24 hrs Pumped volume: 0 mL Flange Size: 21 Hands-free pumping top sizes: X-Large Dori) Risk factor for low/delayed milk supply:: infant separation  WIC Program: No WIC Referral Sent?: No Pump: Referral sent for [employer] Pump   ASSESSMENT Infant:  Feeding Status: NPO  Maternal: Milk volume: Normal  INTERVENTIONS/PLAN Interventions: Interventions: Skin to skin; Breast feeding basics reviewed; Breast massage; Hand express; DEBP; Education; [employer] Services brochure; CDC Guidelines for Breast Pump Cleaning Tools: Pump; Flanges; Hands-free pumping top Pump Education: Setup, frequency, and cleaning; Milk Storage  Plan: Consult Status: NICU follow-up NICU Follow-up type: Verify onset of copious milk; Verify absence of engorgement; Verify DEBP issuance   Claudene Aleck BRAVO 02/24/2024, 10:39 AM

## 2024-02-24 NOTE — Op Note (Unsigned)
 Patricia Boyer, Patricia Boyer MEDICAL RECORD NO: [id number] ACCOUNT NO: [id number] DATE OF BIRTH: 12/25/1980 FACILITY: [facility] LOCATION: [facility] PHYSICIAN: Sebert Stollings A. Rutherford, MD  Operative Report   DATE OF PROCEDURE: 02/23/2024  PREOPERATIVE DIAGNOSES:   1.  Nonreassuring fetal tracing. 2.  Severe intrauterine growth restriction. 3.  Chronic hypertension. 4.  Obesity affecting pregnancy. 5.  Intrauterine gestation at 36 weeks. 6.  Chronic hypertension with superimposed preeclampsia.  PROCEDURE:  Emergency primary cesarean section, Kerr hysterotomy.  POSTOPERATIVE DIAGNOSES:   1.  Nonreassuring fetal tracing. 2.  Severe intrauterine growth restriction. 3.  Chronic hypertension. 4.  Obesity affecting pregnancy. 5.  Intrauterine gestation at 36 weeks. 6.  Chronic hypertension with superimposed preeclampsia.  ANESTHESIA:  Epidural.  SURGEON:  Dickie Rutherford, MD  ASSISTANT:  Dr. Jerilynn Buddle. The assistant was necessary due to the emergent need for an additional hand with a fetal heart rate deceleration and expertise needed.    DESCRIPTION OF PROCEDURE:  Under adequate epidural anesthesia, the patient was quickly placed in the supine position.  An indwelling Foley catheter is already in place.  Betadine was quickly splashed on the abdomen after confirming fetal heart rate was  still remaining in the bradycardic range of 98.  The patient was quickly draped.  Intraoperative antibiotics of azithromycin  and Ancef  were ordered.  A Pfannenstiel skin incision was then made, carried down to the rectus fascia.  The rectus fascia was  opened transversely.  The rectus fascia was sharply dissected off the rectus muscles superiorly and inferiorly.  The rectus muscle was split in the midline.  The parietal peritoneum was entered bluntly.  An Alexis retractor was placed.  A bladder  retractor was placed.  A large Deaver was placed.  The low transverse incision was then made, extended in a cephalic and  caudad manner.  Copious amnioinfusion fluid was noted.  Subsequent delivery of a live female from the right occiput transverse position  with a large caput.  The cord was clamped, cut, and the baby was transferred to the awaiting pediatricians who subsequently assigned Apgars of 8 and 9 at one and five minutes.  The uterus was exteriorized, and was tented.  The placenta was manually  removed.  There was evidence of an intramural fibroid, a small one, extended into the anterior submucosal area.  The uterine cavity was cleaned of debris.  The uterine incision was closed in two layers.  The first layer 0 Monocryl running locked stitch.   The second layer was imbricating using 0 Monocryl suture.  The uterus was exteriorized due to tethering on the right side.  There were peri-adnexal adhesions around the tube and the ovary.  These adhesions were lysed on the right to show the right  ovary.  The left tube and ovary were normal.  The posterior cul-de-sac had evidence of adhesions.  The abdomen was irrigated and suctioned of debris.  The uterus was returned back to the abdomen.  There was an omental adhesion adhering to the bladder  reflection, the bladder anteriorly on the left.  This was lysed in order to facilitate the closure of the peritoneum.  Surgicel was placed overlying the incision on the low uterine segment.  The parietal peritoneum was then closed with 2-0 Vicryl.  The  rectus fascia was closed with 0 Vicryl x2.  The subcutaneous area is irrigated.  Small bleeders cauterized.  Interrupted 2-0 plain sutures placed.  Skin approximated using a 4-0 Vicryl subcuticular closure.  A pressure dressing was placed after  Steri-Strips and benzoin was placed.  SPECIMEN:  Placenta sent to pathology.  Cord pH was obtained.  ESTIMATED BLOOD LOSS:  469 mL  INTRAOPERATIVE FLUIDS:  800 mL  URINE OUTPUT:  300 mL  COUNTS:  Sponge and instrument counts x2 were correct.  COMPLICATIONS:  None.  DISPOSITION:  The  patient was transferred to the recovery room in stable condition.  The baby was transferred to the NICU due to small size.    PAA D: 02/23/2024 11:02:28 pm T: 02/24/2024 12:36:00 am  JOB: [id number]/ [id number]

## 2024-02-24 NOTE — Clinical Social Work Maternal (Signed)
 " CLINICAL SOCIAL WORK MATERNAL/CHILD NOTE  Patient Details  Name: Patricia Boyer MRN: [id number] Date of Birth: 1980/03/19  Date:  02/24/2024  Clinical Social Worker Initiating Note:  Nat Quiet, [place] Date/Time: Initiated:  02/24/24/1152     Child's Name:  Patricia Boyer   Biological Parents:  Mother, Father (Mother: Patricia Boyer. Patricia Boyer Father: Patricia Boyer 02-28-1982)   Need for Interpreter:  None   Reason for Referral:  Parental Support of Premature Babies < 32 weeks/or Critically Ill babies, Behavioral Health Concerns   Address:  [address] [place] [address]    Phone number:  [phone] (home)     Additional phone number:   Household Members/Support Persons (HM/SP):   Household Member/Support Person 1   HM/SP Name Relationship DOB or Age  HM/SP -1 Patricia Boyer Significant Other 12-Jan-1982  HM/SP -2        HM/SP -3        HM/SP -4        HM/SP -5        HM/SP -6        HM/SP -7        HM/SP -8          Natural Supports (not living in the home):  Parent   Professional Supports: Therapist   Employment: Full-time   Type of Work: Catering Manager.   Education:  Doctoral degree   Homebound arranged:    Surveyor, Quantity Resources:  Media Planner    Other Resources:      Cultural/Religious Considerations Which May Impact Care:    Strengths:  Ability to meet basic needs  , Home prepared for child  , Psychotropic Medications, Pediatrician chosen   Psychotropic Medications:  Zoloft       Pediatrician:    [employer] area  Pediatrician List:   [facility]  [place]    [place]    [place] [facility]      Pediatrician Fax Number:    Risk Factors/Current Problems:  Mental Health Concerns     Cognitive State:  Able to Concentrate  , Alert     Mood/Affect:  Calm  , Comfortable  , Interested     CSW  Assessment: CSW received consult for hx of Anxiety, Depression and NICU admission.  CSW met with MOB to offer support and complete assessment.    CSW met with MOB at bedside and introduced CSW role. MOB appeared calm, pleasant and engaged during the visit. MOB confirmed that her demographic information on hospital file was correct and stated that she resides there with FOB. CSW inquired how MOB had been feeling since giving birth. MOB reported feeling overwhelmed but stated she was coping well. She shared that had difficulty sleeping due to medical care throughout the evening and morning. MOB shared that she early awaiting to visit with her son in the NICU. CSW validated MOB's feelings. CSW inquired about MOB's support system during this time. MOB identified FOB and her parents as her primary source of support. CSW discussed NICU support services and offered to check in with MOB while the infant remains in the NICU. MOB opted to call CSW if needs arise.   CSW inquired about MOB's mental health history. MOB reported that she has a history of depression and anxiety. She reported that she was proactive in discussing her mental health with her OBGYN during the pregnancy. MOB reported that  her provider prescribed Zoloft  to help manage her symptoms which she feels has been working well for her. MOB reported that her provider arranged for her to see a counselor at [facility] for perinatal counseling services. MOB reported that her next appointment is 1/13. CSW affirmed MOB's treatment plan. CSW also provided education regarding the baby blues period vs. perinatal mood disorders, discussed crisis treatment and gave additional resources for mental health follow up if concerns arise. CSW recommended MOB complete a self-evaluation during the postpartum time period using the New Mom Checklist from Postpartum Progress and encouraged MOB to contact a medical professional if symptoms are noted at any time. CSW assessed  MOB for safety. MOB denied SI/HI and DV.   CSW inquired if MOB had all essential items to care for her infant at home. MOB reported that she had all essential items to care for her infant including a crib and car seat. CSW provided review of Sudden Infant Death Syndrome (SIDS) precautions. MOB verbalized understanding. MOB reported that she had chosen Dr. Hadassah Bathe at [facility] and confirmed that she would have transportation to the NICU and to appointments.   CSW identifies no further need for intervention and no barriers to discharge at this time.  CSW Plan/Description:  Perinatal Mood and Anxiety Disorder (PMADs) Education, Sudden Infant Death Syndrome (SIDS) Education, No Further Intervention Required/No Barriers to Discharge    Nat DELENA Quiet, LCSW 02/24/2024, 12:00 PM  "

## 2024-02-24 NOTE — Anesthesia Postprocedure Evaluation (Signed)
"   Anesthesia Post Note  Patient: Patricia Boyer  Procedure(s) Performed: CESAREAN DELIVERY     Patient location during evaluation: PACU Anesthesia Type: Epidural Level of consciousness: awake Pain management: pain level controlled Vital Signs Assessment: post-procedure vital signs reviewed and stable Respiratory status: spontaneous breathing, nonlabored ventilation and respiratory function stable Cardiovascular status: blood pressure returned to baseline and stable Postop Assessment: no apparent nausea or vomiting Anesthetic complications: no   No notable events documented.  Last Vitals:  Vitals:   02/23/24 2330 02/23/24 2345  BP: (!) 156/98 (!) 141/96  Pulse: 71 76  Resp: 20 12  Temp:    SpO2: 97% 100%    Last Pain:  Vitals:   02/23/24 2345  TempSrc:   PainSc: 0-No pain   Pain Goal: Patients Stated Pain Goal: 0 (02/23/24 0920)  LLE Motor Response: Purposeful movement (02/23/24 2345) LLE Sensation: Tingling (02/23/24 2345) RLE Motor Response: Purposeful movement (02/23/24 2345) RLE Sensation: Tingling (02/23/24 2345)     Epidural/Spinal Function Cutaneous sensation: Tingles (02/23/24 2345), Patient able to flex knees: Yes (02/23/24 2345), Patient able to lift hips off bed: No (02/23/24 2345), Back pain beyond tenderness at insertion site: No (02/23/24 2345), Progressively worsening motor and/or sensory loss: No (02/23/24 2345), Bowel and/or bladder incontinence post epidural: No (02/23/24 2345)  Nyesha Cliff P Ivone Licht      "

## 2024-02-24 NOTE — Progress Notes (Signed)
 Called Dr. Rutherford because patient's last measured urine output in her catheter was 50 mL at 0500. Urine was amber-colored. Patient has been drinking a little but was encouraged to drink at least one cup per hour. Provider said to infuse one bag of LR (1,000 mL) over 1 hour and tell the patient to drink a lot more fluid by mouth.

## 2024-02-24 NOTE — Progress Notes (Signed)
"   SVD: {findings; c - section delivery reason:31449}  S:  Pt reports feeling ***/ Tolerating po/ Voiding without problems/ No n/v/ Bleeding is {Description; bleeding vaginal:11356}/ Pain controlled with{treatments; pain control med:13496}    O:  A & O x 3 ***/ VS: Blood pressure 126/80, pulse 72, temperature 98 F (36.7 C), temperature source Oral, resp. rate 18, height 5' 8 (1.727 m), weight 135.5 kg, last menstrual period 06/15/2023, SpO2 99%, unknown if currently breastfeeding.  LABS:  Results for orders placed or performed during the hospital encounter of 02/22/24 (from the past 24 hours)  CBC     Status: Abnormal   Collection Time: 02/23/24 11:22 PM  Result Value Ref Range   WBC 19.2 (H) 4.0 - 10.5 K/uL   RBC 3.59 (L) 3.87 - 5.11 MIL/uL   Hemoglobin 11.4 (L) 12.0 - 15.0 g/dL   HCT 66.1 (L) 63.9 - 53.9 %   MCV 94.2 80.0 - 100.0 fL   MCH 31.8 26.0 - 34.0 pg   MCHC 33.7 30.0 - 36.0 g/dL   RDW 87.1 88.4 - 84.4 %   Platelets 268 150 - 400 K/uL   nRBC 0.0 0.0 - 0.2 %  CBC     Status: Abnormal   Collection Time: 02/24/24  5:00 AM  Result Value Ref Range   WBC 16.8 (H) 4.0 - 10.5 K/uL   RBC 3.55 (L) 3.87 - 5.11 MIL/uL   Hemoglobin 11.6 (L) 12.0 - 15.0 g/dL   HCT 66.9 (L) 63.9 - 53.9 %   MCV 93.0 80.0 - 100.0 fL   MCH 32.7 26.0 - 34.0 pg   MCHC 35.2 30.0 - 36.0 g/dL   RDW 86.9 88.4 - 84.4 %   Platelets 274 150 - 400 K/uL   nRBC 0.0 0.0 - 0.2 %  Comprehensive metabolic panel     Status: Abnormal   Collection Time: 02/24/24  5:00 AM  Result Value Ref Range   Sodium 132 (L) 135 - 145 mmol/L   Potassium 4.2 3.5 - 5.1 mmol/L   Chloride 103 98 - 111 mmol/L   CO2 20 (L) 22 - 32 mmol/L   Glucose, Bld 93 70 - 99 mg/dL   BUN 7 6 - 20 mg/dL   Creatinine, Ser 9.20 0.44 - 1.00 mg/dL   Calcium 9.0 8.9 - 89.6 mg/dL   Total Protein 6.5 6.5 - 8.1 g/dL   Albumin 3.4 (L) 3.5 - 5.0 g/dL   AST 43 (H) 15 - 41 U/L   ALT 16 0 - 44 U/L   Alkaline Phosphatase 135 (H) 38 - 126 U/L   Total  Bilirubin 0.6 0.0 - 1.2 mg/dL   GFR, Estimated >39 >39 mL/min   Anion gap 9 5 - 15    I&O: I/O last 3 completed shifts: In: 2240 [P.O.:840; I.V.:1100; IV Piggyback:300] Out: 3651 [Urine:2875; Blood:776]   Total I/O In: 1080 [P.O.:1080] Out: 2050 [Urine:2050]  Lungs: {Exam; lungs:5033}  Heart: {Exam; heart:5510}  Abdomen: {PE ABDOMEN POSTPARTUM OBGYN:313106}  Perineum: {exam; perineum:10172}  Lochia: ***  Extremities:{pe extremities na:685541}    A/P: POD # ***/PPD # ***/ [id number]  Doing well  Continue routine post partum orders  ***  "

## 2024-02-25 MED ORDER — POTASSIUM CHLORIDE CRYS ER 20 MEQ PO TBCR
20.0000 meq | EXTENDED_RELEASE_TABLET | Freq: Two times a day (BID) | ORAL | Status: DC
  Administered 2024-02-25: 20 meq via ORAL
  Filled 2024-02-25 (×2): qty 1

## 2024-02-25 MED ORDER — FUROSEMIDE 20 MG PO TABS
40.0000 mg | ORAL_TABLET | Freq: Two times a day (BID) | ORAL | Status: DC
  Administered 2024-02-25 – 2024-02-26 (×2): 40 mg via ORAL
  Filled 2024-02-25 (×2): qty 2

## 2024-02-25 NOTE — Progress Notes (Signed)
 Pt left unit via wheelchair with family to go to nicu.

## 2024-02-25 NOTE — Progress Notes (Signed)
"   SVD: primary  S:  Pt reports feeling well/ Tolerating po/ Voiding without problems/ No n/v/ Bleeding is light/ Pain controlled withprescription NSAID's including ibuprofen  (Motrin ) and narcotic analgesics including oxycodone  (Oxycontin , Oxyir)  Baby in NICU   O:  A & O x 3 / VS: Blood pressure 128/83, pulse 78, temperature 98 F (36.7 C), temperature source Oral, resp. rate 17, height 5' 8 (1.727 m), weight 135.5 kg, last menstrual period 06/15/2023, SpO2 100%, unknown if currently breastfeeding.  LABS: No results found for this or any previous visit (from the past 24 hours).  I&O: I/O last 3 completed shifts: In: 3320 [P.O.:1920; I.V.:1100; IV Piggyback:300] Out: 4301 [Urine:3525; Blood:776] Patient Vitals for the past 24 hrs:  BP Temp Temp src Pulse Resp SpO2  02/25/24 1815 128/83 98 F (36.7 C) Oral 78 17 100 %  02/25/24 1017 (!) 140/90 -- -- -- -- --  02/25/24 0911 (!) 145/92 (!) 97.5 F (36.4 C) Oral 88 18 98 %  02/25/24 0611 (!) 145/73 98.3 F (36.8 C) -- 76 18 100 %  02/24/24 1946 138/84 -- -- 95 18 --     No intake/output data recorded.  Lungs: no tachypnea, retractions or cyanosis  Heart: regular rate and rhythm  Abdomen: pressure dressing off. Soft obese.pfannensteil incision with steristrip. No induration or exudate  Perineum: not inspected  Lochia: light  Extremities:no redness or tenderness in the calves or thighs, edema tr+    A/P: POD # 2/PPD # 2/ [id number] s/p c/s Chronic HTN with mild superimposed preeclampsia Depression on Zoloft  obesity  Doing well  Continue routine post partum orders  Will add lasix  to regimen of procardia  XL and labetalol  Anticipate d/c in am  "

## 2024-02-26 ENCOUNTER — Telehealth: Payer: Self-pay

## 2024-02-26 LAB — SURGICAL PATHOLOGY

## 2024-02-26 MED ORDER — POTASSIUM CHLORIDE CRYS ER 20 MEQ PO TBCR: 20.0000 meq | EXTENDED_RELEASE_TABLET | Freq: Two times a day (BID) | ORAL | 0 refills | Status: AC

## 2024-02-26 MED ORDER — FUROSEMIDE 40 MG PO TABS: 40.0000 mg | ORAL_TABLET | Freq: Two times a day (BID) | ORAL | 0 refills | Status: AC

## 2024-02-26 MED ORDER — IBUPROFEN 600 MG PO TABS: 600.0000 mg | ORAL_TABLET | Freq: Four times a day (QID) | ORAL | 11 refills | Status: AC | PRN

## 2024-02-26 MED ORDER — OXYCODONE HCL 5 MG PO TABS: 5.0000 mg | ORAL_TABLET | ORAL | 0 refills | Status: AC | PRN

## 2024-02-26 NOTE — Lactation Note (Signed)
 This note was copied from a baby's chart.  NICU Lactation Consultation Note  Patient Name: Boy Samiah Ricklefs Unijb'd Date: 02/26/2024 Age:44 years  Reason for consult: Follow-up assessment; Primapara; 1st time breastfeeding; NICU baby; Late-preterm 34-36.6wks; Infant < 5lbs; Other (Comment); Maternal discharge (cHTN, IUGR, AMA)  SUBJECTIVE Visited with family of 7 43/69 weeks old AGA NICU female [name]; Ms. Dodge is a P1 and reported she's been pumping and now seeing small droplets of colostrum, praised her for all her efforts. Noticed that pumping still hasn't been consistent. She's getting discharged from the [facility] today. Reviewed discharge education, pump settings and the importance of consistent pumping for the onset of secretory activation and the prevention of engorgement.   She mentioned she would like to take baby to breast once he's ready but that the cables/lines made her a bit nervous when this LC tried assisting with latch; placed baby prone on mother's chest and left couplet engaging in STS care. Suggested to try breastfeeding again once IV is D/C. Explained IDF 1/2 and feeding readiness.   OBJECTIVE Infant data: Mother's Current Feeding Choice: Breast Milk and Formula  O2 Device: Room Air  Infant feeding assessment IDFS - Readiness: 2   Maternal data: [id number] C-Section, Low Transverse Pumping frequency: 2 times/24 hours Pumped volume: -- (droplets)  WIC Program: No WIC Referral Sent?: No Pump: Received Stork Pump (Spectra  S2 Plus)  ASSESSMENT Infant: Feeding Status: Scheduled 8-11-2-5 Feeding method: Tube/Gavage (Bolus)  Maternal: Milk volume: Normal  INTERVENTIONS/PLAN Interventions: Interventions: Breast feeding basics reviewed; DEBP; Education; Skin to skin; Assisted with latch; Adjust position; Support pillows; Infant Driven Feeding Algorithm education Discharge Education: Engorgement and breast care  Plan: STS around care times Pump both breasts on  initiate mode every 3 hours for 15 minutes, ideally 8 pumping sessions/24 hours Switch to maintain mode once expressing + 20 ml of EBM combined or by day 5; whichever happens first Bring all pump pieces to baby's room after her discharge  FOB present and supportive. All questions and concerns answered, family to contact [facility] services PRN.  Consult Status: NICU follow-up NICU Follow-up type: Verify onset of copious milk; Verify absence of engorgement   Stryker Veasey S Hale Chalfin 02/26/2024, 12:15 PM

## 2024-02-26 NOTE — Progress Notes (Incomplete)
"   SVD: {findings; c - section delivery reason:31449}  S:  Pt reports feeling ***/ Tolerating po/ Voiding without problems/ No n/v/ Bleeding is {Description; bleeding vaginal:11356}/ Pain controlled with{treatments; pain control med:13496}    O:  A & O x 3 ***/ VS: Blood pressure 136/82, pulse 76, temperature 97.8 F (36.6 C), temperature source Oral, resp. rate 17, height 5' 8 (1.727 m), weight 135.5 kg, last menstrual period 06/15/2023, SpO2 100%, unknown if currently breastfeeding.  LABS: No results found for this or any previous visit (from the past 24 hours).  I&O: I/O last 3 completed shifts: In: -  Out: 200 [Urine:200]   No intake/output data recorded.  Lungs: {Exam; lungs:5033}  Heart: {Exam; heart:5510}  Abdomen: {PE ABDOMEN POSTPARTUM OBGYN:313106}  Perineum: {exam; perineum:10172}  Lochia: ***  Extremities:{pe extremities na:685541}    A/P: POD # ***/PPD # ***/ [id number]  Doing well  Continue routine post partum orders  ***  "

## 2024-02-26 NOTE — Telephone Encounter (Signed)
 Called pt to set up an appointment next week with Dr. Sheena. No answer, left message for pt to return the call.

## 2024-02-26 NOTE — Discharge Summary (Signed)
 "    Postpartum Discharge Summary  Date of Service updated***     Patient Name: Patricia Boyer DOB: Jul 11, 1980 MRN: [id number]  Date of admission: 02/22/2024 Delivery date:02/23/2024 Delivering provider: Deaira Leckey Date of discharge: 02/26/2024  Admitting diagnosis: Pregnancy [Z34.90] IUGR (intrauterine growth restriction) affecting care of mother [O36.5990] Postpartum care following cesarean delivery [Z39.2] Intrauterine pregnancy: [redacted]w[redacted]d     Secondary diagnosis:  Principal Problem:   Pregnancy Active Problems:   IUGR (intrauterine growth restriction) affecting care of mother   Postpartum care following cesarean delivery  Additional problems: ***    Discharge diagnosis: {DX.:23714}                                              Post partum procedures:{Postpartum procedures:23558} Augmentation: {Augmentation:20782} Complications: {OB Labor/Delivery Complications:20784}  Hospital course: {Courses:23701}  Magnesium  Sulfate received: {Mag received:30440022} BMZ received: {BMZ received:30440023} Rhophylac:{Rhophylac received:30440032} FFM:{FFM:69559966} T-DaP:{Tdap:23962} Flu: {Qol:76036} RSV Vaccine received: {RSV:31013} Transfusion:{Transfusion received:30440034} Immunizations administered: Immunization History  Administered Date(s) Administered   PFIZER(Purple Top)SARS-COV-2 Vaccination 04/16/2019, 05/08/2019, 03/25/2020   Tdap 05/15/2018    Physical exam  Vitals:   02/25/24 1017 02/25/24 1815 02/25/24 2124 02/26/24 0600  BP: (!) 140/90 128/83 121/79 136/82  Pulse:  78 83 76  Resp:  17 16 17   Temp:  98 F (36.7 C) 98.2 F (36.8 C) 97.8 F (36.6 C)  TempSrc:  Oral Oral Oral  SpO2:  100%  100%  Weight:      Height:       General: {Exam; general:21111117} Lochia: {Desc; appropriate/inappropriate:30686::appropriate} Uterine Fundus: {Desc; firm/soft:30687} Incision: {Exam; incision:21111123} DVT Evaluation: {Exam; dvt:2111122} Labs: Lab Results   Component Value Date   WBC 16.8 (H) 02/24/2024   HGB 11.6 (L) 02/24/2024   HCT 33.0 (L) 02/24/2024   MCV 93.0 02/24/2024   PLT 274 02/24/2024      Latest Ref Rng & Units 02/24/2024    5:00 AM  CMP  Glucose 70 - 99 mg/dL 93   BUN 6 - 20 mg/dL 7   Creatinine 9.55 - 8.99 mg/dL 9.20   Sodium 864 - 854 mmol/L 132   Potassium 3.5 - 5.1 mmol/L 4.2   Chloride 98 - 111 mmol/L 103   CO2 22 - 32 mmol/L 20   Calcium 8.9 - 10.3 mg/dL 9.0   Total Protein 6.5 - 8.1 g/dL 6.5   Total Bilirubin 0.0 - 1.2 mg/dL 0.6   Alkaline Phos 38 - 126 U/L 135   AST 15 - 41 U/L 43   ALT 0 - 44 U/L 16    Edinburgh Score:    02/24/2024    9:00 AM  Edinburgh Postnatal Depression Scale Screening Tool  I have been able to laugh and see the funny side of things. --      After visit meds:  Allergies as of 02/26/2024   No Known Allergies      Medication List     TAKE these medications    Fluocinolone  Acetonide Scalp 0.01 % Oil Commonly known as: Derma-Smoothe /FS Scalp Apply 1 Application topically daily as needed.   folic acid  1 MG tablet Commonly known as: FOLVITE  Take 1 tablet (1 mg total) by mouth daily.   furosemide  40 MG tablet Commonly known as: LASIX  Take 1 tablet (40 mg total) by mouth 2 (two) times daily.   ibuprofen  600 MG tablet Commonly known as: ADVIL   Take 1 tablet (600 mg total) by mouth every 6 (six) hours as needed.   labetalol  300 MG tablet Commonly known as: NORMODYNE  Take 1 tablet (300 mg total) by mouth 2 (two) times daily.   multivitamin-prenatal 27-0.8 MG Tabs tablet TAKE 1 TABLET BY MOUTH DAILY AT 12 NOON.   NIFEdipine  60 MG 24 hr tablet Commonly known as: PROCARDIA  XL/NIFEDICAL XL Take 1 tablet (60 mg total) by mouth daily.   oxyCODONE  5 MG immediate release tablet Commonly known as: Oxy IR/ROXICODONE  Take 1 tablet (5 mg total) by mouth every 4 (four) hours as needed for up to 7 days for moderate pain (pain score 4-6).   potassium chloride  SA 20 MEQ  tablet Commonly known as: KLOR-CON  M Take 1 tablet (20 mEq total) by mouth 2 (two) times daily.   sertraline  50 MG tablet Commonly known as: ZOLOFT  Take 50 mg by mouth daily.         Discharge home in stable condition Infant Feeding: {Baby feeding:23562} Infant Disposition:{CHL IP OB HOME WITH FNUYZM:76418} Discharge instruction: per After Visit Summary and Postpartum booklet. Activity: Advance as tolerated. Pelvic rest for 6 weeks.  Diet: {OB diet:21111121} Anticipated Birth Control: {Birth Control:23956} Postpartum Appointment:{Outpatient follow up:23559} Additional Postpartum F/U: {PP Procedure:23957} Future Appointments: Future Appointments  Date Time Provider Department Center  09/21/2024  2:00 PM Georgina Speaks, FNP [facility] [phone] [place]   Follow up Visit:  Follow-up Information     Tobb, Kardie, DO. Schedule an appointment as soon as possible for a visit in 1 week(s).   Specialty: Cardiology Contact information: [address] [place] [address] [phone]         Rutherford Gain, MD Follow up in 6 week(s).   Specialty: Obstetrics and Gynecology Contact information: [address] [place] [address] [phone]                     02/26/2024 Gain DELENA Rutherford, MD   "

## 2024-03-01 NOTE — Lactation Note (Signed)
 This note was copied from a baby'Patricia chart.  NICU Lactation Consultation Note  Patient Name: Patricia Boyer Unijb'd Date: 03/01/2024 Age:43 days  Reason for consult: Primapara; 1st time breastfeeding; Weekly NICU follow-up; NICU baby; Early term 37-38.6wks; Infant < 5lbs; Other (Comment) (cHTN, IUGR, AMA)  SUBJECTIVE Visited with family of 72 68/37 weeks old AGA NICU female Patricia Boyer; Patricia Boyer is a P1 and reported she'Patricia still pumping twice/day morning and nights; her plan is to supplement with formula as well, baby is currently on Similac 24 calorie formula.   Explained that if she continues to pump at that pace, her supply will start to dwindle and eventually go away. She reported she'Patricia using her Mom Cozy breast pump at home but that the inserts don't fit correctly. The L side is fine but the R side feels tight. She couldn't find the inserts size; instructed her to look on the side (tunnel) inset of the small flange attachment and asked her to bring them in, in case she need to be resized or sized properly. Revised storage guidelines and asked her to keep her pumping sessions separately instead of consolidating them.   OBJECTIVE Infant data: Mother'Patricia Current Feeding Choice: Breast Milk and Formula  O2 Device: Room Air  Infant feeding assessment IDFS - Readiness: 1 IDFS - Quality: 2   Maternal data: Patricia Boyer C-Section, Low Transverse Pumping frequency: 2 times/24 hours Pumped volume: 60 mL  WIC Program: No WIC Referral Sent?: No Pump: Received Stork Pump, Hands Free, Personal (Spectra  S2 Plus and Mom Cozy wearable)  ASSESSMENT Infant: Feeding Status: Scheduled 8-11-2-5 Feeding method: Bottle Nipple Type: Dr. Jonna Fling Preemie  Maternal: Milk volume: Low  INTERVENTIONS/PLAN Interventions: Interventions: Breast feeding basics reviewed; Coconut oil; DEBP; Education  Plan: STS around care times Pump both breasts on maintain mode every 3 hours for 20-30 minutes,  ideally 8 pumping sessions/24 hours Ask for assistance when she'Patricia ready to take baby to breast again Parents will continue advancing on bottle feedings   Patricia Boyer present and supportive. All questions and concerns answered, family to contact [facility] services PRN.  Consult Status: NICU follow-up NICU Follow-up type: Weekly NICU follow up   Patricia Boyer Patricia Boyer 03/01/2024, 11:49 AM

## 2024-03-04 ENCOUNTER — Ambulatory Visit: Admitting: Cardiology

## 2024-03-04 ENCOUNTER — Encounter: Payer: Self-pay | Admitting: Cardiology

## 2024-03-04 VITALS — BP 130/102 | HR 74 | Ht 68.0 in | Wt 280.0 lb

## 2024-03-04 DIAGNOSIS — I10 Essential (primary) hypertension: Secondary | ICD-10-CM | POA: Diagnosis not present

## 2024-03-04 DIAGNOSIS — Z8759 Personal history of other complications of pregnancy, childbirth and the puerperium: Secondary | ICD-10-CM

## 2024-03-04 DIAGNOSIS — Z79899 Other long term (current) drug therapy: Secondary | ICD-10-CM

## 2024-03-04 DIAGNOSIS — O165 Unspecified maternal hypertension, complicating the puerperium: Secondary | ICD-10-CM

## 2024-03-04 LAB — COMPREHENSIVE METABOLIC PANEL WITH GFR
ALT: 15 IU/L (ref 0–32)
AST: 17 IU/L (ref 0–40)
Albumin: 4 g/dL (ref 3.9–4.9)
Alkaline Phosphatase: 116 IU/L (ref 41–116)
BUN/Creatinine Ratio: 14 (ref 9–23)
BUN: 14 mg/dL (ref 6–24)
Bilirubin Total: 0.4 mg/dL (ref 0.0–1.2)
CO2: 21 mmol/L (ref 20–29)
Calcium: 9.3 mg/dL (ref 8.7–10.2)
Chloride: 106 mmol/L (ref 96–106)
Creatinine, Ser: 1.02 mg/dL — ABNORMAL HIGH (ref 0.57–1.00)
Globulin, Total: 3 g/dL (ref 1.5–4.5)
Glucose: 77 mg/dL (ref 70–99)
Potassium: 4.4 mmol/L (ref 3.5–5.2)
Sodium: 140 mmol/L (ref 134–144)
Total Protein: 7 g/dL (ref 6.0–8.5)
eGFR: 70 mL/min/1.73

## 2024-03-04 LAB — MAGNESIUM: Magnesium: 2.1 mg/dL (ref 1.6–2.3)

## 2024-03-04 MED ORDER — LABETALOL HCL 300 MG PO TABS: 300.0000 mg | ORAL_TABLET | Freq: Two times a day (BID) | ORAL | 3 refills | Status: DC

## 2024-03-04 MED ORDER — LABETALOL HCL 300 MG PO TABS: 300.0000 mg | ORAL_TABLET | Freq: Three times a day (TID) | ORAL | 3 refills | Status: AC

## 2024-03-04 NOTE — Patient Instructions (Signed)
 Medication Instructions:  Please increase Labetalol  to 300 mg three times a day. Continue all other medications as listed.  *If you need a refill on your cardiac medications before your next appointment, please call your pharmacy*  Lab Work: Please have blood work today (CMP, Mg)  If you have labs (blood work) drawn today and your tests are completely normal, you will receive your results only by: MyChart Message (if you have MyChart) OR A paper copy in the mail If you have any lab test that is abnormal or we need to change your treatment, we will call you to review the results.  Follow-Up: At [facility], you and your health needs are our priority.  As part of our continuing mission to provide you with exceptional heart care, our providers are all part of one team.  This team includes your primary Cardiologist (physician) and Advanced Practice Providers or APPs (Physician Assistants and Nurse Practitioners) who all work together to provide you with the care you need, when you need it.  Your next appointment:   2 weeks as scheduled Provider:   Kardie Tobb, DO    We recommend signing up for the patient portal called MyChart.  Sign up information is provided on this After Visit Summary.  MyChart is used to connect with patients for Virtual Visits (Telemedicine).  Patients are able to view lab/test results, encounter notes, upcoming appointments, etc.  Non-urgent messages can be sent to your provider as well.   To learn more about what you can do with MyChart, go to [email or web address].

## 2024-03-05 ENCOUNTER — Telehealth (HOSPITAL_COMMUNITY): Payer: Self-pay | Admitting: *Deleted

## 2024-03-05 ENCOUNTER — Ambulatory Visit: Payer: Self-pay | Admitting: Cardiology

## 2024-03-05 NOTE — Progress Notes (Signed)
 "  Cardio-Obstetrics Clinic  Follow Up Note   Date:  03/05/2024   ID:  Patricia Boyer, DOB 1980/09/24, MRN [id number]  PCP:  Georgina Speaks, FNP   [facility] Providers Cardiologist:  Dub Huntsman, DO  Electrophysiologist:  None        Referring MD: Georgina Speaks, FNP   Chief Complaint:  I am doing well  History of Present Illness:    Patricia Boyer is a 44 y.o. female [G3P0121] who returns for follow up of chronic hypertension in pregnancy.   Medical hx includes Depression, Anxiety, chronic hypertension of pregnancy.   She is here today for a follow up.  She is postpartum, blood pressure not well controlled. She is looking forward to her baby being out of the NICU soon.   She plans to breastfeed.   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   folic acid  (FOLVITE ) 1 MG tablet Take 1 tablet (1 mg total) by mouth daily.   furosemide  (LASIX ) 40 MG tablet Take 1 tablet (40 mg total) by mouth 2 (two) times daily.   ibuprofen  (ADVIL ) 600 MG tablet Take 1 tablet (600 mg total) by mouth every 6 (six) hours as needed.   labetalol  (NORMODYNE ) 300 MG tablet Take 1 tablet  (300 mg total) by mouth 3 (three) times daily.   NIFEdipine  (PROCARDIA  XL/NIFEDICAL XL) 60 MG 24 hr tablet Take 1 tablet (60 mg total) by mouth daily.   [EXPIRED] oxyCODONE  (OXY IR/ROXICODONE ) 5 MG immediate release tablet Take 1 tablet (5 mg total) by mouth every 4 (four) hours as needed for up to 7 days for moderate pain (pain score 4-6).   potassium chloride  SA (KLOR-CON  M) 20 MEQ tablet Take 1 tablet (20 mEq total) by mouth 2 (two) times daily.   Prenatal Vit-Fe Fumarate-FA (MULTIVITAMIN-PRENATAL) 27-0.8 MG TABS tablet TAKE 1 TABLET BY MOUTH DAILY AT 12 NOON.   sertraline  (ZOLOFT ) 50 MG tablet Take 50 mg by mouth daily.   [DISCONTINUED] labetalol  (NORMODYNE ) 300 MG tablet Take 1 tablet (300 mg total) by mouth 2 (two) times daily.     Allergies:   Patient has no known allergies.   Social History   Socioeconomic History   Marital status: Single    Spouse name: Not on file   Number of children: Not on file  Years of education: Not on file   Highest education level: Doctorate  Occupational History   Not on file  Tobacco Use   Smoking status: Never   Smokeless tobacco: Never  Vaping Use   Vaping status: Never Used  Substance and Sexual Activity   Alcohol use: Not Currently    Alcohol/week: 1.0 standard drink of alcohol   Drug use: No   Sexual activity: Yes  Other Topics Concern   Not on file  Social History Narrative   Lives alone   Right handed   Caffeine : 1.5 cups/day   Social Drivers of Health   Tobacco Use: Low Risk (03/04/2024)   Patient History    Smoking Tobacco Use: Never    Smokeless Tobacco Use: Never    Passive Exposure: Not on file  Financial Resource Strain: Medium Risk (07/30/2023)   Overall Financial Resource Strain (CARDIA)    Difficulty of Paying Living Expenses: Somewhat hard  Food Insecurity: No Food Insecurity (02/22/2024)   Epic    Worried About Programme Researcher, Broadcasting/film/video in the Last Year: Never true    Ran Out of Food in the Last Year: Never true   Transportation Needs: No Transportation Needs (02/22/2024)   Epic    Lack of Transportation (Medical): No    Lack of Transportation (Non-Medical): No  Physical Activity: Insufficiently Active (07/30/2023)   Exercise Vital Sign    Days of Exercise per Week: 3 days    Minutes of Exercise per Session: 30 min  Stress: Stress Concern Present (07/30/2023)   [employer] of Occupational Health - Occupational Stress Questionnaire    Feeling of Stress: To some extent  Social Connections: Moderately Integrated (02/22/2024)   Social Connection and Isolation Panel    Frequency of Communication with Friends and Family: More than three times a week    Frequency of Social Gatherings with Friends and Family: Once a week    Attends Religious Services: Never    Database Administrator or Organizations: Yes    Attends Engineer, Structural: More than 4 times per year    Marital Status: Living with partner  Depression (PHQ2-9): Medium Risk (07/31/2023)   Depression (PHQ2-9)    PHQ-2 Score: 7  Alcohol Screen: Low Risk (07/30/2023)   Alcohol Screen    Last Alcohol Screening Score (AUDIT): 4  Housing: Low Risk (02/24/2024)   Epic    Unable to Pay for Housing in the Last Year: No    Number of Times Moved in the Last Year: 0    Homeless in the Last Year: No  Utilities: Not At Risk (02/22/2024)   Epic    Threatened with loss of utilities: No  Health Literacy: Not on file      Family History  Problem Relation Age of Onset   Hypertension Mother    Sleep apnea Mother    Arthritis Father    Vision loss Father    Diabetes Maternal Grandmother    Diabetes Maternal Uncle       ROS:   Please see the history of present illness.     All other systems reviewed and are negative.   Labs/EKG Reviewed:    EKG:   EKG was ordered today.  The ekg ordered today demonstrates sinus rhythm  Recent Labs: 09/18/2023: TSH 2.400 02/24/2024: Hemoglobin 11.6; Platelets 274 03/04/2024: ALT 15; BUN 14;  Creatinine, Ser 1.02; Magnesium  2.1; Potassium 4.4; Sodium 140   Recent Lipid Panel Lab Results  Component Value Date/Time  CHOL 200 (H) 09/18/2023 04:03 PM   TRIG 72 09/18/2023 04:03 PM   HDL 75 09/18/2023 04:03 PM   CHOLHDL 2.7 09/18/2023 04:03 PM   LDLCALC 112 (H) 09/18/2023 04:03 PM    Physical Exam:    VS:  BP (!) 130/102 (BP Location: Right Arm, Patient Position: Sitting, Cuff Size: Large)   Pulse 74   Ht 5' 8 (1.727 m)   Wt 280 lb (127 kg)   LMP 06/15/2023   SpO2 99%   BMI 42.57 kg/m     Wt Readings from Last 3 Encounters:  03/04/24 280 lb (127 kg)  02/22/24 298 lb 12.8 oz (135.5 kg)  01/17/24 289 lb 12.8 oz (131.5 kg)     GEN:  Well nourished, well developed in no acute distress HEENT: Normal NECK: No JVD; No carotid bruits LYMPHATICS: No lymphadenopathy CARDIAC: RRR, no murmurs, rubs, gallops RESPIRATORY:  Clear to auscultation without rales, wheezing or rhonchi  ABDOMEN: Soft, non-tender, non-distended MUSCULOSKELETAL:  No edema; No deformity  SKIN: Warm and dry NEUROLOGIC:  Alert and oriented x 3 PSYCHIATRIC:  Normal affect    Risk Assessment/Risk Calculators:                 ASSESSMENT & PLAN:    Postpartum hypertension - blood pressure not well controlled. Will increase her labetalol  to 300 mg TID. Continue current dose of nifedipine  60 mg daily.  Will get blood work today   She will follow up in the 2 weeks.    Patient Instructions  Medication Instructions:  Please increase Labetalol  to 300 mg three times a day. Continue all other medications as listed.  *If you need a refill on your cardiac medications before your next appointment, please call your pharmacy*  Lab Work: Please have blood work today (CMP, Mg)  If you have labs (blood work) drawn today and your tests are completely normal, you will receive your results only by: MyChart Message (if you have MyChart) OR A paper copy in the mail If you have any lab test that is  abnormal or we need to change your treatment, we will call you to review the results.  Follow-Up: At [facility], you and your health needs are our priority.  As part of our continuing mission to provide you with exceptional heart care, our providers are all part of one team.  This team includes your primary Cardiologist (physician) and Advanced Practice Providers or APPs (Physician Assistants and Nurse Practitioners) who all work together to provide you with the care you need, when you need it.  Your next appointment:   2 weeks as scheduled Provider:   Daylin Gruszka, DO    We recommend signing up for the patient portal called MyChart.  Sign up information is provided on this After Visit Summary.  MyChart is used to connect with patients for Virtual Visits (Telemedicine).  Patients are able to view lab/test results, encounter notes, upcoming appointments, etc.  Non-urgent messages can be sent to your provider as well.   To learn more about what you can do with MyChart, go to [email or web address].               Dispo:  No follow-ups on file.   Medication Adjustments/Labs and Tests Ordered: Current medicines are reviewed at length with the patient today.  Concerns regarding medicines are outlined above.  Tests Ordered: Orders Placed This Encounter  Procedures   Comprehensive metabolic panel with GFR   Magnesium    Medication Changes: Meds  ordered this encounter  Medications   DISCONTD: labetalol  (NORMODYNE ) 300 MG tablet    Sig: Take 1 tablet (300 mg total) by mouth 2 (two) times daily.    Dispense:  60 tablet    Refill:  3   labetalol  (NORMODYNE ) 300 MG tablet    Sig: Take 1 tablet (300 mg total) by mouth 3 (three) times daily.    Dispense:  90 tablet    Refill:  3   "

## 2024-03-05 NOTE — Telephone Encounter (Signed)
 03/05/2024  Name: Patricia Boyer MRN: [id number] DOB: November 20, 1980  Reason for Call:  Transition of Care Hospital Discharge Call  Contact Status: Patient Contact Status: Message  Language assistant needed:          Follow-Up Questions:    Van Postnatal Depression Scale:  In the Past 7 Days:    PHQ2-9 Depression Scale:     Discharge Follow-up:    Post-discharge interventions: NA  Maclean Foister,RN  03/05/2024 1553

## 2024-03-12 ENCOUNTER — Ambulatory Visit: Admitting: Psychology

## 2024-03-12 DIAGNOSIS — F3289 Other specified depressive episodes: Secondary | ICD-10-CM

## 2024-03-12 DIAGNOSIS — F418 Other specified anxiety disorders: Secondary | ICD-10-CM

## 2024-03-12 NOTE — Progress Notes (Signed)
 [facility] Counselor/Therapist Progress Note  Patient ID: Patricia Boyer, MRN: [id number]   Date: 03/12/24  Time Spent: 3:03 pm -  4:06 pm : 63 Minutes   Treatment Type: Individual Therapy.  Reported Symptoms: Depression and Anxiety.   Mental Status Exam: Appearance:  Casual     Behavior: Appropriate  Motor: Normal  Speech/Language:  Clear and Coherent  Affect: Congruent  Mood: anxious  Thought process: normal  Thought content:   WNL  Sensory/Perceptual disturbances:   WNL  Orientation: oriented to person, place, time/date, and situation  Attention: Good  Concentration: Good  Memory: WNL  Fund of knowledge:  Good  Insight:   Good  Judgment:  Good  Impulse Control: Good    Risk Assessment: Danger to Self:  No Self-injurious Behavior: No Danger to Others: No Duty to Warn:no Physical Aggression / Violence:No  Access to Firearms a concern: No  Gang Involvement:No   Subjective:   Patricia Boyer participated from home, via video and consented to treatment. Therapist participated from home office. Patricia Boyer reviewed the events of the past week. Patricia Boyer noted giving birth to her son but noted having to have an emergency C-section. Patricia Boyer noted her pregnancy being high risk. Patricia Boyer noted her son being born four weeks early and weighing 3 pounds. Patricia Boyer noted being gaining 1 lb before being released from the NICU to return home. Patricia Boyer noted the events leading up to her pregnancy and after delivery. Patricia Boyer noted this being overwhelming and expensive. Patricia Boyer noted developing other health complications. Patricia Boyer noted various barriers regarding work and being out of work. Patricia Boyer noted this process being stressful and confusing. Patricia Boyer noted now being on disability. Patricia Boyer noted the stressors of being a new born parent and noted feeling lethargic. Patricia Boyer noted anxiety related work, being asked to share personal information, and worry that some issue with her paperwork will arise. Patricia Boyer noted a previous  conversation, with her supervisor, and illuminated unhealthy dynamics within the workplace specifically noting a coworker's negative influence on Patricia Boyer and her supervisor's relationship and Patricia Boyer's standing at work. We worked on exploring this during the session. We worked on feeling identification during the session. Patricia Boyer noted her intent to attend sessions more regularly and noted that this would be helpful in receiving more support and addressing her mood. Therapist validated Patricia Boyer's feelings and experience, praised Patricia Boyer for her mindfulness regarding her mental health needs. Patricia Boyer noted a rise in relationship stressors shortly after the birth of her son. We worked on processing this during the session and Patricia Boyer noted  feelings of frustration and hurt. We worked on processing these experiences and therapist reviewed fair fighting rules, communication, and encouraged Patricia Boyer and her partner to discuss needs and have weekly check-ins during the session. Patricia Boyer was receptive to this during the session. Therapist praised Patricia Boyer for her effort and provided supportive therapy. A Follow-up was scheduled for continued treatment, which Patricia Boyer benefits from. We will complete the annual reevaluation during the follow-up.     Diagnosis:  Other depression  Other specified anxiety disorders  Intervention:  CBT Interpersonal  Treatment Plan:  Client Abilities/Strengths Patricia Boyer is intelligent, well educated, and motivated for change.   Support System: Family and friends.   Client Treatment Preferences Outpatient Therapy  Client Statement of Needs Patricia Boyer would like to work on managing day-to-day stressors, boundaries for self and others, managing rumination, work and home-life balance, identifying areas of control and lack of control, managing distress tolerance at work, reducing personalizing at work, assertiveness,  focusing on positives, managing symptoms, processing past events, verbalize  feelings, engaging in mindfulness, process past events, engaging in self-care, improve self-esteem, and maintain consistency.   Treatment Level Weekly  Symptoms  Depression:  loss of interest, feeling down, poor sleep, lethargy, fluctuating appetite, feeling bad about self,  and difficulty concentrating.  (Status: maintained) Anxiety: Feeling anxious, difficulty managing worry, worrying about different things, trouble relaxing, irritability.  (Status: maintained)  Goals:   Patricia Boyer experiences symptoms of depression and anxiety.    Target Date: 03/13/24 Frequency: Weekly  Progress: 10% Modality: individual    Therapist will provide referrals for additional resources as appropriate.  Therapist will provide psycho-education regarding Patricia Boyer's diagnosis and corresponding treatment approaches and interventions. Licensed Clinical Social Worker, [place], LCSW will support the patient's ability to achieve the goals identified. will employ CBT, BA, Problem-solving, Solution Focused, Mindfulness,  coping skills, & other evidenced-based practices will be used to promote progress towards healthy functioning to help manage decrease symptoms associated with her diagnosis.   Reduce overall level, frequency, and intensity of the feelings of depression, anxiety and panic evidenced by decreased overall symptoms from 6 to 7 days/week to 0 to 1 days/week per client report for at least 3 consecutive months. Verbally express understanding of the relationship between feelings of depression, anxiety and their impact on thinking patterns and behaviors. Verbalize an understanding of the role that distorted thinking plays in creating fears, excessive worry, and ruminations.    Patricia Boyer participated in the creation of the treatment plan)   Patricia Mullet, LCSW

## 2024-03-18 ENCOUNTER — Telehealth: Payer: Self-pay | Admitting: Cardiology

## 2024-03-18 NOTE — Telephone Encounter (Signed)
 [facility] Nurse calling to state that patients blood pressure was 110/76 at home visit yesterday. Emony was happy with this and wanted Dr. Sheena to know.

## 2024-03-19 ENCOUNTER — Encounter: Payer: Self-pay | Admitting: Cardiology

## 2024-03-19 ENCOUNTER — Ambulatory Visit: Admitting: Cardiology

## 2024-03-19 NOTE — Progress Notes (Unsigned)
 "  Cardio-Obstetrics Clinic  Follow Up Note   Date:  03/19/2024   ID:  Patricia Boyer, DOB 28-Nov-1980, MRN [id number]  PCP:  Georgina Speaks, FNP   [facility] Providers Cardiologist:  Dub Huntsman, DO  Electrophysiologist:  None        Referring MD: Georgina Speaks, FNP   Chief Complaint:  I am doing well  History of Present Illness:    Patricia Boyer is a 44 y.o. female [G3P0121] who returns for follow up of chronic hypertension in pregnancy.   Medical hx includes Depression, Anxiety, chronic hypertension of pregnancy.   She is 3 weeks postpartum doing well overall.  Her son is home now he is feeding well. 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   folic acid  (FOLVITE ) 1 MG tablet Take 1 tablet (1 mg total) by mouth daily.   furosemide  (LASIX ) 40 MG tablet Take 1 tablet (40 mg total) by mouth 2 (two) times daily.   ibuprofen  (ADVIL ) 600 MG tablet Take 1 tablet (600 mg total) by mouth every 6 (six) hours as needed.   labetalol  (NORMODYNE ) 300 MG tablet Take 1 tablet (300 mg total) by mouth 3 (three) times daily.   NIFEdipine   (PROCARDIA  XL/NIFEDICAL XL) 60 MG 24 hr tablet Take 1 tablet (60 mg total) by mouth daily.   potassium chloride  SA (KLOR-CON  M) 20 MEQ tablet Take 1 tablet (20 mEq total) by mouth 2 (two) times daily.   Prenatal Vit-Fe Fumarate-FA (MULTIVITAMIN-PRENATAL) 27-0.8 MG TABS tablet TAKE 1 TABLET BY MOUTH DAILY AT 12 NOON.   sertraline  (ZOLOFT ) 50 MG tablet Take 50 mg by mouth daily.     Allergies:   Patient has no known allergies.   Social History   Socioeconomic History   Marital status: Single    Spouse name: Not on file   Number of children: Not on file   Years of education: Not on file   Highest education level: Doctorate  Occupational History   Not on file  Tobacco Use   Smoking status: Never   Smokeless tobacco: Never  Vaping Use   Vaping status: Never Used  Substance and Sexual Activity   Alcohol use: Not Currently    Alcohol/week: 1.0 standard drink of alcohol   Drug use: No  Sexual activity: Yes  Other Topics Concern   Not on file  Social History Narrative   Lives alone   Right handed   Caffeine : 1.5 cups/day   Social Drivers of Health   Tobacco Use: Low Risk (03/19/2024)   Patient History    Smoking Tobacco Use: Never    Smokeless Tobacco Use: Never    Passive Exposure: Not on file  Financial Resource Strain: Medium Risk (07/30/2023)   Overall Financial Resource Strain (CARDIA)    Difficulty of Paying Living Expenses: Somewhat hard  Food Insecurity: No Food Insecurity (02/22/2024)   Epic    Worried About Programme Researcher, Broadcasting/film/video in the Last Year: Never true    Ran Out of Food in the Last Year: Never true  Transportation Needs: No Transportation Needs (02/22/2024)   Epic    Lack of Transportation (Medical): No    Lack of Transportation (Non-Medical): No  Physical Activity: Insufficiently Active (07/30/2023)   Exercise Vital Sign    Days of Exercise per Week: 3 days    Minutes of Exercise per Session: 30 min  Stress: Stress Concern Present (07/30/2023)   [employer] of Occupational Health - Occupational Stress Questionnaire    Feeling of Stress: To some extent  Social Connections: Moderately Integrated (02/22/2024)   Social Connection and Isolation Panel    Frequency of Communication with Friends and Family: More than three times a week    Frequency of Social Gatherings with Friends and Family: Once a week    Attends Religious Services: Never    Database Administrator or Organizations: Yes    Attends Engineer, Structural: More than 4 times per year    Marital Status: Living with partner  Depression (PHQ2-9): Medium Risk (07/31/2023)   Depression (PHQ2-9)    PHQ-2 Score: 7  Alcohol Screen: Low Risk (07/30/2023)   Alcohol Screen    Last Alcohol Screening Score (AUDIT): 4  Housing: Low Risk (02/24/2024)   Epic    Unable to Pay for Housing in the Last Year: No    Number of Times Moved in the Last Year: 0    Homeless in the Last Year: No  Utilities: Not At Risk (02/22/2024)   Epic    Threatened with loss of utilities: No  Health Literacy: Not on file      Family History  Problem Relation Age of Onset   Hypertension Mother    Sleep apnea Mother    Arthritis Father    Vision loss Father    Diabetes Maternal Grandmother    Diabetes Maternal Uncle       ROS:   Please see the history of present illness.     All other systems reviewed and are negative.   Labs/EKG Reviewed:    EKG:   EKG was not ordered today.    Recent Labs: 09/18/2023: TSH 2.400 02/24/2024: Hemoglobin 11.6; Platelets 274 03/04/2024: ALT 15; BUN 14; Creatinine, Ser 1.02; Magnesium  2.1; Potassium 4.4; Sodium 140   Recent Lipid Panel Lab Results  Component Value Date/Time   CHOL 200 (H) 09/18/2023 04:03 PM   TRIG 72 09/18/2023 04:03 PM   HDL 75 09/18/2023 04:03 PM   CHOLHDL 2.7 09/18/2023 04:03 PM   LDLCALC 112 (H) 09/18/2023 04:03 PM    Physical Exam:    VS:  BP (!) 126/94 (BP Location: Right Arm, Patient Position: Sitting, Cuff Size: Large)    Pulse (!) 53   Ht 5' 8 (1.727 m)   Wt 280  lb 6.4 oz (127.2 kg)   LMP 06/15/2023   SpO2 97%   BMI 42.63 kg/m     Wt Readings from Last 3 Encounters:  03/19/24 280 lb 6.4 oz (127.2 kg)  03/04/24 280 lb (127 kg)  02/22/24 298 lb 12.8 oz (135.5 kg)     GEN:  Well nourished, well developed in no acute distress HEENT: Normal NECK: No JVD; No carotid bruits LYMPHATICS: No lymphadenopathy CARDIAC: RRR, no murmurs, rubs, gallops RESPIRATORY:  Clear to auscultation without rales, wheezing or rhonchi  ABDOMEN: Soft, non-tender, non-distended MUSCULOSKELETAL:  No edema; No deformity  SKIN: Warm and dry NEUROLOGIC:  Alert and oriented x 3 PSYCHIATRIC:  Normal affect    Risk Assessment/Risk Calculators:           { Click for CHADS2VASc Score - THEN Refresh Note    :[id number]}      ASSESSMENT & PLAN:    Postpartum hypertension - blood pressure has improved but still not quite to target. She has had some bp below target at home. She will take her blood pressure daily and send information via MyChart in 2 weeks.  Hyperlipidemia-she is still under 3 months postpartum so we will hold off on repeating her blood work.  The patient understands the need to lose weight with diet and exercise. We have discussed specific strategies for this.  She will follow-up in 6 to 8 weeks   There are no Patient Instructions on file for this visit.   Dispo:  No follow-ups on file.   Medication Adjustments/Labs and Tests Ordered: Current medicines are reviewed at length with the patient today.  Concerns regarding medicines are outlined above.  Tests Ordered: No orders of the defined types were placed in this encounter.  Medication Changes: No orders of the defined types were placed in this encounter.  "

## 2024-03-19 NOTE — Patient Instructions (Signed)
 Medication Instructions:  Your physician recommends that you continue on your current medications as directed. Please refer to the Current Medication list given to you today.  *If you need a refill on your cardiac medications before your next appointment, please call your pharmacy*  Please take your blood pressure daily for 2 weeks and send in a MyChart message. Please include heart rates. (One message at the end of the 2 weeks).   HOW TO TAKE YOUR BLOOD PRESSURE: Rest 5 minutes before taking your blood pressure. Dont smoke or drink caffeinated beverages for at least 30 minutes before. Take your blood pressure before (not after) you eat. Sit comfortably with your back supported and both feet on the floor (dont cross your legs). Elevate your arm to heart level on a table or a desk. Use the proper sized cuff. It should fit smoothly and snugly around your bare upper arm. There should be enough room to slip a fingertip under the cuff. The bottom edge of the cuff should be 1 inch above the crease of the elbow. Ideally, take 3 measurements at one sitting and record the average.  Blood Pressure Log Date/Time Medications taken? (Y/N) Blood Pressure Heart Rate/Pulse                                                                                                                                                                                          Follow-Up: At [facility], you and your health needs are our priority.  As part of our continuing mission to provide you with exceptional heart care, our providers are all part of one team.  This team includes your primary Cardiologist (physician) and Advanced Practice Providers or APPs (Physician Assistants and Nurse Practitioners) who all work together to provide you with the care you need, when you need it.  Your next appointment:   16 week(s)  Provider:   Kardie Tobb, DO    Other Instructions:

## 2024-03-27 ENCOUNTER — Ambulatory Visit: Admitting: Psychology

## 2024-07-03 ENCOUNTER — Ambulatory Visit: Admitting: Cardiology

## 2024-09-21 ENCOUNTER — Encounter: Payer: Self-pay | Admitting: Nurse Practitioner
